# Patient Record
Sex: Female | Born: 1940 | Race: White | Hispanic: No | Marital: Married | Smoking: Never smoker
Health system: Southern US, Community
[De-identification: ages and names within clinical notes are randomized; demographics above are authoritative.]

## PROBLEM LIST (undated history)

## (undated) DIAGNOSIS — K2100 Gastro-esophageal reflux disease with esophagitis, without bleeding: Secondary | ICD-10-CM

## (undated) DIAGNOSIS — N3010 Interstitial cystitis (chronic) without hematuria: Secondary | ICD-10-CM

## (undated) DIAGNOSIS — Z860100 Personal history of colon polyps, unspecified: Secondary | ICD-10-CM

## (undated) DIAGNOSIS — K449 Diaphragmatic hernia without obstruction or gangrene: Secondary | ICD-10-CM

## (undated) DIAGNOSIS — I350 Nonrheumatic aortic (valve) stenosis: Secondary | ICD-10-CM

## (undated) DIAGNOSIS — I10 Essential (primary) hypertension: Secondary | ICD-10-CM

## (undated) DIAGNOSIS — E039 Hypothyroidism, unspecified: Secondary | ICD-10-CM

## (undated) DIAGNOSIS — M810 Age-related osteoporosis without current pathological fracture: Secondary | ICD-10-CM

## (undated) DIAGNOSIS — Z952 Presence of prosthetic heart valve: Secondary | ICD-10-CM

## (undated) DIAGNOSIS — E7800 Pure hypercholesterolemia, unspecified: Secondary | ICD-10-CM

## (undated) DIAGNOSIS — F419 Anxiety disorder, unspecified: Secondary | ICD-10-CM

## (undated) DIAGNOSIS — E559 Vitamin D deficiency, unspecified: Secondary | ICD-10-CM

## (undated) HISTORY — DX: Pure hypercholesterolemia, unspecified: E78.00

## (undated) HISTORY — PX: CHOLECYSTECTOMY: SHX55

## (undated) HISTORY — DX: Personal history of colonic polyps: Z86.010

## (undated) HISTORY — DX: Age-related osteoporosis without current pathological fracture: M81.0

## (undated) HISTORY — DX: Gastro-esophageal reflux disease with esophagitis, without bleeding: K21.00

## (undated) HISTORY — DX: Anxiety disorder, unspecified: F41.9

## (undated) HISTORY — DX: Vitamin D deficiency, unspecified: E55.9

## (undated) HISTORY — DX: Diaphragmatic hernia without obstruction or gangrene: K44.9

## (undated) HISTORY — DX: Gastro-esophageal reflux disease with esophagitis: K21.0

## (undated) HISTORY — DX: Essential (primary) hypertension: I10

## (undated) HISTORY — DX: Interstitial cystitis (chronic) without hematuria: N30.10

## (undated) HISTORY — PX: TUBAL LIGATION: SHX77

## (undated) HISTORY — PX: FOOT SURGERY: SHX648

## (undated) HISTORY — DX: Hypothyroidism, unspecified: E03.9

## (undated) HISTORY — DX: Personal history of colon polyps, unspecified: Z86.0100

---

## 2011-09-18 ENCOUNTER — Encounter: Payer: Self-pay | Admitting: Gastroenterology

## 2011-09-18 HISTORY — PX: COLONOSCOPY: SHX174

## 2013-02-04 ENCOUNTER — Encounter (INDEPENDENT_AMBULATORY_CARE_PROVIDER_SITE_OTHER): Payer: Self-pay

## 2013-02-04 ENCOUNTER — Ambulatory Visit (INDEPENDENT_AMBULATORY_CARE_PROVIDER_SITE_OTHER): Payer: Medicare PPO

## 2013-02-04 DIAGNOSIS — M2040 Other hammer toe(s) (acquired), unspecified foot: Secondary | ICD-10-CM

## 2013-02-04 DIAGNOSIS — M79609 Pain in unspecified limb: Secondary | ICD-10-CM

## 2013-02-04 DIAGNOSIS — M2042 Other hammer toe(s) (acquired), left foot: Secondary | ICD-10-CM

## 2013-02-04 NOTE — Progress Notes (Signed)
  Subjective:    Patient ID: Shannon Mills, female    DOB: [birth year], 72 y.o.   MRN: [id number]  HPI left 2nd toe and has been a year and curling down and no burning and no throbing  Patient has had previous hammertoe repair of the second digit right foot with good success. Recently is noted she has a long second toe on the right foot which is curling down at the distal IP joint. Not painful or symptomatic. However may be irritated in certain shoes.   Review of Systems  HENT: Positive for sinus pressure.   Endocrine: Positive for polyuria.  Neurological: Positive for headaches.       Objective:   Physical Exam Neurovascular status is intact. Pedal pulses palpable DP and PT +2/4 bilateral. Skin color normal. Hair growth diminished absent bilateral. Mild varicosities noted bilateral. No edema noted. Epicritic and proprioceptive sensations intact and symmetric bilateral. Nails are normal. Orthopedic exam reveals rectus hallux with some slight deviation mild bunion. 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 Confirmed with x-ray evaluation. Recommend accommodative shoes and she is currently wearing. Dispensed some tube foam padding. Followup in the future an as-needed basis.  Alvan Dame DPM

## 2013-02-04 NOTE — Patient Instructions (Signed)
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 Currently the toes flexible and nonpainful. This is a variation of water often termed as hammertoe deformities. These are often hereditary nature, and can worsen over time. Although not caused by shoes, certain shoes can aggravate the condition. If the toe becomes painful or symptomatic further evaluation and possible surgery may be warranted.

## 2015-06-19 DIAGNOSIS — N3010 Interstitial cystitis (chronic) without hematuria: Secondary | ICD-10-CM | POA: Insufficient documentation

## 2015-06-19 HISTORY — DX: Interstitial cystitis (chronic) without hematuria: N30.10

## 2016-12-16 DIAGNOSIS — I350 Nonrheumatic aortic (valve) stenosis: Secondary | ICD-10-CM | POA: Diagnosis not present

## 2017-05-05 DIAGNOSIS — B029 Zoster without complications: Secondary | ICD-10-CM | POA: Diagnosis not present

## 2017-05-05 DIAGNOSIS — Z6826 Body mass index (BMI) 26.0-26.9, adult: Secondary | ICD-10-CM | POA: Diagnosis not present

## 2017-05-05 DIAGNOSIS — I350 Nonrheumatic aortic (valve) stenosis: Secondary | ICD-10-CM | POA: Diagnosis not present

## 2017-05-05 DIAGNOSIS — E785 Hyperlipidemia, unspecified: Secondary | ICD-10-CM | POA: Diagnosis not present

## 2017-05-05 DIAGNOSIS — F419 Anxiety disorder, unspecified: Secondary | ICD-10-CM | POA: Diagnosis not present

## 2017-05-05 DIAGNOSIS — E063 Autoimmune thyroiditis: Secondary | ICD-10-CM | POA: Diagnosis not present

## 2017-05-05 DIAGNOSIS — N3010 Interstitial cystitis (chronic) without hematuria: Secondary | ICD-10-CM | POA: Diagnosis not present

## 2017-05-05 DIAGNOSIS — E559 Vitamin D deficiency, unspecified: Secondary | ICD-10-CM | POA: Diagnosis not present

## 2017-05-05 DIAGNOSIS — K210 Gastro-esophageal reflux disease with esophagitis: Secondary | ICD-10-CM | POA: Diagnosis not present

## 2017-05-05 DIAGNOSIS — I10 Essential (primary) hypertension: Secondary | ICD-10-CM | POA: Diagnosis not present

## 2017-09-14 DIAGNOSIS — R05 Cough: Secondary | ICD-10-CM | POA: Diagnosis not present

## 2017-09-14 DIAGNOSIS — J189 Pneumonia, unspecified organism: Secondary | ICD-10-CM | POA: Diagnosis not present

## 2017-09-14 DIAGNOSIS — M940 Chondrocostal junction syndrome [Tietze]: Secondary | ICD-10-CM | POA: Diagnosis not present

## 2017-09-25 DIAGNOSIS — J189 Pneumonia, unspecified organism: Secondary | ICD-10-CM | POA: Diagnosis not present

## 2017-09-25 DIAGNOSIS — Z1331 Encounter for screening for depression: Secondary | ICD-10-CM | POA: Diagnosis not present

## 2017-09-25 DIAGNOSIS — Z9181 History of falling: Secondary | ICD-10-CM | POA: Diagnosis not present

## 2017-09-25 DIAGNOSIS — J9801 Acute bronchospasm: Secondary | ICD-10-CM | POA: Diagnosis not present

## 2017-09-25 DIAGNOSIS — E785 Hyperlipidemia, unspecified: Secondary | ICD-10-CM | POA: Diagnosis not present

## 2017-09-25 DIAGNOSIS — E063 Autoimmune thyroiditis: Secondary | ICD-10-CM | POA: Diagnosis not present

## 2017-10-28 DIAGNOSIS — Z01419 Encounter for gynecological examination (general) (routine) without abnormal findings: Secondary | ICD-10-CM | POA: Diagnosis not present

## 2017-10-28 DIAGNOSIS — Z1239 Encounter for other screening for malignant neoplasm of breast: Secondary | ICD-10-CM | POA: Diagnosis not present

## 2017-11-21 DIAGNOSIS — Z1231 Encounter for screening mammogram for malignant neoplasm of breast: Secondary | ICD-10-CM | POA: Diagnosis not present

## 2017-12-17 DIAGNOSIS — H66009 Acute suppurative otitis media without spontaneous rupture of ear drum, unspecified ear: Secondary | ICD-10-CM | POA: Diagnosis not present

## 2018-01-07 DIAGNOSIS — Z23 Encounter for immunization: Secondary | ICD-10-CM | POA: Diagnosis not present

## 2018-01-25 DIAGNOSIS — J209 Acute bronchitis, unspecified: Secondary | ICD-10-CM | POA: Diagnosis not present

## 2018-01-25 DIAGNOSIS — H6122 Impacted cerumen, left ear: Secondary | ICD-10-CM | POA: Diagnosis not present

## 2018-01-29 ENCOUNTER — Encounter: Payer: Self-pay | Admitting: Cardiology

## 2018-01-29 ENCOUNTER — Ambulatory Visit (INDEPENDENT_AMBULATORY_CARE_PROVIDER_SITE_OTHER): Payer: Medicare Other | Admitting: Cardiology

## 2018-01-29 VITALS — BP 142/80 | HR 62 | Ht 62.5 in | Wt 154.0 lb

## 2018-01-29 DIAGNOSIS — R5383 Other fatigue: Secondary | ICD-10-CM

## 2018-01-29 DIAGNOSIS — I10 Essential (primary) hypertension: Secondary | ICD-10-CM

## 2018-01-29 DIAGNOSIS — I350 Nonrheumatic aortic (valve) stenosis: Secondary | ICD-10-CM | POA: Diagnosis not present

## 2018-01-29 DIAGNOSIS — E782 Mixed hyperlipidemia: Secondary | ICD-10-CM | POA: Diagnosis not present

## 2018-01-29 DIAGNOSIS — I700 Atherosclerosis of aorta: Secondary | ICD-10-CM | POA: Diagnosis not present

## 2018-01-29 DIAGNOSIS — R0602 Shortness of breath: Secondary | ICD-10-CM | POA: Diagnosis not present

## 2018-01-29 DIAGNOSIS — I209 Angina pectoris, unspecified: Secondary | ICD-10-CM | POA: Diagnosis not present

## 2018-01-29 HISTORY — DX: Essential (primary) hypertension: I10

## 2018-01-29 NOTE — H&P (View-Only) (Signed)
Cardiology Office Note:    Date:  01/29/2018   ID:  Shannon Mills, DOB [birth year], MRN [id number]  PCP:  [name]., MD  Cardiologist:  Jenean Lindau, MD   Referring MD: [name]., MD    ASSESSMENT:    1. Angina pectoris ([place])   2. Fatigue, unspecified type   3. Aortic valve stenosis, etiology of cardiac valve disease unspecified   4. Essential hypertension   5. Mixed dyslipidemia    PLAN:    In order of problems listed above:  1. Primary prevention stressed with the patient.  Importance of compliance with diet and medication stressed and she vocalized understanding.  Her blood pressure is stable. 2. Diet was discussed with dyslipidemia. 3. Her symptoms are very concerning to me.  She has had a stress test earlier in the past.In view of the patient's symptoms, I discussed with the patient options for evaluation. Invasive and noninvasive options were given to the patient. I discussed stress testing and coronary angiography and left heart catheterization at length. Benefits, pros and cons of each approach were discussed at length. Patient had multiple questions which were answered to the patient's satisfaction. Patient opted for invasive evaluation and we will set up for coronary angiography and left heart catheterization. Further recommendations will be made based on the findings with coronary angiography. In the interim if the patient has any significant symptoms in hospital to the nearest emergency room. 4. I think coronary angiography and left heart catheterization will help to assess that she has issues with the aortic valve and coexistent coronary artery disease causing the symptoms.  This will help clarify however evaluation and diagnosis in treating this patient's symptoms.  In view of the fact that she has significant aortic stenosis and advanced age I have not given her nitroglycerin prescription.  I told her not to exert herself until the aforementioned test.  Just go to  the nearest emergency room for any concerning symptoms.   Medication Adjustments/Labs and Tests Ordered: Current medicines are reviewed at length with the patient today.  Concerns regarding medicines are outlined above.  No orders of the defined types were placed in this encounter.  No orders of the defined types were placed in this encounter.    History of Present Illness:    Shannon Mills is a 77 y.o. female who is being seen today for the evaluation of chest pain on exertion suggesting angina and cardiac murmur at the request of [name]., MD.  Patient is a pleasant 77 year old female.  She has past medical history of essential hypertension, dyslipidemia and cardiac murmur.  She mentions to me that she has chest tightness on exertion which curtails her effort and shortness of breath also.  Her symptoms were getting worse and therefore she wanted to be evaluated by a cardiologist.  At the time of my evaluation, the patient is alert awake oriented and in no distress.  Past Medical History:  Diagnosis Date  . Anxiety   . Aortic stenosis   . Bladder problem   . Chronic reflux esophagitis   . Hypercholesterolemia   . Hypertension   . Hypothyroidism   . Osteoporosis   . Systolic murmur   . Vitamin D deficiency     Past Surgical History:  Procedure Laterality Date  . CHOLECYSTECTOMY    . FOOT SURGERY    . TUBAL LIGATION      Current Medications: Current Meds  Medication Sig  . aspirin EC 81 MG  tablet Take 81 mg by mouth daily.  Marland Kitchen escitalopram (LEXAPRO) 10 MG tablet Take 10 mg by mouth daily.  Marland Kitchen imipramine (TOFRANIL) 25 MG tablet Take 25 mg by mouth at bedtime.  Marland Kitchen levothyroxine (SYNTHROID, LEVOTHROID) 100 MCG tablet Take 0.05 mcg by mouth daily before breakfast.  . metoprolol succinate (TOPROL-XL) 25 MG 24 hr tablet Take 1 tablet by mouth daily.  . rosuvastatin (CRESTOR) 40 MG tablet Take 40 mg by mouth daily.  . [DISCONTINUED] imipramine (TOFRANIL) 25 MG tablet Take 25 mg  by mouth at bedtime.  . [DISCONTINUED] pentosan polysulfate (ELMIRON) 100 MG capsule Take 100 mg by mouth 3 (three) times daily.     Allergies:   Levaquin [levofloxacin]   Social History   Socioeconomic History  . Marital status: Married    Spouse name: Not on file  . Number of children: Not on file  . Years of education: Not on file  . Highest education level: Not on file  Occupational History  . Not on file  Social Needs  . Financial resource strain: Not on file  . Food insecurity:    Worry: Not on file    Inability: Not on file  . Transportation needs:    Medical: Not on file    Non-medical: Not on file  Tobacco Use  . Smoking status: Never Smoker  . Smokeless tobacco: Never Used  Substance and Sexual Activity  . Alcohol use: No  . Drug use: No  . Sexual activity: Not on file  Lifestyle  . Physical activity:    Days per week: Not on file    Minutes per session: Not on file  . Stress: Not on file  Relationships  . Social connections:    Talks on phone: Not on file    Gets together: Not on file    Attends religious service: Not on file    Active member of club or organization: Not on file    Attends meetings of clubs or organizations: Not on file    Relationship status: Not on file  Other Topics Concern  . Not on file  Social History Narrative  . Not on file     Family History: The patient's family history includes COPD in her father; Cancer in her brother; Dementia in her mother; Hypertension in her father.  ROS:   Please see the history of present illness.    All other systems reviewed and are negative.  EKGs/Labs/Other Studies Reviewed:    The following studies were reviewed today: EKG reveals sinus rhythm and nonspecific ST-T changes.  Echocardiogram done on 12/09/2016 reveals mild concentric hypertrophy with normal systolic function and moderate stenosis with a mean gradient of 35 mmHg and mild aortic insufficiency.   Recent Labs: No results found  for requested labs within last 8760 hours.  Recent Lipid Panel No results found for: CHOL, TRIG, HDL, CHOLHDL, VLDL, LDLCALC, LDLDIRECT  Physical Exam:    VS:  BP (!) 142/80 (BP Location: Right Arm, Patient Position: Sitting, Cuff Size: Normal)   Pulse 62   Ht 5' 2.5" (1.588 m)   Wt 154 lb (69.9 kg)   SpO2 98%   BMI 27.72 kg/m     Wt Readings from Last 3 Encounters:  01/29/18 154 lb (69.9 kg)     GEN: Patient is in no acute distress HEENT: Normal NECK: No JVD; No carotid bruits LYMPHATICS: No lymphadenopathy CARDIAC: S1 S2 regular, 2/6 systolic murmur at the apex. RESPIRATORY:  Clear to auscultation without rales,  wheezing or rhonchi  ABDOMEN: Soft, non-tender, non-distended MUSCULOSKELETAL:  No edema; No deformity  SKIN: Warm and dry NEUROLOGIC:  Alert and oriented x 3 PSYCHIATRIC:  Normal affect    Signed, Jenean Lindau, MD  01/29/2018 3:09 PM    [facility]

## 2018-01-29 NOTE — Addendum Note (Signed)
Addended by: Mattie Marlin on: 01/29/2018 03:29 PM   Modules accepted: Orders

## 2018-01-29 NOTE — Patient Instructions (Signed)
Medication Instructions:  Your physician recommends that you continue on your current medications as directed. Please refer to the Current Medication list given to you today.  If you need a refill on your cardiac medications before your next appointment, please call your pharmacy.   Lab work: Your physician recommends that you have the following labs drawn: BMP and CBC today.  If you have labs (blood work) drawn today and your tests are completely normal, you will receive your results only by: Marland Kitchen MyChart Message (if you have MyChart) OR . A paper copy in the mail If you have any lab test that is abnormal or we need to change your treatment, we will call you to review the results.  Testing/Procedures: A chest x-ray takes a picture of the organs and structures inside the chest, including the heart, lungs, and blood vessels. This test can show several things, including, whether the heart is enlarges; whether fluid is building up in the lungs; and whether pacemaker / defibrillator leads are still in place.     [place] [address] Dept: [phone] Loc: [phone]  Shannon Mills  01/29/2018  You are scheduled for a Cardiac Catheterization on Tuesday, October 15 with Dr. Peter Martinique.  1. Please arrive at the [facility] (Main Entrance A) at [facility]: [address] [place], [place] [address] at 5:30 AM (This time is two hours before your procedure to ensure your preparation). Free valet parking service is available.   Special note: Every effort is made to have your procedure done on time. Please understand that emergencies sometimes delay scheduled procedures.  2. Diet: Do not eat solid foods after midnight.  The patient may have clear liquids until 5am upon the day of the procedure.  3. Labs: Done today.  4. Medication instructions in preparation for your  procedure:   Contrast Allergy: No  On the morning of your procedure, take your Aspirin and any morning medicines NOT listed above.  You may use sips of water.  5. Plan for one night stay--bring personal belongings. 6. Bring a current list of your medications and current insurance cards. 7. You MUST have a responsible person to drive you home. 8. Someone MUST be with you the first 24 hours after you arrive home or your discharge will be delayed. 9. Please wear clothes that are easy to get on and off and wear slip-on shoes.  Thank you for allowing [place] to care for you!   -- [facility]   Follow-Up: At [facility], you and your health needs are our priority.  As part of our continuing mission to provide you with exceptional heart care, we have created designated Provider Care Teams.  These Care Teams include your primary Cardiologist (physician) and Advanced Practice Providers (APPs -  Physician Assistants and Nurse Practitioners) who all work together to provide you with the care you need, when you need it.  You will need a follow up appointment in 4 weeks.  Please call our office 2 months in advance to schedule this appointment.  You may see another member of our [employer] Provider Team in [place]: Jenne Campus, MD . [name], MD  Any Other Special Instructions Will Be Listed Below (If Applicable).   Coronary Angiogram With Stent Coronary angiogram with stent placement is a procedure to widen or open a narrow blood vessel of the heart (coronary artery). Arteries  may become blocked by cholesterol buildup (plaques) in the lining of the wall. When a coronary artery becomes partially blocked, blood flow to that area decreases. This may lead to chest pain or a heart attack (myocardial infarction). A stent is a small piece of metal that looks like mesh or a spring. Stent placement may be done as treatment for a heart attack or right after a  coronary angiogram in which a blocked artery is found. Let your health care provider know about:  Any allergies you have.  All medicines you are taking, including vitamins, herbs, eye drops, creams, and over-the-counter medicines.  Any problems you or family members have had with anesthetic medicines.  Any blood disorders you have.  Any surgeries you have had.  Any medical conditions you have.  Whether you are pregnant or may be pregnant. What are the risks? Generally, this is a safe procedure. However, problems may occur, including:  Damage to the heart or its blood vessels.  A return of blockage.  Bleeding, infection, or bruising at the insertion site.  A collection of blood under the skin (hematoma) at the insertion site.  A blood clot in another part of the body.  Kidney injury.  Allergic reaction to the dye or contrast that is used.  Bleeding into the abdomen (retroperitoneal bleeding).  What happens before the procedure? Staying hydrated Follow instructions from your health care provider about hydration, which may include:  Up to 2 hours before the procedure - you may continue to drink clear liquids, such as water, clear fruit juice, black coffee, and plain tea.  Eating and drinking restrictions Follow instructions from your health care provider about eating and drinking, which may include:  8 hours before the procedure - stop eating heavy meals or foods such as meat, fried foods, or fatty foods.  6 hours before the procedure - stop eating light meals or foods, such as toast or cereal.  2 hours before the procedure - stop drinking clear liquids.  Ask your health care provider about:  Changing or stopping your regular medicines. This is especially important if you are taking diabetes medicines or blood thinners.  Taking medicines such as ibuprofen. These medicines can thin your blood. Do not take these medicines before your procedure if your health care  provider instructs you not to. Generally, aspirin is recommended before a procedure of passing a small, thin tube (catheter) through a blood vessel and into the heart (cardiac catheterization).  What happens during the procedure?  An IV tube will be inserted into one of your veins.  You will be given one or more of the following: ? A medicine to help you relax (sedative). ? A medicine to numb the area where the catheter will be inserted into an artery (local anesthetic).  To reduce your risk of infection: ? Your health care team will wash or sanitize their hands. ? Your skin will be washed with soap. ? Hair may be removed from the area where the catheter will be inserted.  Using a guide wire, the catheter will be inserted into an artery. The location may be in your groin, in your wrist, or in the fold of your arm (near your elbow).  A type of X-ray (fluoroscopy) will be used to help guide the catheter to the opening of the arteries in the heart.  A dye will be injected into the catheter, and X-rays will be taken. The dye will help to show where any narrowing or blockages are  located in the arteries.  A tiny wire will be guided to the blocked spot, and a balloon will be inflated to make the artery wider.  The stent will be expanded and will crush the plaques into the wall of the vessel. The stent will hold the area open and improve the blood flow. Most stents have a drug coating to reduce the risk of the stent narrowing over time.  The artery may be made wider using a drill, laser, or other tools to remove plaques.  When the blood flow is better, the catheter will be removed. The lining of the artery will grow over the stent, which stays where it was placed. This procedure may vary among health care providers and hospitals. What happens after the procedure?  If the procedure is done through the leg, you will be kept in bed lying flat for about 6 hours. You will be instructed to not bend  and not cross your legs.  The insertion site will be checked frequently.  The pulse in your foot or wrist will be checked frequently.  You may have additional blood tests, X-rays, and a test that records the electrical activity of your heart (electrocardiogram, or ECG). This information is not intended to replace advice given to you by your health care provider. Make sure you discuss any questions you have with your health care provider. Document Released: 10/13/2002 Document Revised: 12/07/2015 Document Reviewed: 11/12/2015 Elsevier Interactive Patient Education  [employer].

## 2018-01-29 NOTE — Progress Notes (Signed)
Cardiology Office Note:    Date:  01/29/2018   ID:  Shannon Mills, DOB 11-06-1940, MRN [id number]  PCP:  Shannon Doyne., MD  Cardiologist:  Shannon Lindau, MD   Referring MD: Shannon Doyne., MD    ASSESSMENT:    1. Angina pectoris ([place])   2. Fatigue, unspecified type   3. Aortic valve stenosis, etiology of cardiac valve disease unspecified   4. Essential hypertension   5. Mixed dyslipidemia    PLAN:    In order of problems listed above:  1. Primary prevention stressed with the patient.  Importance of compliance with diet and medication stressed and she vocalized understanding.  Her blood pressure is stable. 2. Diet was discussed with dyslipidemia. 3. Her symptoms are very concerning to me.  She has had a stress test earlier in the past.In view of the patient's symptoms, I discussed with the patient options for evaluation. Invasive and noninvasive options were given to the patient. I discussed stress testing and coronary angiography and left heart catheterization at length. Benefits, pros and cons of each approach were discussed at length. Patient had multiple questions which were answered to the patient's satisfaction. Patient opted for invasive evaluation and we will set up for coronary angiography and left heart catheterization. Further recommendations will be made based on the findings with coronary angiography. In the interim if the patient has any significant symptoms in hospital to the nearest emergency room. 4. I think coronary angiography and left heart catheterization will help to assess that she has issues with the aortic valve and coexistent coronary artery disease causing the symptoms.  This will help clarify however evaluation and diagnosis in treating this patient's symptoms.  In view of the fact that she has significant aortic stenosis and advanced age I have not given her nitroglycerin prescription.  I told her not to exert herself until the aforementioned test.  Just go to  the nearest emergency room for any concerning symptoms.   Medication Adjustments/Labs and Tests Ordered: Current medicines are reviewed at length with the patient today.  Concerns regarding medicines are outlined above.  No orders of the defined types were placed in this encounter.  No orders of the defined types were placed in this encounter.    History of Present Illness:    Shannon Mills is a 77 y.o. female who is being seen today for the evaluation of chest pain on exertion suggesting angina and cardiac murmur at the request of Shannon Doyne., MD.  Patient is a pleasant 77 year old female.  She has past medical history of essential hypertension, dyslipidemia and cardiac murmur.  She mentions to me that she has chest tightness on exertion which curtails her effort and shortness of breath also.  Her symptoms were getting worse and therefore she wanted to be evaluated by a cardiologist.  At the time of my evaluation, the patient is alert awake oriented and in no distress.  Past Medical History:  Diagnosis Date  . Anxiety   . Aortic stenosis   . Bladder problem   . Chronic reflux esophagitis   . Hypercholesterolemia   . Hypertension   . Hypothyroidism   . Osteoporosis   . Systolic murmur   . Vitamin D deficiency     Past Surgical History:  Procedure Laterality Date  . CHOLECYSTECTOMY    . FOOT SURGERY    . TUBAL LIGATION      Current Medications: Current Meds  Medication Sig  . aspirin EC 81 MG  tablet Take 81 mg by mouth daily.  Marland Kitchen escitalopram (LEXAPRO) 10 MG tablet Take 10 mg by mouth daily.  Marland Kitchen imipramine (TOFRANIL) 25 MG tablet Take 25 mg by mouth at bedtime.  Marland Kitchen levothyroxine (SYNTHROID, LEVOTHROID) 100 MCG tablet Take 0.05 mcg by mouth daily before breakfast.  . metoprolol succinate (TOPROL-XL) 25 MG 24 hr tablet Take 1 tablet by mouth daily.  . rosuvastatin (CRESTOR) 40 MG tablet Take 40 mg by mouth daily.  . [DISCONTINUED] imipramine (TOFRANIL) 25 MG tablet Take 25 mg  by mouth at bedtime.  . [DISCONTINUED] pentosan polysulfate (ELMIRON) 100 MG capsule Take 100 mg by mouth 3 (three) times daily.     Allergies:   Levaquin [levofloxacin]   Social History   Socioeconomic History  . Marital status: Married    Spouse name: Not on file  . Number of children: Not on file  . Years of education: Not on file  . Highest education level: Not on file  Occupational History  . Not on file  Social Needs  . Financial resource strain: Not on file  . Food insecurity:    Worry: Not on file    Inability: Not on file  . Transportation needs:    Medical: Not on file    Non-medical: Not on file  Tobacco Use  . Smoking status: Never Smoker  . Smokeless tobacco: Never Used  Substance and Sexual Activity  . Alcohol use: No  . Drug use: No  . Sexual activity: Not on file  Lifestyle  . Physical activity:    Days per week: Not on file    Minutes per session: Not on file  . Stress: Not on file  Relationships  . Social connections:    Talks on phone: Not on file    Gets together: Not on file    Attends religious service: Not on file    Active member of club or organization: Not on file    Attends meetings of clubs or organizations: Not on file    Relationship status: Not on file  Other Topics Concern  . Not on file  Social History Narrative  . Not on file     Family History: The patient's family history includes COPD in her father; Cancer in her brother; Dementia in her mother; Hypertension in her father.  ROS:   Please see the history of present illness.    All other systems reviewed and are negative.  EKGs/Labs/Other Studies Reviewed:    The following studies were reviewed today: EKG reveals sinus rhythm and nonspecific ST-T changes.  Echocardiogram done on 12/09/2016 reveals mild concentric hypertrophy with normal systolic function and moderate stenosis with a mean gradient of 35 mmHg and mild aortic insufficiency.   Recent Labs: No results found  for requested labs within last 8760 hours.  Recent Lipid Panel No results found for: CHOL, TRIG, HDL, CHOLHDL, VLDL, LDLCALC, LDLDIRECT  Physical Exam:    VS:  BP (!) 142/80 (BP Location: Right Arm, Patient Position: Sitting, Cuff Size: Normal)   Pulse 62   Ht 5' 2.5" (1.588 m)   Wt 154 lb (69.9 kg)   SpO2 98%   BMI 27.72 kg/m     Wt Readings from Last 3 Encounters:  01/29/18 154 lb (69.9 kg)     GEN: Patient is in no acute distress HEENT: Normal NECK: No JVD; No carotid bruits LYMPHATICS: No lymphadenopathy CARDIAC: S1 S2 regular, 2/6 systolic murmur at the apex. RESPIRATORY:  Clear to auscultation without rales,  wheezing or rhonchi  ABDOMEN: Soft, non-tender, non-distended MUSCULOSKELETAL:  No edema; No deformity  SKIN: Warm and dry NEUROLOGIC:  Alert and oriented x 3 PSYCHIATRIC:  Normal affect    Signed, Shannon Lindau, MD  01/29/2018 3:09 PM    [facility]

## 2018-01-30 ENCOUNTER — Telehealth: Payer: Self-pay

## 2018-01-30 LAB — CBC WITH DIFFERENTIAL/PLATELET
BASOS ABS: 0 10*3/uL (ref 0.0–0.2)
Basos: 1 %
EOS (ABSOLUTE): 0.1 10*3/uL (ref 0.0–0.4)
Eos: 1 %
HEMATOCRIT: 41.7 % (ref 34.0–46.6)
Hemoglobin: 14 g/dL (ref 11.1–15.9)
Immature Grans (Abs): 0 10*3/uL (ref 0.0–0.1)
Immature Granulocytes: 0 %
Lymphocytes Absolute: 1.7 10*3/uL (ref 0.7–3.1)
Lymphs: 27 %
MCH: 29.8 pg (ref 26.6–33.0)
MCHC: 33.6 g/dL (ref 31.5–35.7)
MCV: 89 fL (ref 79–97)
MONOS ABS: 0.5 10*3/uL (ref 0.1–0.9)
Monocytes: 8 %
Neutrophils Absolute: 3.9 10*3/uL (ref 1.4–7.0)
Neutrophils: 63 %
Platelets: 184 10*3/uL (ref 150–450)
RBC: 4.7 x10E6/uL (ref 3.77–5.28)
RDW: 13 % (ref 12.3–15.4)
WBC: 6.2 10*3/uL (ref 3.4–10.8)

## 2018-01-30 LAB — BASIC METABOLIC PANEL
BUN/Creatinine Ratio: 22 (ref 12–28)
BUN: 21 mg/dL (ref 8–27)
CALCIUM: 9.9 mg/dL (ref 8.7–10.3)
CHLORIDE: 101 mmol/L (ref 96–106)
CO2: 23 mmol/L (ref 20–29)
Creatinine, Ser: 0.94 mg/dL (ref 0.57–1.00)
GFR calc Af Amer: 68 mL/min/{1.73_m2} (ref >59)
GFR calc non Af Amer: 59 mL/min/{1.73_m2} — ABNORMAL LOW (ref >59)
Glucose: 89 mg/dL (ref 65–99)
POTASSIUM: 4 mmol/L (ref 3.5–5.2)
SODIUM: 139 mmol/L (ref 134–144)

## 2018-01-30 NOTE — Telephone Encounter (Signed)
-----   Message from Jenean Lindau, MD sent at 01/30/2018  9:17 AM EDT ----- The results of the study is unremarkable. Please inform patient. I will discuss in detail at next appointment. Cc  primary care/referring physician Jenean Lindau, MD 01/30/2018 9:17 AM

## 2018-01-30 NOTE — Telephone Encounter (Signed)
Patients results were normal so will mail results to patients home

## 2018-02-03 ENCOUNTER — Encounter (HOSPITAL_COMMUNITY): Payer: Self-pay | Admitting: Cardiology

## 2018-02-03 ENCOUNTER — Encounter (HOSPITAL_COMMUNITY)
Admission: RE | Disposition: A | Discharge status: Home / Self Care | Payer: Self-pay | Source: Ambulatory Visit | Attending: Cardiology

## 2018-02-03 ENCOUNTER — Other Ambulatory Visit: Payer: Self-pay

## 2018-02-03 ENCOUNTER — Ambulatory Visit (HOSPITAL_COMMUNITY)
Admission: RE | Admit: 2018-02-03 | Discharge: 2018-02-03 | Disposition: A | Discharge status: Home / Self Care | Payer: Medicare Other | Source: Ambulatory Visit | Attending: Cardiology | Admitting: Cardiology

## 2018-02-03 ENCOUNTER — Ambulatory Visit (HOSPITAL_BASED_OUTPATIENT_CLINIC_OR_DEPARTMENT_OTHER): Payer: Medicare Other

## 2018-02-03 DIAGNOSIS — I420 Dilated cardiomyopathy: Secondary | ICD-10-CM | POA: Diagnosis not present

## 2018-02-03 DIAGNOSIS — Z9851 Tubal ligation status: Secondary | ICD-10-CM | POA: Insufficient documentation

## 2018-02-03 DIAGNOSIS — I080 Rheumatic disorders of both mitral and aortic valves: Secondary | ICD-10-CM | POA: Insufficient documentation

## 2018-02-03 DIAGNOSIS — E782 Mixed hyperlipidemia: Secondary | ICD-10-CM | POA: Diagnosis not present

## 2018-02-03 DIAGNOSIS — R5383 Other fatigue: Secondary | ICD-10-CM | POA: Insufficient documentation

## 2018-02-03 DIAGNOSIS — Z881 Allergy status to other antibiotic agents status: Secondary | ICD-10-CM | POA: Insufficient documentation

## 2018-02-03 DIAGNOSIS — Z7982 Long term (current) use of aspirin: Secondary | ICD-10-CM | POA: Insufficient documentation

## 2018-02-03 DIAGNOSIS — I5030 Unspecified diastolic (congestive) heart failure: Secondary | ICD-10-CM | POA: Insufficient documentation

## 2018-02-03 DIAGNOSIS — M810 Age-related osteoporosis without current pathological fracture: Secondary | ICD-10-CM | POA: Diagnosis not present

## 2018-02-03 DIAGNOSIS — I200 Unstable angina: Secondary | ICD-10-CM | POA: Diagnosis not present

## 2018-02-03 DIAGNOSIS — I350 Nonrheumatic aortic (valve) stenosis: Secondary | ICD-10-CM | POA: Diagnosis not present

## 2018-02-03 DIAGNOSIS — E039 Hypothyroidism, unspecified: Secondary | ICD-10-CM | POA: Diagnosis not present

## 2018-02-03 DIAGNOSIS — Z8249 Family history of ischemic heart disease and other diseases of the circulatory system: Secondary | ICD-10-CM | POA: Diagnosis not present

## 2018-02-03 DIAGNOSIS — F419 Anxiety disorder, unspecified: Secondary | ICD-10-CM | POA: Diagnosis not present

## 2018-02-03 DIAGNOSIS — Z9889 Other specified postprocedural states: Secondary | ICD-10-CM | POA: Insufficient documentation

## 2018-02-03 DIAGNOSIS — E559 Vitamin D deficiency, unspecified: Secondary | ICD-10-CM | POA: Insufficient documentation

## 2018-02-03 DIAGNOSIS — Z79890 Hormone replacement therapy (postmenopausal): Secondary | ICD-10-CM | POA: Insufficient documentation

## 2018-02-03 DIAGNOSIS — I209 Angina pectoris, unspecified: Secondary | ICD-10-CM | POA: Diagnosis present

## 2018-02-03 DIAGNOSIS — I340 Nonrheumatic mitral (valve) insufficiency: Secondary | ICD-10-CM

## 2018-02-03 DIAGNOSIS — Z79899 Other long term (current) drug therapy: Secondary | ICD-10-CM | POA: Insufficient documentation

## 2018-02-03 DIAGNOSIS — I110 Hypertensive heart disease with heart failure: Secondary | ICD-10-CM | POA: Diagnosis not present

## 2018-02-03 DIAGNOSIS — I10 Essential (primary) hypertension: Secondary | ICD-10-CM | POA: Diagnosis present

## 2018-02-03 DIAGNOSIS — Z9049 Acquired absence of other specified parts of digestive tract: Secondary | ICD-10-CM | POA: Diagnosis not present

## 2018-02-03 DIAGNOSIS — R0602 Shortness of breath: Secondary | ICD-10-CM | POA: Diagnosis not present

## 2018-02-03 HISTORY — PX: RIGHT/LEFT HEART CATH AND CORONARY ANGIOGRAPHY: CATH118266

## 2018-02-03 LAB — POCT I-STAT 3, ART BLOOD GAS (G3+)
ACID-BASE DEFICIT: 2 mmol/L (ref 0.0–2.0)
Acid-base deficit: 1 mmol/L (ref 0.0–2.0)
Bicarbonate: 22.7 mmol/L (ref 20.0–28.0)
Bicarbonate: 24.8 mmol/L (ref 20.0–28.0)
O2 SAT: 70 %
O2 Saturation: 96 %
TCO2: 24 mmol/L (ref 22–32)
TCO2: 26 mmol/L (ref 22–32)
pCO2 arterial: 38.1 mmHg (ref 32.0–48.0)
pCO2 arterial: 42.6 mmHg (ref 32.0–48.0)
pH, Arterial: 7.374 (ref 7.350–7.450)
pH, Arterial: 7.382 (ref 7.350–7.450)
pO2, Arterial: 38 mmHg — CL (ref 83.0–108.0)
pO2, Arterial: 82 mmHg — ABNORMAL LOW (ref 83.0–108.0)

## 2018-02-03 LAB — ECHOCARDIOGRAM COMPLETE
HEIGHTINCHES: 62.5 in
WEIGHTICAEL: 2464 [oz_av]

## 2018-02-03 SURGERY — RIGHT/LEFT HEART CATH AND CORONARY ANGIOGRAPHY
Anesthesia: LOCAL

## 2018-02-03 MED ORDER — SODIUM CHLORIDE 0.9 % WEIGHT BASED INFUSION: 1.0000 mL/kg/h | INTRAVENOUS | Status: DC

## 2018-02-03 MED ORDER — ONDANSETRON HCL 4 MG/2ML IJ SOLN: 4.0000 mg | Freq: Four times a day (QID) | INTRAMUSCULAR | Status: DC | PRN

## 2018-02-03 MED ORDER — MIDAZOLAM HCL 5 MG/5ML IJ SOLN
INTRAMUSCULAR | Status: AC
  Filled 2018-02-03: qty 5

## 2018-02-03 MED ORDER — FENTANYL CITRATE (PF) 100 MCG/2ML IJ SOLN
INTRAMUSCULAR | Status: AC
  Filled 2018-02-03: qty 2

## 2018-02-03 MED ORDER — SODIUM CHLORIDE 0.9 % WEIGHT BASED INFUSION
3.0000 mL/kg/h | INTRAVENOUS | Status: AC
  Administered 2018-02-03: 3 mL/kg/h via INTRAVENOUS

## 2018-02-03 MED ORDER — HEPARIN SODIUM (PORCINE) 1000 UNIT/ML IJ SOLN
INTRAMUSCULAR | Status: DC | PRN
  Administered 2018-02-03: 3500 [IU] via INTRAVENOUS

## 2018-02-03 MED ORDER — SODIUM CHLORIDE 0.9% FLUSH: 3.0000 mL | INTRAVENOUS | Status: DC | PRN

## 2018-02-03 MED ORDER — SODIUM CHLORIDE 0.9 % WEIGHT BASED INFUSION: 1.0000 mL/kg/h | INTRAVENOUS | Status: AC

## 2018-02-03 MED ORDER — ASPIRIN 81 MG PO CHEW: 81.0000 mg | CHEWABLE_TABLET | ORAL | Status: AC

## 2018-02-03 MED ORDER — SODIUM CHLORIDE 0.9 % IV SOLN: 250.0000 mL | INTRAVENOUS | Status: DC | PRN

## 2018-02-03 MED ORDER — FENTANYL CITRATE (PF) 100 MCG/2ML IJ SOLN
INTRAMUSCULAR | Status: DC | PRN
  Administered 2018-02-03: 25 ug via INTRAVENOUS

## 2018-02-03 MED ORDER — LIDOCAINE HCL (PF) 1 % IJ SOLN
INTRAMUSCULAR | Status: DC | PRN
  Administered 2018-02-03 (×2): 2 mL

## 2018-02-03 MED ORDER — HEPARIN (PORCINE) IN NACL 1000-0.9 UT/500ML-% IV SOLN
INTRAVENOUS | Status: DC | PRN
  Administered 2018-02-03 (×2): 500 mL

## 2018-02-03 MED ORDER — IOHEXOL 350 MG/ML SOLN
INTRAVENOUS | Status: DC | PRN
  Administered 2018-02-03: 40 mL via INTRA_ARTERIAL

## 2018-02-03 MED ORDER — VERAPAMIL HCL 2.5 MG/ML IV SOLN
INTRAVENOUS | Status: DC | PRN
  Administered 2018-02-03: 10 mL via INTRA_ARTERIAL

## 2018-02-03 MED ORDER — SODIUM CHLORIDE 0.9% FLUSH: 3.0000 mL | Freq: Two times a day (BID) | INTRAVENOUS | Status: DC

## 2018-02-03 MED ORDER — LIDOCAINE HCL (PF) 1 % IJ SOLN
INTRAMUSCULAR | Status: AC
  Filled 2018-02-03: qty 30

## 2018-02-03 MED ORDER — MIDAZOLAM HCL 2 MG/2ML IJ SOLN
INTRAMUSCULAR | Status: DC | PRN
  Administered 2018-02-03: 1 mg via INTRAVENOUS

## 2018-02-03 MED ORDER — ACETAMINOPHEN 325 MG PO TABS: 650.0000 mg | ORAL_TABLET | ORAL | Status: DC | PRN

## 2018-02-03 MED ORDER — VERAPAMIL HCL 2.5 MG/ML IV SOLN
INTRAVENOUS | Status: AC
  Filled 2018-02-03: qty 2

## 2018-02-03 SURGICAL SUPPLY — 17 items
CATH BALLN WEDGE 5F 110CM (CATHETERS) ×2 IMPLANT
CATH INFINITI 5 FR JL3.5 (CATHETERS) ×2 IMPLANT
CATH INFINITI 5FR AL1 (CATHETERS) ×2 IMPLANT
CATH INFINITI JR4 5F (CATHETERS) ×2 IMPLANT
CATH LANGSTON DUAL LUM PIG 6FR (CATHETERS) ×2 IMPLANT
DEVICE RAD COMP TR BAND LRG (VASCULAR PRODUCTS) ×2 IMPLANT
GLIDESHEATH SLEND SS 6F .021 (SHEATH) ×2 IMPLANT
GUIDEWIRE INQWIRE 1.5J.035X260 (WIRE) ×1 IMPLANT
INQWIRE 1.5J .035X260CM (WIRE) ×2
KIT HEART LEFT (KITS) ×2 IMPLANT
PACK CARDIAC CATHETERIZATION (CUSTOM PROCEDURE TRAY) ×2 IMPLANT
SHEATH GLIDE SLENDER 4/5FR (SHEATH) ×2 IMPLANT
TRANSDUCER W/STOPCOCK (MISCELLANEOUS) ×4 IMPLANT
TUBING ART PRESS 72  MALE/FEM (TUBING) ×1
TUBING ART PRESS 72 MALE/FEM (TUBING) ×1 IMPLANT
TUBING CIL FLEX 10 FLL-RA (TUBING) ×2 IMPLANT
WIRE EMERALD ST .035X260CM (WIRE) ×2 IMPLANT

## 2018-02-03 NOTE — Progress Notes (Signed)
  Echocardiogram 2D Echocardiogram has been performed.  Shannon Mills M 02/03/2018, 8:23 AM

## 2018-02-03 NOTE — Interval H&P Note (Signed)
History and Physical Interval Note:  02/03/2018 9:30 AM  Shannon Mills  has presented today for surgery, with the diagnosis of Angina  The various methods of treatment have been discussed with the patient and family. After consideration of risks, benefits and other options for treatment, the patient has consented to  Procedure(s): LEFT HEART CATH AND CORONARY ANGIOGRAPHY (N/A) as a surgical intervention .  The patient's history has been reviewed, patient examined, no change in status, stable for surgery.  I have reviewed the patient's chart and labs.  Questions were answered to the patient's satisfaction.    On exam and Echo done today patient has severe Aortic stenosis. Will perform right heart cath as well.  Collier [facility] 02/03/2018 9:31 AM

## 2018-02-03 NOTE — Discharge Instructions (Signed)

## 2018-02-04 ENCOUNTER — Other Ambulatory Visit: Payer: Self-pay

## 2018-02-04 DIAGNOSIS — I350 Nonrheumatic aortic (valve) stenosis: Secondary | ICD-10-CM

## 2018-02-04 DIAGNOSIS — R0602 Shortness of breath: Secondary | ICD-10-CM

## 2018-02-04 DIAGNOSIS — R0789 Other chest pain: Secondary | ICD-10-CM

## 2018-02-05 ENCOUNTER — Encounter: Payer: Self-pay | Admitting: Cardiovascular Disease

## 2018-02-05 ENCOUNTER — Ambulatory Visit (INDEPENDENT_AMBULATORY_CARE_PROVIDER_SITE_OTHER): Payer: Medicare Other | Admitting: Cardiovascular Disease

## 2018-02-05 VITALS — BP 120/82 | HR 74 | Ht 62.5 in | Wt 153.2 lb

## 2018-02-05 DIAGNOSIS — I209 Angina pectoris, unspecified: Secondary | ICD-10-CM

## 2018-02-05 DIAGNOSIS — I350 Nonrheumatic aortic (valve) stenosis: Secondary | ICD-10-CM | POA: Diagnosis not present

## 2018-02-05 NOTE — Progress Notes (Signed)
Valve Clinic Consult Note  Chief Complaint  Patient presents with  . New Patient (Initial Visit)    severe aortic stenosis   History of Present Illness: 77 yo female with history of HTN, hyperlipidemia, hypothyroidism, GERD, anxiety and severe aortic stenosis who is here as a new patient in the valve clinic, referred by Dr. Geraldo Pitter, for further discussion regarding her aortic stenosis. She was recently seen by Dr. Geraldo Pitter and c/o chest tightness and dyspnea. Echo 02/03/18 with normal LV size and normal LV systolic function with YBWL=89-37%. There was grade 1 diastolic dysfunction. The aortic valve is thickened and calcified. Mean gradient 43 mmHg, peak gradient 74 mmHg, AVA0.8 cm2, DVI 0.28. Mild mitral regurgitation. Cardia cath 02/03/18 with no evidence of CAD. Mean gradient of 33 mmHg by cath with AVA 0.8 cm2.   She tells me today that she has been having chest tightness with moderate exertion. She also has had dyspnea with exertion. No dizziness, near syncope or syncope. No lower extremity edema or fluid retention. She lives in a house with her husband in [place], [place] near [place]. She is retired. She goes to the dentist every year. No active tooth issues. She is very active and works in the yard and around her house. She has been limited over the past few months with the dyspnea and chest pressure.   Primary Care Physician: [name]., MD Referring Cardiologist:  Primary Cardiologist:   Past Medical History:  Diagnosis Date  . Anxiety   . Aortic stenosis   . Bladder problem   . Chronic reflux esophagitis   . Hypercholesterolemia   . Hypertension   . Hypothyroidism   . Osteoporosis   . Systolic murmur   . Vitamin D deficiency     Past Surgical History:  Procedure Laterality Date  . CHOLECYSTECTOMY    . FOOT SURGERY    . RIGHT/LEFT HEART CATH AND CORONARY ANGIOGRAPHY N/A 02/03/2018   Procedure: RIGHT/LEFT HEART CATH AND CORONARY ANGIOGRAPHY;  Surgeon: Martinique, Peter M, MD;   Location: [facility];  Service: Cardiovascular;  Laterality: N/A;  . TUBAL LIGATION      Current Outpatient Medications  Medication Sig Dispense Refill  . aspirin EC 81 MG tablet Take 81 mg by mouth daily.    . Aspirin-Acetaminophen-Caffeine (GOODY HEADACHE PO) Take 1 packet by mouth daily as needed (headaches).    . cetirizine (ZYRTEC) 10 MG tablet Take 10 mg by mouth at bedtime.    . cholecalciferol (VITAMIN D) 1000 units tablet Take 1,000 Units by mouth daily.    Marland Kitchen escitalopram (LEXAPRO) 10 MG tablet Take 10 mg by mouth at bedtime.     Marland Kitchen levothyroxine (SYNTHROID, LEVOTHROID) 50 MCG tablet Take 50 mcg by mouth daily before breakfast.    . metoprolol succinate (TOPROL-XL) 25 MG 24 hr tablet Take 25 mg by mouth at bedtime.   3  . omeprazole (PRILOSEC) 40 MG capsule Take 40 mg by mouth daily. Take around 1300    . rosuvastatin (CRESTOR) 40 MG tablet Take 40 mg by mouth at bedtime.      No current facility-administered medications for this visit.     Allergies  Allergen Reactions  . Levaquin [Levofloxacin]     Makes her sick    Social History   Socioeconomic History  . Marital status: Married    Spouse name: Not on file  . Number of children: 2  . Years of education: Not on file  . Highest education level: Not on  file  Occupational History  . Occupation: Retired-Office Research scientist (medical)  . Financial resource strain: Not on file  . Food insecurity:    Worry: Not on file    Inability: Not on file  . Transportation needs:    Medical: Not on file    Non-medical: Not on file  Tobacco Use  . Smoking status: Never Smoker  . Smokeless tobacco: Never Used  Substance and Sexual Activity  . Alcohol use: No  . Drug use: No  . Sexual activity: Not on file  Lifestyle  . Physical activity:    Days per week: Not on file    Minutes per session: Not on file  . Stress: Not on file  Relationships  . Social connections:    Talks on phone: Not on file    Gets together:  Not on file    Attends religious service: Not on file    Active member of club or organization: Not on file    Attends meetings of clubs or organizations: Not on file    Relationship status: Not on file  . Intimate partner violence:    Fear of current or ex partner: Not on file    Emotionally abused: Not on file    Physically abused: Not on file    Forced sexual activity: Not on file  Other Topics Concern  . Not on file  Social History Narrative  . Not on file    Family History  Problem Relation Age of Onset  . Dementia Mother   . COPD Father   . Throat cancer Brother     Review of Systems:  As stated in the HPI and otherwise negative.   BP 120/82   Pulse 74   Ht 5' 2.5" (1.588 m)   Wt 153 lb 3.2 oz (69.5 kg)   SpO2 97%   BMI 27.57 kg/m   Physical Examination: General: Well developed, well nourished, NAD  HEENT: OP clear, mucus membranes moist  SKIN: warm, dry. No rashes. Neuro: No focal deficits  Musculoskeletal: Muscle strength 5/5 all ext  Psychiatric: Mood and affect normal  Neck: No JVD, no carotid bruits, no thyromegaly, no lymphadenopathy.  Lungs:Clear bilaterally, no wheezes, rhonci, crackles Cardiovascular: Regular rate and rhythm. Loud, harsh systolic murmur.  Abdomen:Soft. Bowel sounds present. Non-tender.  Extremities: No lower extremity edema. Pulses are 2 + in the bilateral DP/PT.  Echo 02/03/18: Left ventricle: The cavity size was normal. Wall thickness was   normal. Systolic function was normal. The estimated ejection   fraction was in the range of 60% to 65%. Wall motion was normal;   there were no regional wall motion abnormalities. Doppler   parameters are consistent with abnormal left ventricular   relaxation (grade 1 diastolic dysfunction). Doppler parameters   are consistent with elevated mean left atrial filling pressure. - Aortic valve: Right coronary and left coronary cusp mobility was   severely restricted. There was severe stenosis.  There was trivial   regurgitation. Valve area (VTI): 0.88 cm^2. Valve area (Vmax):   0.8 cm^2. Valve area (Vmean): 0.8 cm^2. - Mitral valve: Calcified annulus. There was mild regurgitation.   Valve area by pressure half-time: 2.1 cm^2. - Left atrium: The atrium was mildly dilated.  ------------------------------------------------------------------- Study data:  No prior study was available for comparison.  Study status:  STAT.  Procedure:  The patient reported no pain pre or post test. Transthoracic echocardiography. Image quality was adequate.  Study completion:  There were no complications. Transthoracic  echocardiography.  M-mode, complete 2D, spectral Doppler, and color Doppler.  Birthdate:  Patient birthdate: July 11, 1940.  Age:  Patient is 77 yr old.  Sex:  Gender: female. BMI: 27.7 kg/m^2.  Blood pressure:     156/78  Patient status: Inpatient.  Study date:  Study date: 02/03/2018. Study time: 07:31 AM.  Location:  Catheterization laboratory.  -------------------------------------------------------------------  ------------------------------------------------------------------- Left ventricle:  The cavity size was normal. Wall thickness was normal. Systolic function was normal. The estimated ejection fraction was in the range of 60% to 65%. Wall motion was normal; there were no regional wall motion abnormalities. Doppler parameters are consistent with abnormal left ventricular relaxation (grade 1 diastolic dysfunction). Doppler parameters are consistent with elevated mean left atrial filling pressure.  ------------------------------------------------------------------- Aortic valve:   Trileaflet; severely thickened, moderately calcified leaflets.  Right coronary and left coronary cusp mobility was severely restricted.  Doppler:   There was severe stenosis. There was trivial regurgitation.    VTI ratio of LVOT to aortic valve: 0.31. Valve area (VTI): 0.88 cm^2. Indexed valve  area (VTI): 0.5 cm^2/m^2. Peak velocity ratio of LVOT to aortic valve: 0.28. Valve area (Vmax): 0.8 cm^2. Indexed valve area (Vmax): 0.45 cm^2/m^2. Mean velocity ratio of LVOT to aortic valve: 0.28. Valve area (Vmean): 0.8 cm^2. Indexed valve area (Vmean): 0.45 cm^2/m^2.   Mean gradient (S): 43 mm Hg. Peak gradient (S): 74 mm Hg.  ------------------------------------------------------------------- Aorta:  Aortic root: The aortic root was normal in size. Ascending aorta: The ascending aorta was normal in size.  ------------------------------------------------------------------- Mitral valve:   Calcified annulus. Leaflet separation was normal. Doppler:  Transvalvular velocity was within the normal range. There was no evidence for stenosis. There was mild regurgitation. Valve area by pressure half-time: 2.1 cm^2. Indexed valve area by pressure half-time: 1.18 cm^2/m^2.    Peak gradient (D): 6 mm Hg.   ------------------------------------------------------------------- Left atrium:  The atrium was mildly dilated.  ------------------------------------------------------------------- Right ventricle:  The cavity size was normal. Systolic function was normal.  ------------------------------------------------------------------- Pulmonic valve:    Structurally normal valve.   Cusp separation was normal.  Doppler:  Transvalvular velocity was within the normal range. There was no regurgitation.  ------------------------------------------------------------------- Tricuspid valve:   Structurally normal valve.   Leaflet separation was normal.  Doppler:  Transvalvular velocity was within the normal range. There was trivial regurgitation.  ------------------------------------------------------------------- Right atrium:  The atrium was normal in size.  ------------------------------------------------------------------- Pericardium:  There was no pericardial  effusion.  ------------------------------------------------------------------- Measurements   Left ventricle                           Value           Reference  LV ID, ED, PLAX chordal          (L)     36     mm       43 - 52  LV ID, ES, PLAX chordal                  24     mm       23 - 38  LV fx shortening, PLAX chordal           33     %        >=29  LV PW thickness, ED                      7  mm       ---------  IVS/LV PW ratio, ED              (H)     1.57            <=1.3  Stroke volume, 2D                        90     ml       ---------  Stroke volume/bsa, 2D                    51     ml/m^2   ---------  LV e&', lateral                           5.87   cm/s     ---------  LV E/e&', lateral                         20.1            ---------  LV e&', medial                            3.59   cm/s     ---------  LV E/e&', medial                          32.87           ---------  LV e&', average                           4.73   cm/s     ---------  LV E/e&', average                         24.95           ---------    Ventricular septum                       Value           Reference  IVS thickness, ED                        11.01  mm       ---------    LVOT                                     Value           Reference  LVOT ID, S                               19     mm       ---------  LVOT area                                2.84   cm^2     ---------  LVOT peak velocity, S                    121.06 cm/s     ---------  LVOT mean velocity, S                    86.6   cm/s     ---------  LVOT VTI, S                              27.95  cm       ---------  LVOT peak gradient, S                    6      mm Hg    ---------  Stroke volume (SV), LVOT DP              79.3   ml       ---------  Stroke index (SV/bsa), LVOT DP           44.6   ml/m^2   ---------    Aortic valve                             Value           Reference  Aortic valve peak velocity, S            429    cm/s      ---------  Aortic valve mean velocity, S            309    cm/s     ---------  Aortic valve VTI, S                      91.1   cm       ---------  Aortic mean gradient, S                  43     mm Hg    ---------  Aortic peak gradient, S                  74     mm Hg    ---------  VTI ratio, LVOT/AV                       0.31            ---------  Aortic valve area, VTI                   0.88   cm^2     ---------  Aortic valve area/bsa, VTI               0.5    cm^2/m^2 ---------  Velocity ratio, peak, LVOT/AV            0.28            ---------  Aortic valve area, peak velocity         0.8    cm^2     ---------  Aortic valve area/bsa, peak              0.45   cm^2/m^2 ---------  velocity  Velocity ratio, mean, LVOT/AV            0.28            ---------  Aortic valve area, mean velocity         0.8    cm^2     ---------  Aortic valve area/bsa, mean  0.45   cm^2/m^2 ---------  velocity  Aortic regurg pressure half-time         486    ms       ---------    Aorta                                    Value           Reference  Aortic root ID, ED                       29     mm       ---------  Ascending aorta ID, A-P, S               37     mm       ---------    Left atrium                              Value           Reference  LA ID, A-P, ES                           38     mm       ---------  LA ID/bsa, A-P                           2.14   cm/m^2   <=2.2  LA volume, S                             40.7   ml       ---------  LA volume/bsa, S                         22.9   ml/m^2   ---------  LA volume, ES, 1-p A4C                   38.8   ml       ---------  LA volume/bsa, ES, 1-p A4C               21.9   ml/m^2   ---------  LA volume, ES, 1-p A2C                   42.3   ml       ---------  LA volume/bsa, ES, 1-p A2C               23.8   ml/m^2   ---------    Mitral valve                             Value           Reference  Mitral E-wave peak velocity               118    cm/s     ---------  Mitral A-wave peak velocity              159    cm/s     ---------  Mitral deceleration time         (H)  359    ms       150 - 230  Mitral pressure half-time                105    ms       ---------  Mitral peak gradient, D                  6      mm Hg    ---------  Mitral E/A ratio, peak                   0.7             ---------  Mitral valve area, PHT, DP               2.1    cm^2     ---------  Mitral valve area/bsa, PHT, DP           1.18   cm^2/m^2 ---------    Pulmonary arteries                       Value           Reference  PA pressure, S, DP                       19     mm Hg    <=30    Tricuspid valve                          Value           Reference  Tricuspid regurg peak velocity           201    cm/s     ---------  Tricuspid peak RV-RA gradient            16     mm Hg    ---------    Systemic veins                           Value           Reference  Estimated CVP                            3      mm Hg    ---------    Right ventricle                          Value           Reference  TAPSE                                    19.5   mm       ---------  RV pressure, S, DP                       19     mm Hg    <=30  RV s&', lateral, S                        14.9   cm/s     ---------  Cardiac cath 02/03/18: 1. Normal coronary anatomy 2. Severe aortic stenosis.  Mean gradient 33 mm Hg, ABA 0.8 cm squared, index 0.49. 3. Normal right heart pressures and cardiac output 4. Normal LV filling pressures.  Fick Cardiac Output 4.65 L/min  Fick Cardiac Output Index 2.69 (L/min)/BSA  Aortic Mean Gradient 33.01 mmHg  Aortic Peak Gradient 36 mmHg  Aortic Valve Area 0.84  Aortic Value Area Index 0.49 cm2/BSA  RA A Wave 5 mmHg  RA V Wave 2 mmHg  RA Mean 1 mmHg  RV Systolic Pressure 31 mmHg  RV Diastolic Pressure 0 mmHg  RV EDP 4 mmHg  PA Systolic Pressure 25 mmHg  PA Diastolic Pressure 5 mmHg  PA Mean 13 mmHg  PW A Wave 9 mmHg  PW V Wave 5  mmHg  PW Mean 4 mmHg  AO Systolic Pressure 625 mmHg  AO Diastolic Pressure 74 mmHg  AO Mean 638 mmHg  LV Systolic Pressure 937 mmHg  LV Diastolic Pressure 4 mmHg  LV EDP 9 mmHg  AOp Systolic Pressure 342 mmHg  AOp Diastolic Pressure 71 mmHg  AOp Mean Pressure 876 mmHg  LVp Systolic Pressure 811 mmHg  LVp Diastolic Pressure 3 mmHg  LVp EDP Pressure 9 mmHg  QP/QS 1  TPVR Index 4.84 HRUI  TSVR Index 34.64 HRUI  PVR SVR Ratio 0.1  TPVR/TSVR Ratio 0.14   EKG:  EKG is not ordered today. The ekg ordered today demonstrates   Recent Labs: 01/29/2018: BUN 21; Creatinine, Ser 0.94; Hemoglobin 14.0; Platelets 184; Potassium 4.0; Sodium 139   Lipid Panel No results found for: CHOL, TRIG, HDL, CHOLHDL, VLDL, LDLCALC, LDLDIRECT   Wt Readings from Last 3 Encounters:  02/05/18 153 lb 3.2 oz (69.5 kg)  02/03/18 154 lb (69.9 kg)  01/29/18 154 lb (69.9 kg)     Other studies Reviewed: Additional studies/ records that were reviewed today include: . Review of the above records demonstrates:    Assessment and Plan:   1. Severe aortic stenosis: She has severe, stage D aortic valve stenosis. I have personally reviewed the echo images. The aortic valve is thickened, calcified with limited leaflet mobility. I think she would benefit from AVR. Given advanced age, she is not a good candidate for conventional AVR by surgical approach. I think she may be a good candidate for TAVR.   STS Risk Score:  Procedure: Isolated AVR  Risk of Mortality: 1.933%  Renal Failure: 1.117%  Permanent Stroke: 1.372%  Prolonged Ventilation: 5.195%  DSW Infection: 0.056%  Reoperation: 3.154%  Morbidity or Mortality: 8.875%  Short Length of Stay: 43.293%  Long Length of Stay: 3.659%   I have reviewed the natural history of aortic stenosis with the patient and their family members  who are present today. We have discussed the limitations of medical therapy and the poor prognosis associated with symptomatic aortic  stenosis. We have reviewed potential treatment options, including palliative medical therapy, conventional surgical aortic valve replacement, and transcatheter aortic valve replacement. We discussed treatment options in the context of the patient's specific comorbid medical conditions.   She would like to proceed with planning for TAVR. Risks and benefits of the TAVR procedure reviewed with the patient. We will arrange a cardiac CT, CTA of the chest/abdomen and pelvis, carotid dopplers, PT assessment  PFTs and will then be referred to see one of the CT surgeons on our TAVR team.    Current medicines are reviewed at length with the patient today.  The patient does not have concerns regarding medicines.  The following changes have been made:  no  change  Labs/ tests ordered today include:  No orders of the defined types were placed in this encounter.    Disposition:   FU with the valve team   Signed, Lauree Chandler, MD 02/05/2018 1:30 PM    [address], [place], [place]  [address] Phone: [phone]; Fax: [phone]

## 2018-02-17 ENCOUNTER — Ambulatory Visit (HOSPITAL_COMMUNITY)
Admission: RE | Admit: 2018-02-17 | Discharge: 2018-02-17 | Disposition: A | Discharge status: Home / Self Care | Payer: Medicare Other | Source: Ambulatory Visit | Attending: Cardiovascular Disease | Admitting: Cardiovascular Disease

## 2018-02-17 ENCOUNTER — Encounter (HOSPITAL_COMMUNITY)
Admission: RE | Admit: 2018-02-17 | Discharge: 2018-02-17 | Disposition: A | Discharge status: Home / Self Care | Payer: Medicare Other | Source: Ambulatory Visit | Attending: Cardiovascular Disease | Admitting: Cardiovascular Disease

## 2018-02-17 ENCOUNTER — Ambulatory Visit (HOSPITAL_BASED_OUTPATIENT_CLINIC_OR_DEPARTMENT_OTHER)
Admission: RE | Admit: 2018-02-17 | Discharge: 2018-02-17 | Disposition: A | Discharge status: Home / Self Care | Payer: Medicare Other | Source: Ambulatory Visit | Attending: Cardiovascular Disease | Admitting: Cardiovascular Disease

## 2018-02-17 DIAGNOSIS — R0789 Other chest pain: Secondary | ICD-10-CM

## 2018-02-17 DIAGNOSIS — I700 Atherosclerosis of aorta: Secondary | ICD-10-CM | POA: Insufficient documentation

## 2018-02-17 DIAGNOSIS — Z01810 Encounter for preprocedural cardiovascular examination: Secondary | ICD-10-CM | POA: Diagnosis not present

## 2018-02-17 DIAGNOSIS — R0602 Shortness of breath: Secondary | ICD-10-CM

## 2018-02-17 DIAGNOSIS — I350 Nonrheumatic aortic (valve) stenosis: Secondary | ICD-10-CM

## 2018-02-17 DIAGNOSIS — I6523 Occlusion and stenosis of bilateral carotid arteries: Secondary | ICD-10-CM | POA: Diagnosis not present

## 2018-02-17 DIAGNOSIS — I2510 Atherosclerotic heart disease of native coronary artery without angina pectoris: Secondary | ICD-10-CM | POA: Diagnosis not present

## 2018-02-17 DIAGNOSIS — R911 Solitary pulmonary nodule: Secondary | ICD-10-CM | POA: Insufficient documentation

## 2018-02-17 DIAGNOSIS — Q2549 Other congenital malformations of aorta: Secondary | ICD-10-CM | POA: Insufficient documentation

## 2018-02-17 LAB — PULMONARY FUNCTION TEST
DL/VA % pred: 93 %
DL/VA: 4.26 ml/min/mmHg/L
DLCO UNC: 17.36 ml/min/mmHg
DLCO unc % pred: 80 %
FEF 25-75 Pre: 1.95 L/sec
FEF2575-%PRED-PRE: 134 %
FEV1-%PRED-PRE: 114 %
FEV1-Pre: 2.12 L
FEV1FVC-%PRED-PRE: 104 %
FEV6-%Pred-Pre: 115 %
FEV6-Pre: 2.72 L
FEV6FVC-%Pred-Pre: 105 %
FVC-%Pred-Pre: 109 %
FVC-Pre: 2.73 L
PRE FEV1/FVC RATIO: 78 %
PRE FEV6/FVC RATIO: 100 %
RV % pred: 81 %
RV: 1.82 L
TLC % PRED: 92 %
TLC: 4.38 L

## 2018-02-17 MED ORDER — IOPAMIDOL (ISOVUE-370) INJECTION 76%
100.0000 mL | Freq: Once | INTRAVENOUS | Status: AC | PRN
  Administered 2018-02-17: 100 mL via INTRAVENOUS

## 2018-02-17 NOTE — Progress Notes (Signed)
Preliminary notes--Bilateral carotid duplex exam completed. Bilateral ICAs 1-39% stenosis according to the category of velocities. Bilateral vertebral arteries are patent with antegrade flow.  Shannon Mills (RDMS RVT) 02/17/18 1:11 PM

## 2018-02-23 ENCOUNTER — Encounter: Payer: Self-pay | Admitting: Thoracic Surgery (Cardiothoracic Vascular Surgery)

## 2018-02-23 ENCOUNTER — Encounter: Payer: Self-pay | Admitting: Physical Therapy

## 2018-02-23 ENCOUNTER — Ambulatory Visit: Payer: Medicare Other | Attending: Cardiovascular Disease | Admitting: Physical Therapy

## 2018-02-23 ENCOUNTER — Other Ambulatory Visit: Payer: Self-pay

## 2018-02-23 ENCOUNTER — Institutional Professional Consult (permissible substitution) (INDEPENDENT_AMBULATORY_CARE_PROVIDER_SITE_OTHER): Payer: Medicare Other | Admitting: Thoracic Surgery (Cardiothoracic Vascular Surgery)

## 2018-02-23 VITALS — BP 123/78 | HR 84 | Resp 16 | Ht 62.25 in | Wt 154.0 lb

## 2018-02-23 DIAGNOSIS — I350 Nonrheumatic aortic (valve) stenosis: Secondary | ICD-10-CM

## 2018-02-23 DIAGNOSIS — R2689 Other abnormalities of gait and mobility: Secondary | ICD-10-CM | POA: Diagnosis not present

## 2018-02-23 DIAGNOSIS — I209 Angina pectoris, unspecified: Secondary | ICD-10-CM

## 2018-02-23 NOTE — Progress Notes (Signed)
[facility]  MULTIDISCIPLINARY HEART VALVE CLINIC  CARDIOTHORACIC SURGERY CONSULTATION REPORT  Referring Provider is Revankar, Reita Cliche, MD PCP is [name]., MD  Chief Complaint  Patient presents with  . Aortic Stenosis    TAVR EVAL...all studies are completed...exercise study just prior to arrival    HPI:  Patient is a 77 year old female with history of aortic stenosis, hypertension, hyperlipidemia, and hypothyroidism who has been referred for surgical consultation to discuss treatment options for management of severe symptomatic aortic stenosis.  Patient states that she was first noted to have a heart murmur on physical exam by her primary care physician several years ago.  Echocardiogram performed August 2018 reportedly revealed normal left ventricular systolic function with moderate aortic stenosis..  Over the past 4 to 6 months the patient has developed progressive fatigue with decreased energy, decreased exercise tolerance, and substernal chest tightness brought on with physical exertion and relieved by rest.  She has recently developed some exertional shortness of breath.  She was referred to Dr. Geraldo Pitter and transthoracic echocardiogram performed February 03, 2018 revealed severe aortic stenosis with preserved left ventricular systolic function.  Diagnostic cardiac catheterization confirmed the presence of severe aortic stenosis and was notable for the absence of significant coronary artery disease.  Patient was referred to the multidisciplinary heart valve clinic and has been evaluated previously by Dr. Angelena Form.  CT angiography has been performed and the patient was referred for surgical consultation.  Patient is married and lives with her husband in the country [place].  She has been retired for more than 20 years but has remained physically active and entirely functionally independent.  She reports no significant physical limitations whatsoever until the  past 4 to 6 months.  Her biggest complaint of decreased energy and worsening exercise tolerance.  She has developed tightness across her chest that is typically brought on with physical exertion such as working in the yard or working in [employer].  Symptoms are always relieved by rest.  Recently she has began to experience some exertional shortness of breath.  She denies any resting shortness of breath, PND, orthopnea, or lower extremity edema.  She has not had palpitations, dizzy spells, nor syncope.  Her mobility is otherwise normal and she reports no physical limitations other than that related to progressive fatigue and chest tightness.  Past Medical History:  Diagnosis Date  . Anxiety   . Aortic stenosis   . Bladder problem   . Chronic reflux esophagitis   . Hypercholesterolemia   . Hypertension   . Hypothyroidism   . Osteoporosis   . Systolic murmur   . Vitamin D deficiency     Past Surgical History:  Procedure Laterality Date  . CHOLECYSTECTOMY    . FOOT SURGERY    . RIGHT/LEFT HEART CATH AND CORONARY ANGIOGRAPHY N/A 02/03/2018   Procedure: RIGHT/LEFT HEART CATH AND CORONARY ANGIOGRAPHY;  Surgeon: Martinique, Peter M, MD;  Location: [facility];  Service: Cardiovascular;  Laterality: N/A;  . TUBAL LIGATION      Family History  Problem Relation Age of Onset  . Dementia Mother   . COPD Father   . Throat cancer Brother     Social History   Socioeconomic History  . Marital status: Married    Spouse name: Not on file  . Number of children: 2  . Years of education: Not on file  . Highest education level: Not on file  Occupational History  . Occupation: [employer]  Social Needs  . Financial resource strain: Not on file  . Food insecurity:    Worry: Not on file    Inability: Not on file  . Transportation needs:    Medical: Not on file    Non-medical: Not on file  Tobacco Use  . Smoking status: Never Smoker  . Smokeless tobacco: Never Used    Substance and Sexual Activity  . Alcohol use: No  . Drug use: No  . Sexual activity: Not on file  Lifestyle  . Physical activity:    Days per week: Not on file    Minutes per session: Not on file  . Stress: Not on file  Relationships  . Social connections:    Talks on phone: Not on file    Gets together: Not on file    Attends religious service: Not on file    Active member of club or organization: Not on file    Attends meetings of clubs or organizations: Not on file    Relationship status: Not on file  . Intimate partner violence:    Fear of current or ex partner: Not on file    Emotionally abused: Not on file    Physically abused: Not on file    Forced sexual activity: Not on file  Other Topics Concern  . Not on file  Social History Narrative  . Not on file    Current Outpatient Medications  Medication Sig Dispense Refill  . aspirin EC 81 MG tablet Take 81 mg by mouth daily.    . Aspirin-Acetaminophen-Caffeine (GOODY HEADACHE PO) Take 1 packet by mouth daily as needed (headaches).    . cetirizine (ZYRTEC) 10 MG tablet Take 10 mg by mouth at bedtime.    . cholecalciferol (VITAMIN D) 1000 units tablet Take 1,000 Units by mouth daily.    Marland Kitchen escitalopram (LEXAPRO) 10 MG tablet Take 10 mg by mouth at bedtime.     Marland Kitchen levothyroxine (SYNTHROID, LEVOTHROID) 50 MCG tablet Take 50 mcg by mouth daily before breakfast.    . metoprolol succinate (TOPROL-XL) 25 MG 24 hr tablet Take 25 mg by mouth at bedtime.   3  . omeprazole (PRILOSEC) 40 MG capsule Take 40 mg by mouth daily. Take around 1300    . rosuvastatin (CRESTOR) 40 MG tablet Take 40 mg by mouth at bedtime.      No current facility-administered medications for this visit.     Allergies  Allergen Reactions  . Levaquin [Levofloxacin]     Makes her sick      Review of Systems:   General:  Normal appetite, decreased energy, + weight gain, no weight loss, no fever  Cardiac:  + chest pain with exertion, no chest pain at  rest, + SOB with exertion, no resting SOB, no PND, no orthopnea, no palpitations, no arrhythmia, no atrial fibrillation, no LE edema, no dizzy spells, no syncope  Respiratory:  no shortness of breath, no home oxygen, no productive cough, + dry cough, + bronchitis, no wheezing, no hemoptysis, no asthma, no pain with inspiration or cough, no sleep apnea, no CPAP at night  GI:   no difficulty swallowing, + reflux, no frequent heartburn, no hiatal hernia, no abdominal pain, no constipation, no diarrhea, no hematochezia, no hematemesis, no melena  GU:   no dysuria,  no frequency, no urinary tract infection, no hematuria, no kidney stones, no kidney disease  Vascular:  no pain suggestive of claudication, no pain in feet, no leg cramps, no varicose veins,  no DVT, no non-healing foot ulcer  Neuro:   no stroke, no TIA's, no seizures, + headaches, no temporary blindness one eye,  no slurred speech, no peripheral neuropathy, no chronic pain, no instability of gait, no memory/cognitive dysfunction  Musculoskeletal: no arthritis, no joint swelling, no myalgias, no difficulty walking, normal mobility   Skin:   no rash, no itching, no skin infections, no pressure sores or ulcerations  Psych:   + anxiety, no depression, no nervousness, + unusual recent stress  Eyes:   no blurry vision, no floaters, no recent vision changes, + wears glasses or contacts  ENT:   no hearing loss, no loose or painful teeth, no dentures, last saw dentist 02/19/2018  Hematologic:  + easy bruising, no abnormal bleeding, no clotting disorder, no frequent epistaxis  Endocrine:  no diabetes, does not check CBG's at home           Physical Exam:   BP 123/78 (BP Location: Right Arm, Patient Position: Sitting, Cuff Size: Large)   Pulse 84   Resp 16   Ht 5' 2.25" (1.581 m)   Wt 154 lb (69.9 kg)   SpO2 96% Comment: ON RA  BMI 27.94 kg/m   General:    well-appearing  HEENT:  Unremarkable   Neck:   no JVD, no bruits, no adenopathy    Chest:   clear to auscultation, symmetrical breath sounds, no wheezes, no rhonchi   CV:   RRR, grade III/VI crescendo/decrescendo murmur heard best at RUSB,  no diastolic murmur  Abdomen:  soft, non-tender, no masses   Extremities:  warm, well-perfused, pulses diminished, no LE edema  Rectal/GU  Deferred  Neuro:   Grossly non-focal and symmetrical throughout  Skin:   Clean and dry, no rashes, no breakdown   Diagnostic Tests:  Transthoracic Echocardiography  Patient:    Laquitta, Dominski MR #:       [id number] Study Date: 02/03/2018 Gender:     F Age:        31 Height:     158.8 cm Weight:     69.9 kg BSA:        1.78 m^2 Pt. Status: Room:   ATTENDING    Peter Martinique, M.D.  ORDERING     Peter Martinique, M.D.  PERFORMING   [facility], Inpatient  SONOGRAPHER  Darlina Sicilian, RDCS  cc:  ------------------------------------------------------------------- LV EF: 60% -   65%  ------------------------------------------------------------------- Indications:      Aortic stenosis 424.1.  ------------------------------------------------------------------- History:   PMH:   Chest pain.  Risk factors:  Hypertension. Dyslipidemia.  ------------------------------------------------------------------- Study Conclusions  - Left ventricle: The cavity size was normal. Wall thickness was   normal. Systolic function was normal. The estimated ejection   fraction was in the range of 60% to 65%. Wall motion was normal;   there were no regional wall motion abnormalities. Doppler   parameters are consistent with abnormal left ventricular   relaxation (grade 1 diastolic dysfunction). Doppler parameters   are consistent with elevated mean left atrial filling pressure. - Aortic valve: Right coronary and left coronary cusp mobility was   severely restricted. There was severe stenosis. There was trivial   regurgitation. Valve area (VTI): 0.88 cm^2. Valve area (Vmax):   0.8 cm^2. Valve area  (Vmean): 0.8 cm^2. - Mitral valve: Calcified annulus. There was mild regurgitation.   Valve area by pressure half-time: 2.1 cm^2. - Left atrium: The atrium was mildly dilated.  ------------------------------------------------------------------- Study data:  No prior study was available for  comparison.  Study status:  STAT.  Procedure:  The patient reported no pain pre or post test. Transthoracic echocardiography. Image quality was adequate.  Study completion:  There were no complications. Transthoracic echocardiography.  M-mode, complete 2D, spectral Doppler, and color Doppler.  Birthdate:  Patient birthdate: Mar 19, 1941.  Age:  Patient is 77 yr old.  Sex:  Gender: female. BMI: 27.7 kg/m^2.  Blood pressure:     156/78  Patient status: Inpatient.  Study date:  Study date: 02/03/2018. Study time: 07:31 AM.  Location:  Catheterization laboratory.  -------------------------------------------------------------------  ------------------------------------------------------------------- Left ventricle:  The cavity size was normal. Wall thickness was normal. Systolic function was normal. The estimated ejection fraction was in the range of 60% to 65%. Wall motion was normal; there were no regional wall motion abnormalities. Doppler parameters are consistent with abnormal left ventricular relaxation (grade 1 diastolic dysfunction). Doppler parameters are consistent with elevated mean left atrial filling pressure.  ------------------------------------------------------------------- Aortic valve:   Trileaflet; severely thickened, moderately calcified leaflets.  Right coronary and left coronary cusp mobility was severely restricted.  Doppler:   There was severe stenosis. There was trivial regurgitation.    VTI ratio of LVOT to aortic valve: 0.31. Valve area (VTI): 0.88 cm^2. Indexed valve area (VTI): 0.5 cm^2/m^2. Peak velocity ratio of LVOT to aortic valve: 0.28. Valve area (Vmax): 0.8 cm^2.  Indexed valve area (Vmax): 0.45 cm^2/m^2. Mean velocity ratio of LVOT to aortic valve: 0.28. Valve area (Vmean): 0.8 cm^2. Indexed valve area (Vmean): 0.45 cm^2/m^2.   Mean gradient (S): 43 mm Hg. Peak gradient (S): 74 mm Hg.  ------------------------------------------------------------------- Aorta:  Aortic root: The aortic root was normal in size. Ascending aorta: The ascending aorta was normal in size.  ------------------------------------------------------------------- Mitral valve:   Calcified annulus. Leaflet separation was normal. Doppler:  Transvalvular velocity was within the normal range. There was no evidence for stenosis. There was mild regurgitation. Valve area by pressure half-time: 2.1 cm^2. Indexed valve area by pressure half-time: 1.18 cm^2/m^2.    Peak gradient (D): 6 mm Hg.   ------------------------------------------------------------------- Left atrium:  The atrium was mildly dilated.  ------------------------------------------------------------------- Right ventricle:  The cavity size was normal. Systolic function was normal.  ------------------------------------------------------------------- Pulmonic valve:    Structurally normal valve.   Cusp separation was normal.  Doppler:  Transvalvular velocity was within the normal range. There was no regurgitation.  ------------------------------------------------------------------- Tricuspid valve:   Structurally normal valve.   Leaflet separation was normal.  Doppler:  Transvalvular velocity was within the normal range. There was trivial regurgitation.  ------------------------------------------------------------------- Right atrium:  The atrium was normal in size.  ------------------------------------------------------------------- Pericardium:  There was no pericardial effusion.  ------------------------------------------------------------------- Measurements   Left ventricle                            Value           Reference  LV ID, ED, PLAX chordal          (L)     36     mm       43 - 52  LV ID, ES, PLAX chordal                  24     mm       23 - 38  LV fx shortening, PLAX chordal           33     %        >=  29  LV PW thickness, ED                      7      mm       ---------  IVS/LV PW ratio, ED              (H)     1.57            <=1.3  Stroke volume, 2D                        90     ml       ---------  Stroke volume/bsa, 2D                    51     ml/m^2   ---------  LV e&', lateral                           5.87   cm/s     ---------  LV E/e&', lateral                         20.1            ---------  LV e&', medial                            3.59   cm/s     ---------  LV E/e&', medial                          32.87           ---------  LV e&', average                           4.73   cm/s     ---------  LV E/e&', average                         24.95           ---------    Ventricular septum                       Value           Reference  IVS thickness, ED                        11.01  mm       ---------    LVOT                                     Value           Reference  LVOT ID, S                               19     mm       ---------  LVOT area                                2.84  cm^2     ---------  LVOT peak velocity, S                    121.06 cm/s     ---------  LVOT mean velocity, S                    86.6   cm/s     ---------  LVOT VTI, S                              27.95  cm       ---------  LVOT peak gradient, S                    6      mm Hg    ---------  Stroke volume (SV), LVOT DP              79.3   ml       ---------  Stroke index (SV/bsa), LVOT DP           44.6   ml/m^2   ---------    Aortic valve                             Value           Reference  Aortic valve peak velocity, S            429    cm/s     ---------  Aortic valve mean velocity, S            309    cm/s     ---------  Aortic valve VTI, S                      91.1   cm        ---------  Aortic mean gradient, S                  43     mm Hg    ---------  Aortic peak gradient, S                  74     mm Hg    ---------  VTI ratio, LVOT/AV                       0.31            ---------  Aortic valve area, VTI                   0.88   cm^2     ---------  Aortic valve area/bsa, VTI               0.5    cm^2/m^2 ---------  Velocity ratio, peak, LVOT/AV            0.28            ---------  Aortic valve area, peak velocity         0.8    cm^2     ---------  Aortic valve area/bsa, peak              0.45   cm^2/m^2 ---------  velocity  Velocity ratio, mean, LVOT/AV            0.28            ---------  Aortic valve area, mean velocity         0.8    cm^2     ---------  Aortic valve area/bsa, mean              0.45   cm^2/m^2 ---------  velocity  Aortic regurg pressure half-time         486    ms       ---------    Aorta                                    Value           Reference  Aortic root ID, ED                       29     mm       ---------  Ascending aorta ID, A-P, S               37     mm       ---------    Left atrium                              Value           Reference  LA ID, A-P, ES                           38     mm       ---------  LA ID/bsa, A-P                           2.14   cm/m^2   <=2.2  LA volume, S                             40.7   ml       ---------  LA volume/bsa, S                         22.9   ml/m^2   ---------  LA volume, ES, 1-p A4C                   38.8   ml       ---------  LA volume/bsa, ES, 1-p A4C               21.9   ml/m^2   ---------  LA volume, ES, 1-p A2C                   42.3   ml       ---------  LA volume/bsa, ES, 1-p A2C               23.8   ml/m^2   ---------    Mitral valve                             Value           Reference  Mitral E-wave peak velocity              118    cm/s     ---------  Mitral A-wave peak velocity              159    cm/s     ---------  Mitral deceleration time         (H)     359     ms       150 - 230  Mitral pressure half-time                105    ms       ---------  Mitral peak gradient, D                  6      mm Hg    ---------  Mitral E/A ratio, peak                   0.7             ---------  Mitral valve area, PHT, DP               2.1    cm^2     ---------  Mitral valve area/bsa, PHT, DP           1.18   cm^2/m^2 ---------    Pulmonary arteries                       Value           Reference  PA pressure, S, DP                       19     mm Hg    <=30    Tricuspid valve                          Value           Reference  Tricuspid regurg peak velocity           201    cm/s     ---------  Tricuspid peak RV-RA gradient            16     mm Hg    ---------    Systemic veins                           Value           Reference  Estimated CVP                            3      mm Hg    ---------    Right ventricle                          Value           Reference  TAPSE                                    19.5   mm       ---------  RV pressure, S, DP                       19     mm Hg    <=30  RV s&', lateral,  S                        14.9   cm/s     ---------  Legend: (L)  and  (H)  mark values outside specified reference range.  ------------------------------------------------------------------- Prepared and Electronically Authenticated by  Sanda Klein, MD 2019-10-15T12:12:08   RIGHT/LEFT HEART CATH AND CORONARY ANGIOGRAPHY  Conclusion     LV end diastolic pressure is normal.  There is severe aortic valve stenosis.   1. Normal coronary anatomy 2. Severe aortic stenosis. Mean gradient 33 mm Hg, ABA 0.8 cm squared, index 0.49. 3. Normal right heart pressures and cardiac output 4. Normal LV filling pressures.  Plan: refer to heart valve team for AVR.    Indications   Nonrheumatic aortic valve stenosis [I35.0 (ICD-10-CM)]  Procedural Details/Technique   Technical Details Indication: 77 yo WF presents with progressive angina.  She has severe aortic stenosis. By Echo done today peak gradient is 4.3 m/sec with mean gradient of 43 mm Hg, peak gradient 74 mm Hg, and AVA of 0.8 cm squared. LV function was normal.  Procedural Details: The right wrist was prepped, draped, and anesthetized with 1% lidocaine. Using the modified Seldinger technique a 6 Fr slender sheath was placed in the right radial artery. A 5 French sheath was placed in the left brachial vein. A Swan-Ganz catheter was used for the right heart catheterization. Standard protocol was followed for recording of right heart pressures and sampling of oxygen saturations. Fick cardiac output was calculated. Standard Judkins catheters were used for selective coronary angiography and left ventricular pressures. There were no immediate procedural complications. The patient was transferred to the post catheterization recovery area for further monitoring.  Contrast: 40 cc   Estimated blood loss <50 mL.  During this procedure the patient was administered the following to achieve and maintain moderate conscious sedation: Versed 1 mg, Fentanyl 25 mcg, while the patient's heart rate, blood pressure, and oxygen saturation were continuously monitored. The period of conscious sedation was 35 minutes, of which I was present face-to-face 100% of this time.  Complications   Complications documented before study signed (02/03/2018 10:34 AM EDT)    No complications were associated with this study.  Documented by Martinique, Peter M, MD - 02/03/2018 10:32 AM EDT    Coronary Findings   Diagnostic  Dominance: Right  Left Main  Vessel was injected. Vessel is normal in caliber. Vessel is angiographically normal.  Left Anterior Descending  Vessel was injected. Vessel is normal in caliber. Vessel is angiographically normal.  Left Circumflex  Vessel was injected. Vessel is normal in caliber. Vessel is angiographically normal.  Right Coronary Artery  Vessel was injected. Vessel is normal  in caliber. Vessel is angiographically normal.  Intervention   No interventions have been documented.  Left Heart   Left Ventricle LV end diastolic pressure is normal.  Mitral Valve The annulus is calcified.  Aortic Valve There is severe aortic valve stenosis. The aortic valve is calcified. There is restricted aortic valve motion.  Coronary Diagrams   Diagnostic Diagram       Implants    No implant documentation for this case.  MERGE Images   Show images for CARDIAC CATHETERIZATION   Link to Procedure Log   Procedure Log    Hemo Data    Most Recent Value  Fick Cardiac Output 4.65 L/min  Fick Cardiac Output Index 2.69 (L/min)/BSA  Aortic Mean Gradient 33.01 mmHg  Aortic Peak  Gradient 36 mmHg  Aortic Valve Area 0.84  Aortic Value Area Index 0.49 cm2/BSA  RA A Wave 5 mmHg  RA V Wave 2 mmHg  RA Mean 1 mmHg  RV Systolic Pressure 31 mmHg  RV Diastolic Pressure 0 mmHg  RV EDP 4 mmHg  PA Systolic Pressure 25 mmHg  PA Diastolic Pressure 5 mmHg  PA Mean 13 mmHg  PW A Wave 9 mmHg  PW V Wave 5 mmHg  PW Mean 4 mmHg  AO Systolic Pressure 637 mmHg  AO Diastolic Pressure 74 mmHg  AO Mean 858 mmHg  LV Systolic Pressure 850 mmHg  LV Diastolic Pressure 4 mmHg  LV EDP 9 mmHg  AOp Systolic Pressure 277 mmHg  AOp Diastolic Pressure 71 mmHg  AOp Mean Pressure 412 mmHg  LVp Systolic Pressure 878 mmHg  LVp Diastolic Pressure 3 mmHg  LVp EDP Pressure 9 mmHg  QP/QS 1  TPVR Index 4.84 HRUI  TSVR Index 34.64 HRUI  PVR SVR Ratio 0.1  TPVR/TSVR Ratio 0.14  Order-Level Documents:   There are no order-level documents.  Encounter-Level Documents - 02/03/2018:   Scan on 02/04/2018 3:24 PM by Default, Provider, MDScan on 02/04/2018 3:24 PM by Default, Provider, MD  Scan on 02/04/2018 3:00 PM by Default, Provider, MDScan on 02/04/2018 3:00 PM by Default, Provider, MD  Document on 02/03/2018 10:46 AM by Jonathon Jordan, RN: After Visit Summary  Document on 02/03/2018 10:46 AM by  Jonathon Jordan, RN: IP After Visit Summary  Scan on 02/03/2018 10:29 AM by Default, Provider, MDScan on 02/03/2018 10:29 AM by Default, Provider, MD  Electronic signature on 02/03/2018 5:27 AM - Signed  Electronic signature on 02/03/2018 5:26 AM - Signed      Signed   Electronically signed by Martinique, Peter M, MD on 02/03/18 at 1034 EDT     Cardiac TAVR CT  TECHNIQUE: The patient was scanned on a Siemens Force 676 slice scanner. A 120 kV retrospective scan was triggered in the ascending thoracic aorta at 140 HU's. Gantry rotation speed was 250 msecs and collimation was .6 mm. No beta blockade or nitro were given. The 3D data set was reconstructed in 5% intervals of the R-R cycle. Systolic and diastolic phases were analyzed on a dedicated work station using MPR, MIP and VRT modes. The patient received 80 cc of contrast.  FINDINGS: Aortic Valve: Tri leaflet some calcification thickened leaflet tips  Aorta: Bovine arch with mild calcific aortic atherosclerosis  Sino-tubular Junction: 25 mm  Ascending Thoracic Aorta: 35 mm  Aortic Arch: 27 mm  Descending Thoracic Aorta: 23 mm  Sinus of Valsalva Measurements:  Non-coronary: 28.4 mm  Right - coronary: 26.2 mm  Left -   coronary: 27.9 mm  Coronary Artery Height above Annulus:  Left Main: 11.25 mm above annulus  Right Coronary: 17.2 mm above annulus  Virtual Basal Annulus Measurements:  Maximum / Minimum Diameter: 23.5 mm x 17.7 mm  Perimeter: 67 mm  Area: 311-340 mm2  Coronary Arteries: Sufficient height above annulus for deployment  Optimum Fluoroscopic Angle for Delivery: LAO 25 degrees Cranial 4 degrees  IMPRESSION: 1. Trileaflet AV with annular area at most 340 mm2 lower limit suitable for a 23 mm Sapien 3 valve or a 26 mm Evolut Pro valve  2.  Normal aortic root diameter 3.5 cm with bovine arch  3.  Coronary arteries sufficient height above annulus for deployment  4. Optimum  angiographic angle for deployment LAO 25 degrees Cranial 4 degrees  5.  No LAA thrombus  Jenkins Rouge  Electronically Signed: By: Jenkins Rouge M.D. On: 02/17/2018 16:50  CT ANGIOGRAPHY CHEST, ABDOMEN AND PELVIS  TECHNIQUE: Multidetector CT imaging through the chest, abdomen and pelvis was performed using the standard protocol during bolus administration of intravenous contrast. Multiplanar reconstructed images and MIPs were obtained and reviewed to evaluate the vascular anatomy.  CONTRAST:  110mL ISOVUE-370 IOPAMIDOL (ISOVUE-370) INJECTION 76%  COMPARISON:  None.  FINDINGS: CTA CHEST FINDINGS  Cardiovascular: Normal heart size. No significant pericardial effusion/thickening. Severe thickening and calcification of the aortic valve. Right coronary atherosclerosis. Atherosclerotic nonaneurysmal thoracic aorta. Normal caliber pulmonary arteries. No central pulmonary emboli.  Mediastinum/Nodes: Hypodense subcentimeter bilateral thyroid lobe nodules. Unremarkable esophagus. No pathologically enlarged axillary, mediastinal or hilar lymph nodes.  Lungs/Pleura: No pneumothorax. No pleural effusion. Peripheral left lower lobe 3 mm solid pulmonary nodule (series 5/image 65). No acute consolidative airspace disease, lung masses or additional significant pulmonary nodules.  Musculoskeletal: No aggressive appearing focal osseous lesions. T8 vertebral hemangioma. Mild thoracic spondylosis.  CTA ABDOMEN AND PELVIS FINDINGS  Hepatobiliary: Normal liver with no liver mass. Cholecystectomy. Bile ducts are within normal post cholecystectomy limits.  Pancreas: Normal, with no mass or duct dilation.  Spleen: Normal size. No mass.  Adrenals/Urinary Tract: Normal adrenals. Normal kidneys with no hydronephrosis and no renal mass. Normal bladder.  Stomach/Bowel: Normal non-distended stomach. Normal caliber small bowel with no small bowel wall thickening. Normal  appendix. Normal large bowel with no diverticulosis, large bowel wall thickening or pericolonic fat stranding.  Vascular/Lymphatic: Atherosclerotic nonaneurysmal abdominal aorta. Patent portal, splenic and renal veins. No pathologically enlarged lymph nodes in the abdomen or pelvis.  Reproductive: Grossly normal uterus.  No adnexal mass.  Other: No pneumoperitoneum, ascites or focal fluid collection.  Musculoskeletal: No aggressive appearing focal osseous lesions. Moderate thoracic spondylosis. Small sclerotic lesion in the left superior acetabulum is nonspecific and most likely a benign bone island.  VASCULAR MEASUREMENTS PERTINENT TO TAVR:  AORTA:  Minimal Aortic Diameter-12.9 x 11.7 mm (infrarenal abdominal aorta on series 4/image 121)  Severity of Aortic Calcification-moderate  RIGHT PELVIS:  Right Common Iliac Artery -  Minimal Diameter-7.9 x 7.0 mm  Tortuosity-mild  Calcification-moderate  Right External Iliac Artery -  Minimal Diameter-5.4 x 5.1 mm  Tortuosity-moderate  Calcification-none  Right Common Femoral Artery -  Minimal Diameter-5.7 x 5.1 mm  Tortuosity-mild  Calcification-mild  LEFT PELVIS:  Left Common Iliac Artery -  Minimal Diameter-6.6 x 5.5 mm  Tortuosity-mild  Calcification-moderate  Left External Iliac Artery -  Minimal Diameter-5.6 x 5.6 mm  Tortuosity-moderate  Calcification-mild  Left Common Femoral Artery -  Minimal Diameter-6.2 x 5.7 mm  Tortuosity-mild  Calcification-mild  Review of the MIP images confirms the above findings.  IMPRESSION: 1. Vascular findings and measurements pertinent to potential TAVR procedure, as detailed. 2. Severe thickening and calcification of the aortic valve, compatible with the reported clinical history of severe aortic stenosis. 3. Aortic Atherosclerosis (ICD10-I70.0). 4. One vessel coronary atherosclerosis. 5. Solitary 3 mm solid  pulmonary nodule. No follow-up needed if patient is low-risk. Non-contrast chest CT can be considered in 12 months if patient is high-risk. This recommendation follows the consensus statement: Guidelines for Management of Incidental Pulmonary Nodules Detected on CT Images:From the Fleischner Society 2017; published online before print (10.1148/[id number]).   Electronically Signed   By: Ilona Sorrel M.D.   On: 02/18/2018 09:18  Impression:  Patient has stage D severe symptomatic aortic stenosis.  She describes gradual progression of symptoms of worsening  fatigue, decreased energy, exertional chest tightness, and exertional shortness of breath consistent with stable angina pectoris and chronic diastolic congestive heart failure, New [place] Heart Association functional class II.  I have personally reviewed the patient's recent transthoracic echocardiogram, diagnostic cardiac catheterization, and CT angiograms.  Echocardiogram confirmed the presence of normal left ventricular systolic function with severe aortic stenosis.  The aortic valve is trileaflet.  There is severe thickening, fibrosis, calcification, and restricted leaflet mobility involving all 3 leaflets of the patient's aortic valve.  Peak velocity across aortic valve measured 4.3 m/s corresponding to mean transvalvular gradient estimated 43 mmHg.  Diagnostic cardiac catheterization confirmed the presence of severe aortic stenosis and revealed normal coronary artery anatomy with no significant coronary artery disease.  Right heart pressures were normal.  I agree the patient would benefit from elective aortic valve replacement.  Cardiac-gated CTA of the heart reveals anatomical characteristics consistent with aortic stenosis suitable for treatment by transcatheter aortic valve replacement without any significant complicating features and CTA of the aorta and iliac vessels demonstrate what appears to be adequate pelvic vascular access to  facilitate a transfemoral approach.    Plan:  The patient and her family were counseled at length regarding treatment alternatives for management of severe symptomatic aortic stenosis. Alternative approaches such as conventional aortic valve replacement, transcatheter aortic valve replacement, and continued medical therapy without intervention were compared and contrasted at length.  The risks associated with conventional surgical aortic valve replacement were discussed in detail, as were expectations for post-operative convalescence.  Issues specific to transcatheter aortic valve replacement were discussed including questions about long term valve durability, the potential for paravalvular leak, possible increased risk of need for permanent pacemaker placement, and other technical complications related to the procedure itself.  Long-term prognosis with medical therapy was discussed. This discussion was placed in the context of the patient's own specific clinical presentation and past medical history.  All of their questions have been addressed.  The patient desires to proceed with transcatheter aortic valve replacement as soon as practical.  We tentatively plan to proceed with surgery on Tuesday, March 03, 2018.  Following the decision to proceed with transcatheter aortic valve replacement, a discussion has been held regarding what types of management strategies would be attempted intraoperatively in the event of life-threatening complications, including whether or not the patient would be considered a candidate for the use of cardiopulmonary bypass and/or conversion to open sternotomy for attempted surgical intervention.  The patient has been advised of a variety of complications that might develop including but not limited to risks of death, stroke, paravalvular leak, aortic dissection or other major vascular complications, aortic annulus rupture, device embolization, cardiac rupture or perforation,  mitral regurgitation, acute myocardial infarction, arrhythmia, heart block or bradycardia requiring permanent pacemaker placement, congestive heart failure, respiratory failure, renal failure, pneumonia, infection, other late complications related to structural valve deterioration or migration, or other complications that might ultimately cause a temporary or permanent loss of functional independence or other long term morbidity.  The patient provides full informed consent for the procedure as described and all questions were answered.   I spent in excess of 90 minutes during the conduct of this office consultation and >50% of this time involved direct face-to-face encounter with the patient for counseling and/or coordination of their care.    Valentina Gu. Roxy Manns, MD 02/23/2018 11:57 AM

## 2018-02-23 NOTE — Patient Instructions (Addendum)
  Continue taking all current medications without change through the day before surgery.  Have nothing to eat or drink after midnight the night before surgery.  On the morning of surgery take only Synthroid with a sip of water.     

## 2018-02-23 NOTE — H&P (View-Only) (Signed)
[facility]  MULTIDISCIPLINARY HEART VALVE CLINIC  CARDIOTHORACIC SURGERY CONSULTATION REPORT  Referring Provider is Revankar, Reita Cliche, MD PCP is [name]., MD  Chief Complaint  Patient presents with  . Aortic Stenosis    TAVR EVAL...all studies are completed...exercise study just prior to arrival    HPI:  Patient is a 77 year old female with history of aortic stenosis, hypertension, hyperlipidemia, and hypothyroidism who has been referred for surgical consultation to discuss treatment options for management of severe symptomatic aortic stenosis.  Patient states that she was first noted to have a heart murmur on physical exam by her primary care physician several years ago.  Echocardiogram performed August 2018 reportedly revealed normal left ventricular systolic function with moderate aortic stenosis..  Over the past 4 to 6 months the patient has developed progressive fatigue with decreased energy, decreased exercise tolerance, and substernal chest tightness brought on with physical exertion and relieved by rest.  She has recently developed some exertional shortness of breath.  She was referred to Dr. Geraldo Pitter and transthoracic echocardiogram performed February 03, 2018 revealed severe aortic stenosis with preserved left ventricular systolic function.  Diagnostic cardiac catheterization confirmed the presence of severe aortic stenosis and was notable for the absence of significant coronary artery disease.  Patient was referred to the multidisciplinary heart valve clinic and has been evaluated previously by Dr. Angelena Form.  CT angiography has been performed and the patient was referred for surgical consultation.  Patient is married and lives with her husband in the country [place].  She has been retired for more than 20 years but has remained physically active and entirely functionally independent.  She reports no significant physical limitations whatsoever until the  past 4 to 6 months.  Her biggest complaint of decreased energy and worsening exercise tolerance.  She has developed tightness across her chest that is typically brought on with physical exertion such as working in the yard or working in [employer].  Symptoms are always relieved by rest.  Recently she has began to experience some exertional shortness of breath.  She denies any resting shortness of breath, PND, orthopnea, or lower extremity edema.  She has not had palpitations, dizzy spells, nor syncope.  Her mobility is otherwise normal and she reports no physical limitations other than that related to progressive fatigue and chest tightness.  Past Medical History:  Diagnosis Date  . Anxiety   . Aortic stenosis   . Bladder problem   . Chronic reflux esophagitis   . Hypercholesterolemia   . Hypertension   . Hypothyroidism   . Osteoporosis   . Systolic murmur   . Vitamin D deficiency     Past Surgical History:  Procedure Laterality Date  . CHOLECYSTECTOMY    . FOOT SURGERY    . RIGHT/LEFT HEART CATH AND CORONARY ANGIOGRAPHY N/A 02/03/2018   Procedure: RIGHT/LEFT HEART CATH AND CORONARY ANGIOGRAPHY;  Surgeon: Martinique, Peter M, MD;  Location: [facility];  Service: Cardiovascular;  Laterality: N/A;  . TUBAL LIGATION      Family History  Problem Relation Age of Onset  . Dementia Mother   . COPD Father   . Throat cancer Brother     Social History   Socioeconomic History  . Marital status: Married    Spouse name: Not on file  . Number of children: 2  . Years of education: Not on file  . Highest education level: Not on file  Occupational History  . Occupation: [employer]  Social Needs  . Financial resource strain: Not on file  . Food insecurity:    Worry: Not on file    Inability: Not on file  . Transportation needs:    Medical: Not on file    Non-medical: Not on file  Tobacco Use  . Smoking status: Never Smoker  . Smokeless tobacco: Never Used    Substance and Sexual Activity  . Alcohol use: No  . Drug use: No  . Sexual activity: Not on file  Lifestyle  . Physical activity:    Days per week: Not on file    Minutes per session: Not on file  . Stress: Not on file  Relationships  . Social connections:    Talks on phone: Not on file    Gets together: Not on file    Attends religious service: Not on file    Active member of club or organization: Not on file    Attends meetings of clubs or organizations: Not on file    Relationship status: Not on file  . Intimate partner violence:    Fear of current or ex partner: Not on file    Emotionally abused: Not on file    Physically abused: Not on file    Forced sexual activity: Not on file  Other Topics Concern  . Not on file  Social History Narrative  . Not on file    Current Outpatient Medications  Medication Sig Dispense Refill  . aspirin EC 81 MG tablet Take 81 mg by mouth daily.    . Aspirin-Acetaminophen-Caffeine (GOODY HEADACHE PO) Take 1 packet by mouth daily as needed (headaches).    . cetirizine (ZYRTEC) 10 MG tablet Take 10 mg by mouth at bedtime.    . cholecalciferol (VITAMIN D) 1000 units tablet Take 1,000 Units by mouth daily.    Marland Kitchen escitalopram (LEXAPRO) 10 MG tablet Take 10 mg by mouth at bedtime.     Marland Kitchen levothyroxine (SYNTHROID, LEVOTHROID) 50 MCG tablet Take 50 mcg by mouth daily before breakfast.    . metoprolol succinate (TOPROL-XL) 25 MG 24 hr tablet Take 25 mg by mouth at bedtime.   3  . omeprazole (PRILOSEC) 40 MG capsule Take 40 mg by mouth daily. Take around 1300    . rosuvastatin (CRESTOR) 40 MG tablet Take 40 mg by mouth at bedtime.      No current facility-administered medications for this visit.     Allergies  Allergen Reactions  . Levaquin [Levofloxacin]     Makes her sick      Review of Systems:   General:  Normal appetite, decreased energy, + weight gain, no weight loss, no fever  Cardiac:  + chest pain with exertion, no chest pain at  rest, + SOB with exertion, no resting SOB, no PND, no orthopnea, no palpitations, no arrhythmia, no atrial fibrillation, no LE edema, no dizzy spells, no syncope  Respiratory:  no shortness of breath, no home oxygen, no productive cough, + dry cough, + bronchitis, no wheezing, no hemoptysis, no asthma, no pain with inspiration or cough, no sleep apnea, no CPAP at night  GI:   no difficulty swallowing, + reflux, no frequent heartburn, no hiatal hernia, no abdominal pain, no constipation, no diarrhea, no hematochezia, no hematemesis, no melena  GU:   no dysuria,  no frequency, no urinary tract infection, no hematuria, no kidney stones, no kidney disease  Vascular:  no pain suggestive of claudication, no pain in feet, no leg cramps, no varicose veins,  no DVT, no non-healing foot ulcer  Neuro:   no stroke, no TIA's, no seizures, + headaches, no temporary blindness one eye,  no slurred speech, no peripheral neuropathy, no chronic pain, no instability of gait, no memory/cognitive dysfunction  Musculoskeletal: no arthritis, no joint swelling, no myalgias, no difficulty walking, normal mobility   Skin:   no rash, no itching, no skin infections, no pressure sores or ulcerations  Psych:   + anxiety, no depression, no nervousness, + unusual recent stress  Eyes:   no blurry vision, no floaters, no recent vision changes, + wears glasses or contacts  ENT:   no hearing loss, no loose or painful teeth, no dentures, last saw dentist 02/19/2018  Hematologic:  + easy bruising, no abnormal bleeding, no clotting disorder, no frequent epistaxis  Endocrine:  no diabetes, does not check CBG's at home           Physical Exam:   BP 123/78 (BP Location: Right Arm, Patient Position: Sitting, Cuff Size: Large)   Pulse 84   Resp 16   Ht 5' 2.25" (1.581 m)   Wt 154 lb (69.9 kg)   SpO2 96% Comment: ON RA  BMI 27.94 kg/m   General:    well-appearing  HEENT:  Unremarkable   Neck:   no JVD, no bruits, no adenopathy    Chest:   clear to auscultation, symmetrical breath sounds, no wheezes, no rhonchi   CV:   RRR, grade III/VI crescendo/decrescendo murmur heard best at RUSB,  no diastolic murmur  Abdomen:  soft, non-tender, no masses   Extremities:  warm, well-perfused, pulses diminished, no LE edema  Rectal/GU  Deferred  Neuro:   Grossly non-focal and symmetrical throughout  Skin:   Clean and dry, no rashes, no breakdown   Diagnostic Tests:  Transthoracic Echocardiography  Patient:    Sabre, Romberger MR #:       [id number] Study Date: 02/03/2018 Gender:     F Age:        41 Height:     158.8 cm Weight:     69.9 kg BSA:        1.78 m^2 Pt. Status: Room:   ATTENDING    Peter Martinique, M.D.  ORDERING     Peter Martinique, M.D.  PERFORMING   [facility], Inpatient  SONOGRAPHER  Darlina Sicilian, RDCS  cc:  ------------------------------------------------------------------- LV EF: 60% -   65%  ------------------------------------------------------------------- Indications:      Aortic stenosis 424.1.  ------------------------------------------------------------------- History:   PMH:   Chest pain.  Risk factors:  Hypertension. Dyslipidemia.  ------------------------------------------------------------------- Study Conclusions  - Left ventricle: The cavity size was normal. Wall thickness was   normal. Systolic function was normal. The estimated ejection   fraction was in the range of 60% to 65%. Wall motion was normal;   there were no regional wall motion abnormalities. Doppler   parameters are consistent with abnormal left ventricular   relaxation (grade 1 diastolic dysfunction). Doppler parameters   are consistent with elevated mean left atrial filling pressure. - Aortic valve: Right coronary and left coronary cusp mobility was   severely restricted. There was severe stenosis. There was trivial   regurgitation. Valve area (VTI): 0.88 cm^2. Valve area (Vmax):   0.8 cm^2. Valve area  (Vmean): 0.8 cm^2. - Mitral valve: Calcified annulus. There was mild regurgitation.   Valve area by pressure half-time: 2.1 cm^2. - Left atrium: The atrium was mildly dilated.  ------------------------------------------------------------------- Study data:  No prior study was available for  comparison.  Study status:  STAT.  Procedure:  The patient reported no pain pre or post test. Transthoracic echocardiography. Image quality was adequate.  Study completion:  There were no complications. Transthoracic echocardiography.  M-mode, complete 2D, spectral Doppler, and color Doppler.  Birthdate:  Patient birthdate: Mar 22, 1941.  Age:  Patient is 77 yr old.  Sex:  Gender: female. BMI: 27.7 kg/m^2.  Blood pressure:     156/78  Patient status: Inpatient.  Study date:  Study date: 02/03/2018. Study time: 07:31 AM.  Location:  Catheterization laboratory.  -------------------------------------------------------------------  ------------------------------------------------------------------- Left ventricle:  The cavity size was normal. Wall thickness was normal. Systolic function was normal. The estimated ejection fraction was in the range of 60% to 65%. Wall motion was normal; there were no regional wall motion abnormalities. Doppler parameters are consistent with abnormal left ventricular relaxation (grade 1 diastolic dysfunction). Doppler parameters are consistent with elevated mean left atrial filling pressure.  ------------------------------------------------------------------- Aortic valve:   Trileaflet; severely thickened, moderately calcified leaflets.  Right coronary and left coronary cusp mobility was severely restricted.  Doppler:   There was severe stenosis. There was trivial regurgitation.    VTI ratio of LVOT to aortic valve: 0.31. Valve area (VTI): 0.88 cm^2. Indexed valve area (VTI): 0.5 cm^2/m^2. Peak velocity ratio of LVOT to aortic valve: 0.28. Valve area (Vmax): 0.8 cm^2.  Indexed valve area (Vmax): 0.45 cm^2/m^2. Mean velocity ratio of LVOT to aortic valve: 0.28. Valve area (Vmean): 0.8 cm^2. Indexed valve area (Vmean): 0.45 cm^2/m^2.   Mean gradient (S): 43 mm Hg. Peak gradient (S): 74 mm Hg.  ------------------------------------------------------------------- Aorta:  Aortic root: The aortic root was normal in size. Ascending aorta: The ascending aorta was normal in size.  ------------------------------------------------------------------- Mitral valve:   Calcified annulus. Leaflet separation was normal. Doppler:  Transvalvular velocity was within the normal range. There was no evidence for stenosis. There was mild regurgitation. Valve area by pressure half-time: 2.1 cm^2. Indexed valve area by pressure half-time: 1.18 cm^2/m^2.    Peak gradient (D): 6 mm Hg.   ------------------------------------------------------------------- Left atrium:  The atrium was mildly dilated.  ------------------------------------------------------------------- Right ventricle:  The cavity size was normal. Systolic function was normal.  ------------------------------------------------------------------- Pulmonic valve:    Structurally normal valve.   Cusp separation was normal.  Doppler:  Transvalvular velocity was within the normal range. There was no regurgitation.  ------------------------------------------------------------------- Tricuspid valve:   Structurally normal valve.   Leaflet separation was normal.  Doppler:  Transvalvular velocity was within the normal range. There was trivial regurgitation.  ------------------------------------------------------------------- Right atrium:  The atrium was normal in size.  ------------------------------------------------------------------- Pericardium:  There was no pericardial effusion.  ------------------------------------------------------------------- Measurements   Left ventricle                            Value           Reference  LV ID, ED, PLAX chordal          (L)     36     mm       43 - 52  LV ID, ES, PLAX chordal                  24     mm       23 - 38  LV fx shortening, PLAX chordal           33     %        >=  29  LV PW thickness, ED                      7      mm       ---------  IVS/LV PW ratio, ED              (H)     1.57            <=1.3  Stroke volume, 2D                        90     ml       ---------  Stroke volume/bsa, 2D                    51     ml/m^2   ---------  LV e&', lateral                           5.87   cm/s     ---------  LV E/e&', lateral                         20.1            ---------  LV e&', medial                            3.59   cm/s     ---------  LV E/e&', medial                          32.87           ---------  LV e&', average                           4.73   cm/s     ---------  LV E/e&', average                         24.95           ---------    Ventricular septum                       Value           Reference  IVS thickness, ED                        11.01  mm       ---------    LVOT                                     Value           Reference  LVOT ID, S                               19     mm       ---------  LVOT area                                2.84  cm^2     ---------  LVOT peak velocity, S                    121.06 cm/s     ---------  LVOT mean velocity, S                    86.6   cm/s     ---------  LVOT VTI, S                              27.95  cm       ---------  LVOT peak gradient, S                    6      mm Hg    ---------  Stroke volume (SV), LVOT DP              79.3   ml       ---------  Stroke index (SV/bsa), LVOT DP           44.6   ml/m^2   ---------    Aortic valve                             Value           Reference  Aortic valve peak velocity, S            429    cm/s     ---------  Aortic valve mean velocity, S            309    cm/s     ---------  Aortic valve VTI, S                      91.1   cm        ---------  Aortic mean gradient, S                  43     mm Hg    ---------  Aortic peak gradient, S                  74     mm Hg    ---------  VTI ratio, LVOT/AV                       0.31            ---------  Aortic valve area, VTI                   0.88   cm^2     ---------  Aortic valve area/bsa, VTI               0.5    cm^2/m^2 ---------  Velocity ratio, peak, LVOT/AV            0.28            ---------  Aortic valve area, peak velocity         0.8    cm^2     ---------  Aortic valve area/bsa, peak              0.45   cm^2/m^2 ---------  velocity  Velocity ratio, mean, LVOT/AV            0.28            ---------  Aortic valve area, mean velocity         0.8    cm^2     ---------  Aortic valve area/bsa, mean              0.45   cm^2/m^2 ---------  velocity  Aortic regurg pressure half-time         486    ms       ---------    Aorta                                    Value           Reference  Aortic root ID, ED                       29     mm       ---------  Ascending aorta ID, A-P, S               37     mm       ---------    Left atrium                              Value           Reference  LA ID, A-P, ES                           38     mm       ---------  LA ID/bsa, A-P                           2.14   cm/m^2   <=2.2  LA volume, S                             40.7   ml       ---------  LA volume/bsa, S                         22.9   ml/m^2   ---------  LA volume, ES, 1-p A4C                   38.8   ml       ---------  LA volume/bsa, ES, 1-p A4C               21.9   ml/m^2   ---------  LA volume, ES, 1-p A2C                   42.3   ml       ---------  LA volume/bsa, ES, 1-p A2C               23.8   ml/m^2   ---------    Mitral valve                             Value           Reference  Mitral E-wave peak velocity              118    cm/s     ---------  Mitral A-wave peak velocity              159    cm/s     ---------  Mitral deceleration time         (H)     359     ms       150 - 230  Mitral pressure half-time                105    ms       ---------  Mitral peak gradient, D                  6      mm Hg    ---------  Mitral E/A ratio, peak                   0.7             ---------  Mitral valve area, PHT, DP               2.1    cm^2     ---------  Mitral valve area/bsa, PHT, DP           1.18   cm^2/m^2 ---------    Pulmonary arteries                       Value           Reference  PA pressure, S, DP                       19     mm Hg    <=30    Tricuspid valve                          Value           Reference  Tricuspid regurg peak velocity           201    cm/s     ---------  Tricuspid peak RV-RA gradient            16     mm Hg    ---------    Systemic veins                           Value           Reference  Estimated CVP                            3      mm Hg    ---------    Right ventricle                          Value           Reference  TAPSE                                    19.5   mm       ---------  RV pressure, S, DP                       19     mm Hg    <=30  RV s&', lateral,  S                        14.9   cm/s     ---------  Legend: (L)  and  (H)  mark values outside specified reference range.  ------------------------------------------------------------------- Prepared and Electronically Authenticated by  Sanda Klein, MD 2019-10-15T12:12:08   RIGHT/LEFT HEART CATH AND CORONARY ANGIOGRAPHY  Conclusion     LV end diastolic pressure is normal.  There is severe aortic valve stenosis.   1. Normal coronary anatomy 2. Severe aortic stenosis. Mean gradient 33 mm Hg, ABA 0.8 cm squared, index 0.49. 3. Normal right heart pressures and cardiac output 4. Normal LV filling pressures.  Plan: refer to heart valve team for AVR.    Indications   Nonrheumatic aortic valve stenosis [I35.0 (ICD-10-CM)]  Procedural Details/Technique   Technical Details Indication: 77 yo WF presents with progressive angina.  She has severe aortic stenosis. By Echo done today peak gradient is 4.3 m/sec with mean gradient of 43 mm Hg, peak gradient 74 mm Hg, and AVA of 0.8 cm squared. LV function was normal.  Procedural Details: The right wrist was prepped, draped, and anesthetized with 1% lidocaine. Using the modified Seldinger technique a 6 Fr slender sheath was placed in the right radial artery. A 5 French sheath was placed in the left brachial vein. A Swan-Ganz catheter was used for the right heart catheterization. Standard protocol was followed for recording of right heart pressures and sampling of oxygen saturations. Fick cardiac output was calculated. Standard Judkins catheters were used for selective coronary angiography and left ventricular pressures. There were no immediate procedural complications. The patient was transferred to the post catheterization recovery area for further monitoring.  Contrast: 40 cc   Estimated blood loss <50 mL.  During this procedure the patient was administered the following to achieve and maintain moderate conscious sedation: Versed 1 mg, Fentanyl 25 mcg, while the patient's heart rate, blood pressure, and oxygen saturation were continuously monitored. The period of conscious sedation was 35 minutes, of which I was present face-to-face 100% of this time.  Complications   Complications documented before study signed (02/03/2018 10:34 AM EDT)    No complications were associated with this study.  Documented by Martinique, Peter M, MD - 02/03/2018 10:32 AM EDT    Coronary Findings   Diagnostic  Dominance: Right  Left Main  Vessel was injected. Vessel is normal in caliber. Vessel is angiographically normal.  Left Anterior Descending  Vessel was injected. Vessel is normal in caliber. Vessel is angiographically normal.  Left Circumflex  Vessel was injected. Vessel is normal in caliber. Vessel is angiographically normal.  Right Coronary Artery  Vessel was injected. Vessel is normal  in caliber. Vessel is angiographically normal.  Intervention   No interventions have been documented.  Left Heart   Left Ventricle LV end diastolic pressure is normal.  Mitral Valve The annulus is calcified.  Aortic Valve There is severe aortic valve stenosis. The aortic valve is calcified. There is restricted aortic valve motion.  Coronary Diagrams   Diagnostic Diagram       Implants    No implant documentation for this case.  MERGE Images   Show images for CARDIAC CATHETERIZATION   Link to Procedure Log   Procedure Log    Hemo Data    Most Recent Value  Fick Cardiac Output 4.65 L/min  Fick Cardiac Output Index 2.69 (L/min)/BSA  Aortic Mean Gradient 33.01 mmHg  Aortic Peak  Gradient 36 mmHg  Aortic Valve Area 0.84  Aortic Value Area Index 0.49 cm2/BSA  RA A Wave 5 mmHg  RA V Wave 2 mmHg  RA Mean 1 mmHg  RV Systolic Pressure 31 mmHg  RV Diastolic Pressure 0 mmHg  RV EDP 4 mmHg  PA Systolic Pressure 25 mmHg  PA Diastolic Pressure 5 mmHg  PA Mean 13 mmHg  PW A Wave 9 mmHg  PW V Wave 5 mmHg  PW Mean 4 mmHg  AO Systolic Pressure 102 mmHg  AO Diastolic Pressure 74 mmHg  AO Mean 585 mmHg  LV Systolic Pressure 277 mmHg  LV Diastolic Pressure 4 mmHg  LV EDP 9 mmHg  AOp Systolic Pressure 824 mmHg  AOp Diastolic Pressure 71 mmHg  AOp Mean Pressure 235 mmHg  LVp Systolic Pressure 361 mmHg  LVp Diastolic Pressure 3 mmHg  LVp EDP Pressure 9 mmHg  QP/QS 1  TPVR Index 4.84 HRUI  TSVR Index 34.64 HRUI  PVR SVR Ratio 0.1  TPVR/TSVR Ratio 0.14  Order-Level Documents:   There are no order-level documents.  Encounter-Level Documents - 02/03/2018:   Scan on 02/04/2018 3:24 PM by Default, Provider, MDScan on 02/04/2018 3:24 PM by Default, Provider, MD  Scan on 02/04/2018 3:00 PM by Default, Provider, MDScan on 02/04/2018 3:00 PM by Default, Provider, MD  Document on 02/03/2018 10:46 AM by Jonathon Jordan, RN: After Visit Summary  Document on 02/03/2018 10:46 AM by  Jonathon Jordan, RN: IP After Visit Summary  Scan on 02/03/2018 10:29 AM by Default, Provider, MDScan on 02/03/2018 10:29 AM by Default, Provider, MD  Electronic signature on 02/03/2018 5:27 AM - Signed  Electronic signature on 02/03/2018 5:26 AM - Signed      Signed   Electronically signed by Martinique, Peter M, MD on 02/03/18 at 1034 EDT     Cardiac TAVR CT  TECHNIQUE: The patient was scanned on a Siemens Force 443 slice scanner. A 120 kV retrospective scan was triggered in the ascending thoracic aorta at 140 HU's. Gantry rotation speed was 250 msecs and collimation was .6 mm. No beta blockade or nitro were given. The 3D data set was reconstructed in 5% intervals of the R-R cycle. Systolic and diastolic phases were analyzed on a dedicated work station using MPR, MIP and VRT modes. The patient received 80 cc of contrast.  FINDINGS: Aortic Valve: Tri leaflet some calcification thickened leaflet tips  Aorta: Bovine arch with mild calcific aortic atherosclerosis  Sino-tubular Junction: 25 mm  Ascending Thoracic Aorta: 35 mm  Aortic Arch: 27 mm  Descending Thoracic Aorta: 23 mm  Sinus of Valsalva Measurements:  Non-coronary: 28.4 mm  Right - coronary: 26.2 mm  Left -   coronary: 27.9 mm  Coronary Artery Height above Annulus:  Left Main: 11.25 mm above annulus  Right Coronary: 17.2 mm above annulus  Virtual Basal Annulus Measurements:  Maximum / Minimum Diameter: 23.5 mm x 17.7 mm  Perimeter: 67 mm  Area: 311-340 mm2  Coronary Arteries: Sufficient height above annulus for deployment  Optimum Fluoroscopic Angle for Delivery: LAO 25 degrees Cranial 4 degrees  IMPRESSION: 1. Trileaflet AV with annular area at most 340 mm2 lower limit suitable for a 23 mm Sapien 3 valve or a 26 mm Evolut Pro valve  2.  Normal aortic root diameter 3.5 cm with bovine arch  3.  Coronary arteries sufficient height above annulus for deployment  4. Optimum  angiographic angle for deployment LAO 25 degrees Cranial 4 degrees  5.  No LAA thrombus  Jenkins Rouge  Electronically Signed: By: Jenkins Rouge M.D. On: 02/17/2018 16:50  CT ANGIOGRAPHY CHEST, ABDOMEN AND PELVIS  TECHNIQUE: Multidetector CT imaging through the chest, abdomen and pelvis was performed using the standard protocol during bolus administration of intravenous contrast. Multiplanar reconstructed images and MIPs were obtained and reviewed to evaluate the vascular anatomy.  CONTRAST:  152mL ISOVUE-370 IOPAMIDOL (ISOVUE-370) INJECTION 76%  COMPARISON:  None.  FINDINGS: CTA CHEST FINDINGS  Cardiovascular: Normal heart size. No significant pericardial effusion/thickening. Severe thickening and calcification of the aortic valve. Right coronary atherosclerosis. Atherosclerotic nonaneurysmal thoracic aorta. Normal caliber pulmonary arteries. No central pulmonary emboli.  Mediastinum/Nodes: Hypodense subcentimeter bilateral thyroid lobe nodules. Unremarkable esophagus. No pathologically enlarged axillary, mediastinal or hilar lymph nodes.  Lungs/Pleura: No pneumothorax. No pleural effusion. Peripheral left lower lobe 3 mm solid pulmonary nodule (series 5/image 65). No acute consolidative airspace disease, lung masses or additional significant pulmonary nodules.  Musculoskeletal: No aggressive appearing focal osseous lesions. T8 vertebral hemangioma. Mild thoracic spondylosis.  CTA ABDOMEN AND PELVIS FINDINGS  Hepatobiliary: Normal liver with no liver mass. Cholecystectomy. Bile ducts are within normal post cholecystectomy limits.  Pancreas: Normal, with no mass or duct dilation.  Spleen: Normal size. No mass.  Adrenals/Urinary Tract: Normal adrenals. Normal kidneys with no hydronephrosis and no renal mass. Normal bladder.  Stomach/Bowel: Normal non-distended stomach. Normal caliber small bowel with no small bowel wall thickening. Normal  appendix. Normal large bowel with no diverticulosis, large bowel wall thickening or pericolonic fat stranding.  Vascular/Lymphatic: Atherosclerotic nonaneurysmal abdominal aorta. Patent portal, splenic and renal veins. No pathologically enlarged lymph nodes in the abdomen or pelvis.  Reproductive: Grossly normal uterus.  No adnexal mass.  Other: No pneumoperitoneum, ascites or focal fluid collection.  Musculoskeletal: No aggressive appearing focal osseous lesions. Moderate thoracic spondylosis. Small sclerotic lesion in the left superior acetabulum is nonspecific and most likely a benign bone island.  VASCULAR MEASUREMENTS PERTINENT TO TAVR:  AORTA:  Minimal Aortic Diameter-12.9 x 11.7 mm (infrarenal abdominal aorta on series 4/image 121)  Severity of Aortic Calcification-moderate  RIGHT PELVIS:  Right Common Iliac Artery -  Minimal Diameter-7.9 x 7.0 mm  Tortuosity-mild  Calcification-moderate  Right External Iliac Artery -  Minimal Diameter-5.4 x 5.1 mm  Tortuosity-moderate  Calcification-none  Right Common Femoral Artery -  Minimal Diameter-5.7 x 5.1 mm  Tortuosity-mild  Calcification-mild  LEFT PELVIS:  Left Common Iliac Artery -  Minimal Diameter-6.6 x 5.5 mm  Tortuosity-mild  Calcification-moderate  Left External Iliac Artery -  Minimal Diameter-5.6 x 5.6 mm  Tortuosity-moderate  Calcification-mild  Left Common Femoral Artery -  Minimal Diameter-6.2 x 5.7 mm  Tortuosity-mild  Calcification-mild  Review of the MIP images confirms the above findings.  IMPRESSION: 1. Vascular findings and measurements pertinent to potential TAVR procedure, as detailed. 2. Severe thickening and calcification of the aortic valve, compatible with the reported clinical history of severe aortic stenosis. 3. Aortic Atherosclerosis (ICD10-I70.0). 4. One vessel coronary atherosclerosis. 5. Solitary 3 mm solid  pulmonary nodule. No follow-up needed if patient is low-risk. Non-contrast chest CT can be considered in 12 months if patient is high-risk. This recommendation follows the consensus statement: Guidelines for Management of Incidental Pulmonary Nodules Detected on CT Images:From the Fleischner Society 2017; published online before print (10.1148/radiol.3716967893).   Electronically Signed   By: Ilona Sorrel M.D.   On: 02/18/2018 09:18  Impression:  Patient has stage D severe symptomatic aortic stenosis.  She describes gradual progression of symptoms of worsening  fatigue, decreased energy, exertional chest tightness, and exertional shortness of breath consistent with stable angina pectoris and chronic diastolic congestive heart failure, New York Heart Association functional class II.  I have personally reviewed the patient's recent transthoracic echocardiogram, diagnostic cardiac catheterization, and CT angiograms.  Echocardiogram confirmed the presence of normal left ventricular systolic function with severe aortic stenosis.  The aortic valve is trileaflet.  There is severe thickening, fibrosis, calcification, and restricted leaflet mobility involving all 3 leaflets of the patient's aortic valve.  Peak velocity across aortic valve measured 4.3 m/s corresponding to mean transvalvular gradient estimated 43 mmHg.  Diagnostic cardiac catheterization confirmed the presence of severe aortic stenosis and revealed normal coronary artery anatomy with no significant coronary artery disease.  Right heart pressures were normal.  I agree the patient would benefit from elective aortic valve replacement.  Cardiac-gated CTA of the heart reveals anatomical characteristics consistent with aortic stenosis suitable for treatment by transcatheter aortic valve replacement without any significant complicating features and CTA of the aorta and iliac vessels demonstrate what appears to be adequate pelvic vascular access to  facilitate a transfemoral approach.    Plan:  The patient and her family were counseled at length regarding treatment alternatives for management of severe symptomatic aortic stenosis. Alternative approaches such as conventional aortic valve replacement, transcatheter aortic valve replacement, and continued medical therapy without intervention were compared and contrasted at length.  The risks associated with conventional surgical aortic valve replacement were discussed in detail, as were expectations for post-operative convalescence.  Issues specific to transcatheter aortic valve replacement were discussed including questions about long term valve durability, the potential for paravalvular leak, possible increased risk of need for permanent pacemaker placement, and other technical complications related to the procedure itself.  Long-term prognosis with medical therapy was discussed. This discussion was placed in the context of the patient's own specific clinical presentation and past medical history.  All of their questions have been addressed.  The patient desires to proceed with transcatheter aortic valve replacement as soon as practical.  We tentatively plan to proceed with surgery on Tuesday, March 03, 2018.  Following the decision to proceed with transcatheter aortic valve replacement, a discussion has been held regarding what types of management strategies would be attempted intraoperatively in the event of life-threatening complications, including whether or not the patient would be considered a candidate for the use of cardiopulmonary bypass and/or conversion to open sternotomy for attempted surgical intervention.  The patient has been advised of a variety of complications that might develop including but not limited to risks of death, stroke, paravalvular leak, aortic dissection or other major vascular complications, aortic annulus rupture, device embolization, cardiac rupture or perforation,  mitral regurgitation, acute myocardial infarction, arrhythmia, heart block or bradycardia requiring permanent pacemaker placement, congestive heart failure, respiratory failure, renal failure, pneumonia, infection, other late complications related to structural valve deterioration or migration, or other complications that might ultimately cause a temporary or permanent loss of functional independence or other long term morbidity.  The patient provides full informed consent for the procedure as described and all questions were answered.   I spent in excess of 90 minutes during the conduct of this office consultation and >50% of this time involved direct face-to-face encounter with the patient for counseling and/or coordination of their care.    Valentina Gu. Roxy Manns, MD 02/23/2018 11:57 AM

## 2018-02-23 NOTE — Therapy (Signed)
[place], [place], [address] Phone: [phone]   Fax:  [phone]  Physical Therapy Evaluation  Patient Details  Name: Shannon Mills MRN: [id number] Date of Birth: [birth year] Referring Provider (PT): Dr Latrelle Dodrill    Encounter Date: 02/23/2018  PT End of Session - 02/23/18 1129    Visit Number  1    Number of Visits  1    Date for PT Re-Evaluation  02/23/18    Authorization Type  [employer]    PT Start Time  1015    PT Stop Time  1040    PT Time Calculation (min)  25 min    Activity Tolerance  Patient tolerated treatment well    Behavior During Therapy  [facility] for tasks assessed/performed       Past Medical History:  Diagnosis Date  . Anxiety   . Aortic stenosis   . Bladder problem   . Chronic reflux esophagitis   . Hypercholesterolemia   . Hypertension   . Hypothyroidism   . Osteoporosis   . Systolic murmur   . Vitamin D deficiency     Past Surgical History:  Procedure Laterality Date  . CHOLECYSTECTOMY    . FOOT SURGERY    . RIGHT/LEFT HEART CATH AND CORONARY ANGIOGRAPHY N/A 02/03/2018   Procedure: RIGHT/LEFT HEART CATH AND CORONARY ANGIOGRAPHY;  Surgeon: Martinique, Peter M, MD;  Location: [place];  Service: Cardiovascular;  Laterality: N/A;  . TUBAL LIGATION      There were no vitals filed for this visit.   Subjective Assessment - 02/23/18 1017    Subjective  Patient has been having fatigue sicne the summer. It has been becoming worse and worse. She has sonme tigghtening in her chest when she uses her arms a lot.     Currently in Pain?  No/denies         [facility] PT Assessment - 02/23/18 0001      Assessment   Medical Diagnosis  Severe Arotic Stenosis     Referring Provider (PT)  Dr Latrelle Dodrill     Onset Date/Surgical Date  --   3 minths prior      ROM / Strength   AROM / PROM / Strength  AROM;PROM;Strength      AROM   Overall AROM Comments  full  active motion of her hips and legs       Strength   Overall Strength Comments  gorss UE / LE strength WNL     Strength Assessment Site  Hand    Right/Left hand  Right;Left    Right Hand Grip (lbs)  35    Left Hand Grip (lbs)  30       [facility] Pre-Surgical Assessment - 02/23/18 0001    5 Meter Walk Test- trial 1  5 sec    5 Meter Walk Test- trial 2  5 sec.     5 Meter Walk Test- trial 3  5 sec.    5 meter walk test average  5 sec    4 Stage Balance Test Position  2    comment  mod a for full tandem     Sit To Stand Test- trial 1  4 sec.    Comment  in 14 seconds but became fatigued     ADL/IADL Independent with:  Dressing;Bathing;Meal prep;Finances    ADL/IADL Needs Assistance with:  [employer] work    6 Minute Walk- Baseline  yes  BP (mmHg)  143/69    HR (bpm)  62    02 Sat (%RA)  96 %    Modified Borg Scale for Dyspnea  0- Nothing at all    Perceived Rate of Exertion (Borg)  6-    6 Minute Walk Post Test  yes    BP (mmHg)  (!) 162/91    HR (bpm)  62    02 Sat (%RA)  94 %    Modified Borg Scale for Dyspnea  2- Mild shortness of breath    Perceived Rate of Exertion (Borg)  11- Fairly light    Aerobic Endurance Distance Walked  750    Endurance additional comments  setaed rest breat at 4 minutes for 40 seconds               Objective measurements completed on examination: See above findings.              PT Education - 02/23/18 1128    Education Details  reviewed                   Plan - 02/23/18 1131    Clinical Impression Statement  See below     Clinical Presentation  Stable    Clinical Decision Making  Low    Rehab Potential  Good    PT Frequency  One time visit    Consulted and Agree with Plan of Care  Patient      Clinical Impression Statement: Pt is a 77 yo female presenting to OP PT for evaluation prior to possible TAVR surgery due to severe aortic stenosis. Pt reports onset of dysnea approximately 3-4 months ago. Symptoms are  limiting her ability to ambulate in the community, and perform ADL's . Pt presents with normal ROM and strength, decreased balance and is low  at high fall risk 4 stage balance test,normal walking speed and decreased aerobic endurance per 6 minute walk test. Pt ambulated 555 feet in 4 before requesting a seated rest beak lasting 40 sec. At time of rest, patient's HR was 68 bpm and O2 was 94 on room air. Pt reported 3/10 shortness of breath on modified scale for dyspnea. Pt able to resume after rest and ambulate an additional 195 feet. Pt ambulated a total of 750 feet in 6 minute walk. B/P  increased significantly with 6 minute walk test. Based on the Short Physical Performance Battery, patient has a frailty rating of 5/12 with </= 5/12 considered frail.   Patient will benefit from skilled therapeutic intervention in order to improve the following deficits and impairments:     Visit Diagnosis: Other abnormalities of gait and mobility     Problem List Patient Active Problem List   Diagnosis Date Noted  . Fatigue 01/29/2018  . Aortic stenosis 01/29/2018  . Angina pectoris ([place]) 01/29/2018  . Essential hypertension 01/29/2018  . Mixed dyslipidemia 01/29/2018  . 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

## 2018-02-24 ENCOUNTER — Encounter: Payer: Self-pay | Admitting: Thoracic Surgery (Cardiothoracic Vascular Surgery)

## 2018-02-26 ENCOUNTER — Ambulatory Visit: Payer: Medicare Other | Admitting: Cardiology

## 2018-02-26 NOTE — Pre-Procedure Instructions (Signed)
Sylvester Salonga Nordmann  02/26/2018      [id number] - , [place] - [place] [address] [place] [address] Phone: [phone] Fax: [phone]    Your procedure is scheduled on Tuesday November 12.  Report to [facility] Admitting at 8:00 A.M.  Call this number if you have problems the morning of surgery:  [phone]   Remember:  Do not eat or drink after midnight.    Take these medicines the morning of surgery with A SIP OF WATER: Synthroid (levothyroxine)  7 days prior to surgery STOP taking any Aleve, Naproxen, Ibuprofen, Motrin, Advil, Goody's, BC's, all herbal medications, fish oil, and all vitamins     Do not wear jewelry, make-up or nail polish.  Do not wear lotions, powders, or perfumes, or deodorant.  Do not shave 48 hours prior to surgery.  Men may shave face and neck.  Do not bring valuables to the hospital.  [facility] is not responsible for any belongings or valuables.  Contacts, dentures or bridgework may not be worn into surgery.  Leave your suitcase in the car.  After surgery it may be brought to your room.  For patients admitted to the hospital, discharge time will be determined by your treatment team.  Patients discharged the day of surgery will not be allowed to drive home.    Special instructions:    [place]- Preparing For Surgery  Before surgery, you can play an important role. Because skin is not sterile, your skin needs to be as free of germs as possible. You can reduce the number of germs on your skin by washing with CHG (chlorahexidine gluconate) Soap before surgery.  CHG is an antiseptic cleaner which kills germs and bonds with the skin to continue killing germs even after washing.    Oral Hygiene is also important to reduce your risk of infection.  Remember - BRUSH YOUR TEETH THE MORNING OF SURGERY WITH YOUR REGULAR TOOTHPASTE  Please do not use if you have an allergy to CHG or  antibacterial soaps. If your skin becomes reddened/irritated stop using the CHG.  Do not shave (including legs and underarms) for at least 48 hours prior to first CHG shower. It is OK to shave your face.  Please follow these instructions carefully.   1. Shower the NIGHT BEFORE SURGERY and the MORNING OF SURGERY with CHG.   2. If you chose to wash your hair, wash your hair first as usual with your normal shampoo.  3. After you shampoo, rinse your hair and body thoroughly to remove the shampoo.  4. Use CHG as you would any other liquid soap. You can apply CHG directly to the skin and wash gently with a scrungie or a clean washcloth.   5. Apply the CHG Soap to your body ONLY FROM THE NECK DOWN.  Do not use on open wounds or open sores. Avoid contact with your eyes, ears, mouth and genitals (private parts). Wash Face and genitals (private parts)  with your normal soap.  6. Wash thoroughly, paying special attention to the area where your surgery will be performed.  7. Thoroughly rinse your body with warm water from the neck down.  8. DO NOT shower/wash with your normal soap after using and rinsing off the CHG Soap.  9. Pat yourself dry with a CLEAN TOWEL.  10. Wear CLEAN PAJAMAS to bed the night before surgery, wear comfortable clothes the morning  of surgery  11. Place CLEAN SHEETS on your bed the night of your first shower and DO NOT SLEEP WITH PETS.    Day of Surgery:  Do not apply any deodorants/lotions.  Please wear clean clothes to the hospital/surgery center.   Remember to brush your teeth WITH YOUR REGULAR TOOTHPASTE.    Please read over the following fact sheets that you were given. Coughing and Deep Breathing, MRSA Information and Surgical Site Infection Prevention

## 2018-02-27 ENCOUNTER — Other Ambulatory Visit: Payer: Self-pay

## 2018-02-27 ENCOUNTER — Encounter (HOSPITAL_COMMUNITY): Payer: Self-pay

## 2018-02-27 ENCOUNTER — Ambulatory Visit (HOSPITAL_COMMUNITY)
Admission: RE | Admit: 2018-02-27 | Discharge: 2018-02-27 | Disposition: A | Discharge status: Home / Self Care | Payer: Medicare Other | Source: Ambulatory Visit | Attending: Cardiovascular Disease | Admitting: Cardiovascular Disease

## 2018-02-27 ENCOUNTER — Encounter (HOSPITAL_COMMUNITY)
Admission: RE | Admit: 2018-02-27 | Discharge: 2018-02-27 | Disposition: A | Discharge status: Home / Self Care | Payer: Medicare Other | Source: Ambulatory Visit | Attending: Cardiovascular Disease | Admitting: Cardiovascular Disease

## 2018-02-27 DIAGNOSIS — Z79899 Other long term (current) drug therapy: Secondary | ICD-10-CM | POA: Diagnosis not present

## 2018-02-27 DIAGNOSIS — E7800 Pure hypercholesterolemia, unspecified: Secondary | ICD-10-CM | POA: Diagnosis not present

## 2018-02-27 DIAGNOSIS — R001 Bradycardia, unspecified: Secondary | ICD-10-CM | POA: Diagnosis not present

## 2018-02-27 DIAGNOSIS — I350 Nonrheumatic aortic (valve) stenosis: Secondary | ICD-10-CM | POA: Insufficient documentation

## 2018-02-27 DIAGNOSIS — R9431 Abnormal electrocardiogram [ECG] [EKG]: Secondary | ICD-10-CM | POA: Insufficient documentation

## 2018-02-27 DIAGNOSIS — E559 Vitamin D deficiency, unspecified: Secondary | ICD-10-CM | POA: Insufficient documentation

## 2018-02-27 DIAGNOSIS — R5383 Other fatigue: Secondary | ICD-10-CM | POA: Diagnosis not present

## 2018-02-27 DIAGNOSIS — I700 Atherosclerosis of aorta: Secondary | ICD-10-CM | POA: Diagnosis not present

## 2018-02-27 DIAGNOSIS — E039 Hypothyroidism, unspecified: Secondary | ICD-10-CM | POA: Diagnosis not present

## 2018-02-27 DIAGNOSIS — F419 Anxiety disorder, unspecified: Secondary | ICD-10-CM | POA: Insufficient documentation

## 2018-02-27 DIAGNOSIS — M810 Age-related osteoporosis without current pathological fracture: Secondary | ICD-10-CM | POA: Insufficient documentation

## 2018-02-27 DIAGNOSIS — I10 Essential (primary) hypertension: Secondary | ICD-10-CM | POA: Insufficient documentation

## 2018-02-27 DIAGNOSIS — Z01818 Encounter for other preprocedural examination: Secondary | ICD-10-CM | POA: Diagnosis not present

## 2018-02-27 DIAGNOSIS — Z79890 Hormone replacement therapy (postmenopausal): Secondary | ICD-10-CM | POA: Diagnosis not present

## 2018-02-27 DIAGNOSIS — Z7982 Long term (current) use of aspirin: Secondary | ICD-10-CM | POA: Diagnosis not present

## 2018-02-27 DIAGNOSIS — Z9049 Acquired absence of other specified parts of digestive tract: Secondary | ICD-10-CM | POA: Diagnosis not present

## 2018-02-27 LAB — HEMOGLOBIN A1C
HEMOGLOBIN A1C: 5.2 % (ref 4.8–5.6)
MEAN PLASMA GLUCOSE: 102.54 mg/dL

## 2018-02-27 LAB — PROTIME-INR
INR: 1.01
PROTHROMBIN TIME: 13.2 s (ref 11.4–15.2)

## 2018-02-27 LAB — TYPE AND SCREEN
ABO/RH(D): O POS
Antibody Screen: NEGATIVE

## 2018-02-27 LAB — COMPREHENSIVE METABOLIC PANEL
ALK PHOS: 43 U/L (ref 38–126)
ALT: 42 U/L (ref 0–44)
ANION GAP: 8 (ref 5–15)
AST: 41 U/L (ref 15–41)
Albumin: 4.1 g/dL (ref 3.5–5.0)
BILIRUBIN TOTAL: 0.6 mg/dL (ref 0.3–1.2)
BUN: 18 mg/dL (ref 8–23)
CALCIUM: 8.9 mg/dL (ref 8.9–10.3)
CO2: 24 mmol/L (ref 22–32)
Chloride: 102 mmol/L (ref 98–111)
Creatinine, Ser: 1.04 mg/dL — ABNORMAL HIGH (ref 0.44–1.00)
GFR, EST AFRICAN AMERICAN: 59 mL/min — AB (ref >60)
GFR, EST NON AFRICAN AMERICAN: 50 mL/min — AB (ref >60)
Glucose, Bld: 94 mg/dL (ref 70–99)
POTASSIUM: 3.8 mmol/L (ref 3.5–5.1)
Sodium: 134 mmol/L — ABNORMAL LOW (ref 135–145)
TOTAL PROTEIN: 6.8 g/dL (ref 6.5–8.1)

## 2018-02-27 LAB — CBC
HEMATOCRIT: 41.4 % (ref 36.0–46.0)
HEMOGLOBIN: 13.7 g/dL (ref 12.0–15.0)
MCH: 29.8 pg (ref 26.0–34.0)
MCHC: 33.1 g/dL (ref 30.0–36.0)
MCV: 90.2 fL (ref 80.0–100.0)
Platelets: 164 10*3/uL (ref 150–400)
RBC: 4.59 MIL/uL (ref 3.87–5.11)
RDW: 13.5 % (ref 11.5–15.5)
WBC: 5.3 10*3/uL (ref 4.0–10.5)
nRBC: 0 % (ref 0.0–0.2)

## 2018-02-27 LAB — URINALYSIS, ROUTINE W REFLEX MICROSCOPIC
BILIRUBIN URINE: NEGATIVE
GLUCOSE, UA: NEGATIVE mg/dL
KETONES UR: NEGATIVE mg/dL
LEUKOCYTES UA: NEGATIVE
NITRITE: NEGATIVE
PH: 6 (ref 5.0–8.0)
PROTEIN: NEGATIVE mg/dL
Specific Gravity, Urine: 1.002 — ABNORMAL LOW (ref 1.005–1.030)

## 2018-02-27 LAB — BRAIN NATRIURETIC PEPTIDE: B Natriuretic Peptide: 75.7 pg/mL (ref 0.0–100.0)

## 2018-02-27 LAB — ABO/RH: ABO/RH(D): O POS

## 2018-02-27 LAB — SURGICAL PCR SCREEN
MRSA, PCR: NEGATIVE
STAPHYLOCOCCUS AUREUS: NEGATIVE

## 2018-02-27 LAB — APTT: aPTT: 32 seconds (ref 24–36)

## 2018-02-27 MED ORDER — CHLORHEXIDINE GLUCONATE 4 % EX LIQD: 60.0000 mL | Freq: Once | CUTANEOUS | Status: DC

## 2018-02-27 NOTE — Progress Notes (Addendum)
PCP: Wende Neighbors, MD  Cardiologist: Peter Martinique, MD  EKG: 02/27/18 at PAT appt  Stress test: 12/16/16 in EPIC  ECHO: 02/03/18 in EPIC  Cardiac Cath: 02/03/18 in EPIC  Chest x-ray: 02/27/18 at PAT appt  Lab unable to obtain ABG despite multiple attempts. Order entered for STAT ABG the morning of surgery.

## 2018-03-02 MED ORDER — DOPAMINE-DEXTROSE 3.2-5 MG/ML-% IV SOLN
0.0000 ug/kg/min | INTRAVENOUS | Status: DC
  Filled 2018-03-02: qty 250

## 2018-03-02 MED ORDER — DEXMEDETOMIDINE HCL IN NACL 400 MCG/100ML IV SOLN
0.1000 ug/kg/h | INTRAVENOUS | Status: AC
  Administered 2018-03-03: 1 ug/kg/h via INTRAVENOUS
  Filled 2018-03-02: qty 100

## 2018-03-02 MED ORDER — NOREPINEPHRINE 4 MG/250ML-% IV SOLN
0.0000 ug/min | INTRAVENOUS | Status: AC
  Administered 2018-03-03: 4 ug/min via INTRAVENOUS
  Filled 2018-03-02 (×2): qty 250

## 2018-03-02 MED ORDER — POTASSIUM CHLORIDE 2 MEQ/ML IV SOLN
80.0000 meq | INTRAVENOUS | Status: DC
  Filled 2018-03-02: qty 40

## 2018-03-02 MED ORDER — INSULIN REGULAR(HUMAN) IN NACL 100-0.9 UT/100ML-% IV SOLN
INTRAVENOUS | Status: DC
  Filled 2018-03-02: qty 100

## 2018-03-02 MED ORDER — PHENYLEPHRINE HCL-NACL 20-0.9 MG/250ML-% IV SOLN
30.0000 ug/min | INTRAVENOUS | Status: DC
  Filled 2018-03-02 (×2): qty 250

## 2018-03-02 MED ORDER — MAGNESIUM SULFATE 50 % IJ SOLN
40.0000 meq | INTRAMUSCULAR | Status: DC
  Filled 2018-03-02: qty 9.85

## 2018-03-02 MED ORDER — NITROGLYCERIN IN D5W 200-5 MCG/ML-% IV SOLN
2.0000 ug/min | INTRAVENOUS | Status: DC
  Filled 2018-03-02: qty 250

## 2018-03-02 MED ORDER — SODIUM CHLORIDE 0.9 % IV SOLN
1.5000 g | INTRAVENOUS | Status: AC
  Administered 2018-03-03: 1.5 g via INTRAVENOUS
  Filled 2018-03-02: qty 1.5

## 2018-03-02 MED ORDER — EPINEPHRINE PF 1 MG/ML IJ SOLN
0.0000 ug/min | INTRAVENOUS | Status: DC
  Filled 2018-03-02: qty 4

## 2018-03-02 MED ORDER — SODIUM CHLORIDE 0.9 % IV SOLN
INTRAVENOUS | Status: DC
  Filled 2018-03-02: qty 30

## 2018-03-02 MED ORDER — VANCOMYCIN HCL 10 G IV SOLR
1250.0000 mg | INTRAVENOUS | Status: AC
  Administered 2018-03-03: 1250 mg via INTRAVENOUS
  Filled 2018-03-02: qty 1250

## 2018-03-02 NOTE — Progress Notes (Addendum)
Anesthesia Chart Review:  Case:  [id number] Date/Time:  03/03/18 0945   Procedures:      TRANSCATHETER AORTIC VALVE REPLACEMENT, TRANSFEMORAL (N/A Chest)     TRANSESOPHAGEAL ECHOCARDIOGRAM (TEE) (N/A )   Anesthesia type:  General   Pre-op diagnosis:  Severe Aortic Stenosis   Location:  [facility] OR ROOM 16 / [facility]   Surgeon:  Burnell Blanks, MD      DISCUSSION: Patient is a 77 year old scheduled for the above procedure. History includes severe AS, HTN, hypothyroidism, hypercholesterolemia, reflux esophagitis, never smoker. Normal coronaries by 02/03/18 cath.   If no acute changes then I anticipate that she can proceed as planned. Unable to obtain ABG at PAT, so this will need to be done on the day of surgery.   VS: BP 140/61   Pulse (!) 56   Temp (!) 36.3 C   Resp 18   Ht 5' 0.5" (1.537 m)   Wt 70 kg   SpO2 99%   BMI 29.66 kg/m    PROVIDERS: [name]., MD is PCP Jyl Heinz, MD is primary cardiologist      LABS: Labs reviewed: Acceptable for surgery. (all labs ordered are listed, but only abnormal results are displayed)  Labs Reviewed  COMPREHENSIVE METABOLIC PANEL - Abnormal; Notable for the following components:      Result Value   Sodium 134 (*)    Creatinine, Ser 1.04 (*)    GFR calc non Af Amer 50 (*)    GFR calc Af Amer 59 (*)    All other components within normal limits  URINALYSIS, ROUTINE W REFLEX MICROSCOPIC - Abnormal; Notable for the following components:   Color, Urine COLORLESS (*)    Specific Gravity, Urine 1.002 (*)    Hgb urine dipstick MODERATE (*)    Bacteria, UA RARE (*)    All other components within normal limits  SURGICAL PCR SCREEN  APTT  BRAIN NATRIURETIC PEPTIDE  CBC  HEMOGLOBIN A1C  PROTIME-INR  TYPE AND SCREEN  ABO/RH   PFTs 02/17/18: FVC 2.73 (109%), FEV1 2.12 (114%), DLCO unc 17.36 (80%).   EKG: 02/27/18: NSR   CV/IMAGING:  Recent echo, cardiac cath, cardiac CT, CT chest/abd/pelvis are outlined in Darylene Price, MD  02/23/18 note or can be viewed in Results tab.  CXR 02/27/18: IMPRESSION: 1. No acute cardiopulmonary process. Aortic Atherosclerosis (ICD10-I70.0).  Carotid U/S 02/17/18: Summary: Right Carotid: Velocities in the right ICA are consistent with a 1-39% stenosis. Left Carotid: Velocities in the left ICA are consistent with a 1-39% stenosis. Vertebrals: Bilateral vertebral arteries demonstrate antegrade flow.   Past Medical History:  Diagnosis Date  . Anxiety   . Aortic stenosis   . Bladder problem   . Chronic reflux esophagitis   . Hypercholesterolemia   . Hypertension   . Hypothyroidism   . Osteoporosis   . Systolic murmur   . Vitamin D deficiency     Past Surgical History:  Procedure Laterality Date  . CHOLECYSTECTOMY    . FOOT SURGERY    . RIGHT/LEFT HEART CATH AND CORONARY ANGIOGRAPHY N/A 02/03/2018   Procedure: RIGHT/LEFT HEART CATH AND CORONARY ANGIOGRAPHY;  Surgeon: Martinique, Peter M, MD;  Location: [facility];  Service: Cardiovascular;  Laterality: N/A;  . TUBAL LIGATION      MEDICATIONS: . aspirin EC 81 MG tablet  . Aspirin-Acetaminophen-Caffeine (GOODY HEADACHE PO)  . cetirizine (ZYRTEC) 10 MG tablet  . cholecalciferol (VITAMIN D) 1000 units tablet  . escitalopram (LEXAPRO) 10  MG tablet  . levothyroxine (SYNTHROID, LEVOTHROID) 50 MCG tablet  . metoprolol succinate (TOPROL-XL) 25 MG 24 hr tablet  . omeprazole (PRILOSEC) 40 MG capsule  . rosuvastatin (CRESTOR) 40 MG tablet   No current facility-administered medications for this encounter.    Derrill Memo ON 03/03/2018] cefUROXime (ZINACEF) 1.5 g in sodium chloride 0.9 % 100 mL IVPB  . [START ON 03/03/2018] dexmedetomidine (PRECEDEX) 400 MCG/100ML (4 mcg/mL) infusion  . [START ON 03/03/2018] DOPamine (INTROPIN) 800 mg in dextrose 5 % 250 mL (3.2 mg/mL) infusion  . [START ON 03/03/2018] EPINEPHrine (ADRENALIN) 4 mg in dextrose 5 % 250 mL (0.016 mg/mL) infusion  . [START ON 03/03/2018] heparin 30,000 units/NS  1000 mL solution for CELLSAVER  . [START ON 03/03/2018] insulin regular, human (MYXREDLIN) 100 units/ 100 mL infusion  . [START ON 03/03/2018] magnesium sulfate (IV Push/IM) injection 40 mEq  . [START ON 03/03/2018] nitroGLYCERIN 50 mg in dextrose 5 % 250 mL (0.2 mg/mL) infusion  . [START ON 03/03/2018] norepinephrine (LEVOPHED) 4mg  in D5W 291mL premix infusion  . [START ON 03/03/2018] phenylephrine (NEOSYNEPHRINE) 20-0.9 MG/250ML-% infusion  . [START ON 03/03/2018] potassium chloride injection 80 mEq  . [START ON 03/03/2018] vancomycin (VANCOCIN) 1,250 mg in sodium chloride 0.9 % 250 mL IVPB    George Hugh [facility] Short Stay Center/Anesthesiology Phone [phone] 03/02/2018 10:22 AM

## 2018-03-03 ENCOUNTER — Ambulatory Visit (HOSPITAL_COMMUNITY): Payer: Medicare Other

## 2018-03-03 ENCOUNTER — Inpatient Hospital Stay (HOSPITAL_COMMUNITY)
Admission: RE | Admit: 2018-03-03 | Discharge: 2018-03-04 | DRG: 267 | Disposition: A | Discharge status: Home / Self Care | Payer: Medicare Other | Attending: Cardiovascular Disease | Admitting: Cardiovascular Disease

## 2018-03-03 ENCOUNTER — Other Ambulatory Visit: Payer: Self-pay

## 2018-03-03 ENCOUNTER — Encounter (HOSPITAL_COMMUNITY): Payer: Self-pay | Admitting: Surgery

## 2018-03-03 ENCOUNTER — Encounter (HOSPITAL_COMMUNITY)
Admission: RE | Disposition: A | Discharge status: Home / Self Care | Payer: Self-pay | Source: Home / Self Care | Attending: Cardiovascular Disease

## 2018-03-03 ENCOUNTER — Inpatient Hospital Stay (HOSPITAL_COMMUNITY): Payer: Medicare Other

## 2018-03-03 ENCOUNTER — Inpatient Hospital Stay (HOSPITAL_COMMUNITY): Payer: Medicare Other | Admitting: Vascular Surgery

## 2018-03-03 DIAGNOSIS — E559 Vitamin D deficiency, unspecified: Secondary | ICD-10-CM | POA: Diagnosis present

## 2018-03-03 DIAGNOSIS — Z7982 Long term (current) use of aspirin: Secondary | ICD-10-CM | POA: Diagnosis not present

## 2018-03-03 DIAGNOSIS — Z452 Encounter for adjustment and management of vascular access device: Secondary | ICD-10-CM | POA: Diagnosis not present

## 2018-03-03 DIAGNOSIS — Z79890 Hormone replacement therapy (postmenopausal): Secondary | ICD-10-CM | POA: Diagnosis not present

## 2018-03-03 DIAGNOSIS — I10 Essential (primary) hypertension: Secondary | ICD-10-CM | POA: Diagnosis present

## 2018-03-03 DIAGNOSIS — K2100 Gastro-esophageal reflux disease with esophagitis, without bleeding: Secondary | ICD-10-CM | POA: Diagnosis present

## 2018-03-03 DIAGNOSIS — M810 Age-related osteoporosis without current pathological fracture: Secondary | ICD-10-CM | POA: Diagnosis present

## 2018-03-03 DIAGNOSIS — I350 Nonrheumatic aortic (valve) stenosis: Secondary | ICD-10-CM

## 2018-03-03 DIAGNOSIS — I25110 Atherosclerotic heart disease of native coronary artery with unstable angina pectoris: Secondary | ICD-10-CM | POA: Diagnosis present

## 2018-03-03 DIAGNOSIS — Z79899 Other long term (current) drug therapy: Secondary | ICD-10-CM | POA: Diagnosis not present

## 2018-03-03 DIAGNOSIS — E782 Mixed hyperlipidemia: Secondary | ICD-10-CM | POA: Diagnosis not present

## 2018-03-03 DIAGNOSIS — E7800 Pure hypercholesterolemia, unspecified: Secondary | ICD-10-CM | POA: Diagnosis present

## 2018-03-03 DIAGNOSIS — E785 Hyperlipidemia, unspecified: Secondary | ICD-10-CM | POA: Diagnosis present

## 2018-03-03 DIAGNOSIS — K219 Gastro-esophageal reflux disease without esophagitis: Secondary | ICD-10-CM | POA: Diagnosis present

## 2018-03-03 DIAGNOSIS — Z952 Presence of prosthetic heart valve: Secondary | ICD-10-CM | POA: Diagnosis not present

## 2018-03-03 DIAGNOSIS — E039 Hypothyroidism, unspecified: Secondary | ICD-10-CM | POA: Diagnosis present

## 2018-03-03 DIAGNOSIS — Z881 Allergy status to other antibiotic agents status: Secondary | ICD-10-CM | POA: Diagnosis not present

## 2018-03-03 DIAGNOSIS — Z006 Encounter for examination for normal comparison and control in clinical research program: Secondary | ICD-10-CM | POA: Diagnosis not present

## 2018-03-03 DIAGNOSIS — F419 Anxiety disorder, unspecified: Secondary | ICD-10-CM | POA: Diagnosis present

## 2018-03-03 DIAGNOSIS — R011 Cardiac murmur, unspecified: Secondary | ICD-10-CM | POA: Diagnosis present

## 2018-03-03 HISTORY — PX: TRANSCATHETER AORTIC VALVE REPLACEMENT, TRANSFEMORAL: SHX6400

## 2018-03-03 HISTORY — DX: Presence of prosthetic heart valve: Z95.2

## 2018-03-03 HISTORY — PX: INTRAOPERATIVE TRANSTHORACIC ECHOCARDIOGRAM: SHX6523

## 2018-03-03 HISTORY — DX: Nonrheumatic aortic (valve) stenosis: I35.0

## 2018-03-03 LAB — POCT I-STAT, CHEM 8
BUN: 17 mg/dL (ref 8–23)
BUN: 17 mg/dL (ref 8–23)
BUN: 17 mg/dL (ref 8–23)
CHLORIDE: 105 mmol/L (ref 98–111)
CHLORIDE: 105 mmol/L (ref 98–111)
Calcium, Ion: 1.23 mmol/L (ref 1.15–1.40)
Calcium, Ion: 1.24 mmol/L (ref 1.15–1.40)
Calcium, Ion: 1.26 mmol/L (ref 1.15–1.40)
Chloride: 106 mmol/L (ref 98–111)
Creatinine, Ser: 0.8 mg/dL (ref 0.44–1.00)
Creatinine, Ser: 0.9 mg/dL (ref 0.44–1.00)
Creatinine, Ser: 0.9 mg/dL (ref 0.44–1.00)
Glucose, Bld: 106 mg/dL — ABNORMAL HIGH (ref 70–99)
Glucose, Bld: 122 mg/dL — ABNORMAL HIGH (ref 70–99)
Glucose, Bld: 117 mg/dL — ABNORMAL HIGH (ref 70–99)
HCT: 32 % — ABNORMAL LOW (ref 36.0–46.0)
HEMATOCRIT: 33 % — AB (ref 36.0–46.0)
HEMATOCRIT: 33 % — AB (ref 36.0–46.0)
HEMOGLOBIN: 11.2 g/dL — AB (ref 12.0–15.0)
Hemoglobin: 10.9 g/dL — ABNORMAL LOW (ref 12.0–15.0)
Hemoglobin: 11.2 g/dL — ABNORMAL LOW (ref 12.0–15.0)
POTASSIUM: 3.8 mmol/L (ref 3.5–5.1)
POTASSIUM: 4 mmol/L (ref 3.5–5.1)
Potassium: 3.8 mmol/L (ref 3.5–5.1)
SODIUM: 140 mmol/L (ref 135–145)
SODIUM: 141 mmol/L (ref 135–145)
Sodium: 143 mmol/L (ref 135–145)
TCO2: 24 mmol/L (ref 22–32)
TCO2: 25 mmol/L (ref 22–32)
TCO2: 25 mmol/L (ref 22–32)

## 2018-03-03 LAB — BLOOD GAS, ARTERIAL
Acid-Base Excess: 1.7 mmol/L (ref 0.0–2.0)
BICARBONATE: 25.1 mmol/L (ref 20.0–28.0)
O2 CONTENT: 2 L/min
O2 SAT: 99.4 %
PATIENT TEMPERATURE: 98.6
pCO2 arterial: 35.1 mmHg (ref 32.0–48.0)
pH, Arterial: 7.468 — ABNORMAL HIGH (ref 7.350–7.450)
pO2, Arterial: 167 mmHg — ABNORMAL HIGH (ref 83.0–108.0)

## 2018-03-03 SURGERY — IMPLANTATION, AORTIC VALVE, TRANSCATHETER, FEMORAL APPROACH
Anesthesia: Monitor Anesthesia Care | Site: Chest

## 2018-03-03 MED ORDER — TRAMADOL HCL 50 MG PO TABS: 50.0000 mg | ORAL_TABLET | ORAL | Status: DC | PRN

## 2018-03-03 MED ORDER — FENTANYL CITRATE (PF) 100 MCG/2ML IJ SOLN
INTRAMUSCULAR | Status: AC
  Administered 2018-03-03: 50 ug via INTRAVENOUS
  Filled 2018-03-03: qty 2

## 2018-03-03 MED ORDER — ONDANSETRON HCL 4 MG/2ML IJ SOLN: 4.0000 mg | Freq: Four times a day (QID) | INTRAMUSCULAR | Status: DC | PRN

## 2018-03-03 MED ORDER — SODIUM CHLORIDE 0.9 % IV SOLN
INTRAVENOUS | Status: AC
  Administered 2018-03-03: 14:00:00 via INTRAVENOUS

## 2018-03-03 MED ORDER — SODIUM CHLORIDE 0.9 % IV SOLN
INTRAVENOUS | Status: DC | PRN
  Administered 2018-03-03: 1500 mL

## 2018-03-03 MED ORDER — PROPOFOL 10 MG/ML IV BOLUS
INTRAVENOUS | Status: DC | PRN
  Administered 2018-03-03: 20 mg via INTRAVENOUS

## 2018-03-03 MED ORDER — SODIUM CHLORIDE 0.9 % IV SOLN
INTRAVENOUS | Status: AC
  Filled 2018-03-03 (×2): qty 1.2

## 2018-03-03 MED ORDER — SODIUM CHLORIDE 0.9 % IV SOLN
INTRAVENOUS | Status: AC
  Filled 2018-03-03: qty 1.2

## 2018-03-03 MED ORDER — IODIXANOL 320 MG/ML IV SOLN
INTRAVENOUS | Status: DC | PRN
  Administered 2018-03-03: 40 mL via INTRA_ARTERIAL

## 2018-03-03 MED ORDER — MIDAZOLAM HCL 2 MG/2ML IJ SOLN
INTRAMUSCULAR | Status: AC
  Filled 2018-03-03: qty 2

## 2018-03-03 MED ORDER — SODIUM CHLORIDE 0.9 % IV BOLUS: 500.0000 mL | Freq: Once | INTRAVENOUS | Status: DC

## 2018-03-03 MED ORDER — LACTATED RINGERS IV SOLN
INTRAVENOUS | Status: DC | PRN
  Administered 2018-03-03: 10:00:00 via INTRAVENOUS

## 2018-03-03 MED ORDER — CHLORHEXIDINE GLUCONATE 4 % EX LIQD: 30.0000 mL | CUTANEOUS | Status: DC

## 2018-03-03 MED ORDER — LEVOTHYROXINE SODIUM 50 MCG PO TABS
50.0000 ug | ORAL_TABLET | Freq: Every day | ORAL | Status: DC
  Administered 2018-03-04: 50 ug via ORAL
  Filled 2018-03-03: qty 1

## 2018-03-03 MED ORDER — OXYCODONE HCL 5 MG PO TABS: 5.0000 mg | ORAL_TABLET | ORAL | Status: DC | PRN

## 2018-03-03 MED ORDER — ACETAMINOPHEN 650 MG RE SUPP: 650.0000 mg | Freq: Four times a day (QID) | RECTAL | Status: DC | PRN

## 2018-03-03 MED ORDER — SODIUM CHLORIDE 0.9% FLUSH
3.0000 mL | Freq: Two times a day (BID) | INTRAVENOUS | Status: DC
  Administered 2018-03-03 – 2018-03-04 (×2): 3 mL via INTRAVENOUS

## 2018-03-03 MED ORDER — LORATADINE 10 MG PO TABS
10.0000 mg | ORAL_TABLET | Freq: Every day | ORAL | Status: DC
  Administered 2018-03-03 – 2018-03-04 (×2): 10 mg via ORAL
  Filled 2018-03-03 (×2): qty 1

## 2018-03-03 MED ORDER — LIDOCAINE HCL (PF) 1 % IJ SOLN
INTRAMUSCULAR | Status: AC
  Filled 2018-03-03: qty 30

## 2018-03-03 MED ORDER — PROTAMINE SULFATE 10 MG/ML IV SOLN
INTRAVENOUS | Status: DC | PRN
  Administered 2018-03-03: 100 mg via INTRAVENOUS
  Administered 2018-03-03: 10 mg via INTRAVENOUS

## 2018-03-03 MED ORDER — LACTATED RINGERS IV SOLN
INTRAVENOUS | Status: DC
  Administered 2018-03-03: 10:00:00 via INTRAVENOUS

## 2018-03-03 MED ORDER — 0.9 % SODIUM CHLORIDE (POUR BTL) OPTIME
TOPICAL | Status: DC | PRN
  Administered 2018-03-03: 1000 mL

## 2018-03-03 MED ORDER — ESCITALOPRAM OXALATE 10 MG PO TABS
10.0000 mg | ORAL_TABLET | Freq: Every day | ORAL | Status: DC
  Administered 2018-03-03: 10 mg via ORAL
  Filled 2018-03-03: qty 1

## 2018-03-03 MED ORDER — PANTOPRAZOLE SODIUM 40 MG PO TBEC
40.0000 mg | DELAYED_RELEASE_TABLET | Freq: Every day | ORAL | Status: DC
  Administered 2018-03-03 – 2018-03-04 (×2): 40 mg via ORAL
  Filled 2018-03-03 (×2): qty 1

## 2018-03-03 MED ORDER — VANCOMYCIN HCL IN DEXTROSE 1-5 GM/200ML-% IV SOLN
1000.0000 mg | Freq: Once | INTRAVENOUS | Status: AC
  Administered 2018-03-03: 1000 mg via INTRAVENOUS
  Filled 2018-03-03: qty 200

## 2018-03-03 MED ORDER — LIDOCAINE HCL 1 % IJ SOLN
INTRAMUSCULAR | Status: DC | PRN
  Administered 2018-03-03: 20 mL

## 2018-03-03 MED ORDER — ONDANSETRON HCL 4 MG/2ML IJ SOLN
INTRAMUSCULAR | Status: DC | PRN
  Administered 2018-03-03: 4 mg via INTRAVENOUS

## 2018-03-03 MED ORDER — SODIUM CHLORIDE 0.9 % IV SOLN: 250.0000 mL | INTRAVENOUS | Status: DC | PRN

## 2018-03-03 MED ORDER — LACTATED RINGERS IV SOLN
INTRAVENOUS | Status: DC | PRN
  Administered 2018-03-03: 11:00:00 via INTRAVENOUS

## 2018-03-03 MED ORDER — PHENYLEPHRINE HCL-NACL 20-0.9 MG/250ML-% IV SOLN
0.0000 ug/min | INTRAVENOUS | Status: DC
  Filled 2018-03-03: qty 250

## 2018-03-03 MED ORDER — ASPIRIN EC 81 MG PO TBEC
81.0000 mg | DELAYED_RELEASE_TABLET | Freq: Every day | ORAL | Status: DC
  Administered 2018-03-04: 81 mg via ORAL
  Filled 2018-03-03: qty 1

## 2018-03-03 MED ORDER — CHLORHEXIDINE GLUCONATE 0.12 % MT SOLN
15.0000 mL | Freq: Once | OROMUCOSAL | Status: AC
  Administered 2018-03-03: 15 mL via OROMUCOSAL
  Filled 2018-03-03: qty 15

## 2018-03-03 MED ORDER — ACETAMINOPHEN 325 MG PO TABS
650.0000 mg | ORAL_TABLET | Freq: Four times a day (QID) | ORAL | Status: DC | PRN
  Administered 2018-03-03 – 2018-03-04 (×2): 650 mg via ORAL
  Filled 2018-03-03 (×2): qty 2

## 2018-03-03 MED ORDER — SODIUM CHLORIDE 0.9% FLUSH: 3.0000 mL | INTRAVENOUS | Status: DC | PRN

## 2018-03-03 MED ORDER — DEXMEDETOMIDINE HCL IN NACL 200 MCG/50ML IV SOLN
INTRAVENOUS | Status: DC | PRN
  Administered 2018-03-03: 35 ug via INTRAVENOUS

## 2018-03-03 MED ORDER — MORPHINE SULFATE (PF) 2 MG/ML IV SOLN: 2.0000 mg | INTRAVENOUS | Status: DC | PRN

## 2018-03-03 MED ORDER — NITROGLYCERIN IN D5W 200-5 MCG/ML-% IV SOLN: 0.0000 ug/min | INTRAVENOUS | Status: DC

## 2018-03-03 MED ORDER — ROSUVASTATIN CALCIUM 10 MG PO TABS
40.0000 mg | ORAL_TABLET | Freq: Every day | ORAL | Status: DC
  Administered 2018-03-03: 40 mg via ORAL
  Filled 2018-03-03: qty 4

## 2018-03-03 MED ORDER — METOPROLOL TARTRATE 5 MG/5ML IV SOLN: 2.5000 mg | INTRAVENOUS | Status: DC | PRN

## 2018-03-03 MED ORDER — FENTANYL CITRATE (PF) 100 MCG/2ML IJ SOLN
50.0000 ug | Freq: Once | INTRAMUSCULAR | Status: AC
  Administered 2018-03-03: 50 ug via INTRAVENOUS

## 2018-03-03 MED ORDER — SODIUM CHLORIDE 0.9 % IV SOLN
1.5000 g | Freq: Two times a day (BID) | INTRAVENOUS | Status: DC
  Administered 2018-03-03 – 2018-03-04 (×2): 1.5 g via INTRAVENOUS
  Filled 2018-03-03 (×2): qty 1.5

## 2018-03-03 MED ORDER — CLOPIDOGREL BISULFATE 75 MG PO TABS
75.0000 mg | ORAL_TABLET | Freq: Every day | ORAL | Status: DC
  Administered 2018-03-04: 75 mg via ORAL
  Filled 2018-03-03: qty 1

## 2018-03-03 MED ORDER — SODIUM CHLORIDE 0.9 % IV SOLN: INTRAVENOUS | Status: DC

## 2018-03-03 MED ORDER — HEPARIN SODIUM (PORCINE) 1000 UNIT/ML IJ SOLN
INTRAMUSCULAR | Status: DC | PRN
  Administered 2018-03-03: 11000 [IU] via INTRAVENOUS

## 2018-03-03 SURGICAL SUPPLY — 87 items
BAG DECANTER FOR FLEXI CONT (MISCELLANEOUS) IMPLANT
BAG SNAP BAND KOVER 36X36 (MISCELLANEOUS) ×3 IMPLANT
BLADE CLIPPER SURG (BLADE) IMPLANT
BLADE OSCILLATING /SAGITTAL (BLADE) IMPLANT
BLADE STERNUM SYSTEM 6 (BLADE) IMPLANT
CABLE ADAPT CONN TEMP 6FT (ADAPTER) ×3 IMPLANT
CANNULA FEM VENOUS REMOTE 22FR (CANNULA) IMPLANT
CANNULA OPTISITE PERFUSION 16F (CANNULA) IMPLANT
CANNULA OPTISITE PERFUSION 18F (CANNULA) IMPLANT
CATH DIAG EXPO 6F VENT PIG 145 (CATHETERS) ×6 IMPLANT
CATH EXPO 5FR AL1 (CATHETERS) ×3 IMPLANT
CATH INFINITI 6F AL2 (CATHETERS) IMPLANT
CATH S G BIP PACING (SET/KITS/TRAYS/PACK) ×3 IMPLANT
CLIP VESOCCLUDE MED 24/CT (CLIP) IMPLANT
CLIP VESOCCLUDE SM WIDE 24/CT (CLIP) IMPLANT
CONT SPEC 4OZ CLIKSEAL STRL BL (MISCELLANEOUS) ×6 IMPLANT
COVER BACK TABLE 24X17X13 BIG (DRAPES) IMPLANT
COVER BACK TABLE 80X110 HD (DRAPES) ×3 IMPLANT
COVER DOME SNAP 22 D (MISCELLANEOUS) IMPLANT
COVER WAND RF STERILE (DRAPES) ×3 IMPLANT
CRADLE DONUT ADULT HEAD (MISCELLANEOUS) ×3 IMPLANT
DERMABOND ADVANCED (GAUZE/BANDAGES/DRESSINGS) ×1
DERMABOND ADVANCED .7 DNX12 (GAUZE/BANDAGES/DRESSINGS) ×2 IMPLANT
DEVICE CLOSURE PERCLS PRGLD 6F (VASCULAR PRODUCTS) ×4 IMPLANT
DRAPE INCISE IOBAN 66X45 STRL (DRAPES) IMPLANT
DRSG TEGADERM 4X4.75 (GAUZE/BANDAGES/DRESSINGS) ×6 IMPLANT
ELECT CAUTERY BLADE 6.4 (BLADE) IMPLANT
ELECT REM PT RETURN 9FT ADLT (ELECTROSURGICAL) ×6
ELECTRODE REM PT RTRN 9FT ADLT (ELECTROSURGICAL) ×4 IMPLANT
FELT TEFLON 6X6 (MISCELLANEOUS) IMPLANT
FEMORAL VENOUS CANN RAP (CANNULA) IMPLANT
GAUZE SPONGE 4X4 12PLY STRL (GAUZE/BANDAGES/DRESSINGS) ×3 IMPLANT
GAUZE SPONGE 4X4 12PLY STRL LF (GAUZE/BANDAGES/DRESSINGS) ×3 IMPLANT
GLOVE BIO SURGEON STRL SZ7.5 (GLOVE) ×3 IMPLANT
GLOVE BIO SURGEON STRL SZ8 (GLOVE) IMPLANT
GLOVE EUDERMIC 7 POWDERFREE (GLOVE) IMPLANT
GLOVE ORTHO TXT STRL SZ7.5 (GLOVE) ×3 IMPLANT
GOWN STRL REUS W/ TWL LRG LVL3 (GOWN DISPOSABLE) IMPLANT
GOWN STRL REUS W/ TWL XL LVL3 (GOWN DISPOSABLE) ×2 IMPLANT
GOWN STRL REUS W/TWL LRG LVL3 (GOWN DISPOSABLE)
GOWN STRL REUS W/TWL XL LVL3 (GOWN DISPOSABLE) ×1
GUIDEWIRE SAFE TJ AMPLATZ EXST (WIRE) ×3 IMPLANT
GUIDEWIRE STRAIGHT .035 260CM (WIRE) ×3 IMPLANT
INSERT FOGARTY SM (MISCELLANEOUS) IMPLANT
KIT BASIN OR (CUSTOM PROCEDURE TRAY) ×3 IMPLANT
KIT DILATOR VASC 18G NDL (KITS) IMPLANT
KIT HEART LEFT (KITS) ×3 IMPLANT
KIT SUCTION CATH 14FR (SUCTIONS) IMPLANT
KIT TURNOVER KIT B (KITS) ×3 IMPLANT
LOOP VESSEL MAXI BLUE (MISCELLANEOUS) IMPLANT
LOOP VESSEL MINI RED (MISCELLANEOUS) IMPLANT
NEEDLE 22X1 1/2 (OR ONLY) (NEEDLE) IMPLANT
NEEDLE PERC 18GX7CM (NEEDLE) ×3 IMPLANT
NS IRRIG 1000ML POUR BTL (IV SOLUTION) ×3 IMPLANT
PACK ENDOVASCULAR (PACKS) ×3 IMPLANT
PAD ARMBOARD 7.5X6 YLW CONV (MISCELLANEOUS) ×6 IMPLANT
PAD ELECT DEFIB RADIOL ZOLL (MISCELLANEOUS) ×3 IMPLANT
PENCIL BUTTON HOLSTER BLD 10FT (ELECTRODE) IMPLANT
PERCLOSE PROGLIDE 6F (VASCULAR PRODUCTS) ×6
SET MICROPUNCTURE 5F STIFF (MISCELLANEOUS) ×3 IMPLANT
SHEATH BRITE TIP 6FR 35CM (SHEATH) ×3 IMPLANT
SHEATH PINNACLE 6F 10CM (SHEATH) ×3 IMPLANT
SHEATH PINNACLE 8F 10CM (SHEATH) ×3 IMPLANT
SLEEVE REPOSITIONING LENGTH 30 (MISCELLANEOUS) ×3 IMPLANT
SPONGE LAP 4X18 RFD (DISPOSABLE) IMPLANT
STOPCOCK MORSE 400PSI 3WAY (MISCELLANEOUS) ×6 IMPLANT
SUT ETHIBOND X763 2 0 SH 1 (SUTURE) IMPLANT
SUT GORETEX CV 4 TH 22 36 (SUTURE) IMPLANT
SUT GORETEX CV4 TH-18 (SUTURE) IMPLANT
SUT MNCRL AB 3-0 PS2 18 (SUTURE) IMPLANT
SUT PROLENE 5 0 C 1 36 (SUTURE) IMPLANT
SUT PROLENE 6 0 C 1 30 (SUTURE) IMPLANT
SUT SILK  1 MH (SUTURE) ×1
SUT SILK 1 MH (SUTURE) ×2 IMPLANT
SUT VIC AB 2-0 CT1 27 (SUTURE)
SUT VIC AB 2-0 CT1 TAPERPNT 27 (SUTURE) IMPLANT
SUT VIC AB 2-0 CTX 36 (SUTURE) IMPLANT
SUT VIC AB 3-0 SH 8-18 (SUTURE) IMPLANT
SYR 50ML LL SCALE MARK (SYRINGE) ×3 IMPLANT
SYR BULB IRRIGATION 50ML (SYRINGE) IMPLANT
SYR CONTROL 10ML LL (SYRINGE) IMPLANT
TAPE CLOTH SURG 4X10 WHT LF (GAUZE/BANDAGES/DRESSINGS) ×3 IMPLANT
TOWEL GREEN STERILE (TOWEL DISPOSABLE) ×6 IMPLANT
TRANSDUCER W/STOPCOCK (MISCELLANEOUS) ×6 IMPLANT
TRAY FOLEY SLVR 16FR TEMP STAT (SET/KITS/TRAYS/PACK) IMPLANT
VALVE HEART TRANSCATH SZ3 23MM (Valve) ×3 IMPLANT
WIRE .035 3MM-J 145CM (WIRE) ×3 IMPLANT

## 2018-03-03 NOTE — Anesthesia Preprocedure Evaluation (Addendum)
Anesthesia Evaluation  Patient identified by MRN, date of birth, ID band Patient awake    Reviewed: Allergy & Precautions  Airway Mallampati: II  TM Distance: >3 FB     Dental   Pulmonary    breath sounds clear to auscultation       Cardiovascular hypertension, + angina + Valvular Problems/Murmurs  Rhythm:Regular Rate:Normal     Neuro/Psych    GI/Hepatic negative GI ROS, Neg liver ROS,   Endo/Other  Hypothyroidism   Renal/GU negative Renal ROS     Musculoskeletal   Abdominal   Peds  Hematology   Anesthesia Other Findings   Reproductive/Obstetrics                             Anesthesia Physical Anesthesia Plan  ASA: III  Anesthesia Plan: MAC   Post-op Pain Management:    Induction: Intravenous  PONV Risk Score and Plan: Midazolam  Airway Management Planned: Oral ETT  Additional Equipment: CVP, Arterial line and Ultrasound Guidance Line Placement  Intra-op Plan:   Post-operative Plan:   Informed Consent: I have reviewed the patients History and Physical, chart, labs and discussed the procedure including the risks, benefits and alternatives for the proposed anesthesia with the patient or authorized representative who has indicated his/her understanding and acceptance.   Dental advisory given  Plan Discussed with: CRNA, Anesthesiologist and Surgeon  Anesthesia Plan Comments:        Anesthesia Quick Evaluation

## 2018-03-03 NOTE — Transfer of Care (Signed)
Immediate Anesthesia Transfer of Care Note  Patient: Jenel Lucks  Procedure(s) Performed: TRANSCATHETER AORTIC VALVE REPLACEMENT, TRANSFEMORAL (N/A Chest) INTRAOPERATIVE TRANSTHORACIC ECHOCARDIOGRAM (N/A )  Patient Location: PACU  Anesthesia Type:MAC  Level of Consciousness: awake, alert  and oriented  Airway & Oxygen Therapy: Patient Spontanous Breathing and Patient connected to nasal cannula oxygen  Post-op Assessment: Report given to RN, Post -op Vital signs reviewed and stable and Patient moving all extremities  Post vital signs: Reviewed and stable  Last Vitals:  Vitals Value Taken Time  BP 92/43 03/03/2018 12:21 PM  Temp    Pulse 52 03/03/2018 12:21 PM  Resp 18 03/03/2018 12:21 PM  SpO2 94 % 03/03/2018 12:21 PM  Vitals shown include unvalidated device data.  Last Pain:  Vitals:   03/03/18 0858  TempSrc:   PainSc: 0-No pain      Patients Stated Pain Goal: 3 ([id number])  Complications: No apparent anesthesia complications

## 2018-03-03 NOTE — Anesthesia Procedure Notes (Signed)
Procedure Name: MAC Date/Time: 03/03/2018 10:12 AM Performed by: Belinda Block, MD Pre-anesthesia Checklist: Patient identified, Emergency Drugs available, Suction available, Patient being monitored and Timeout performed Oxygen Delivery Method: Simple face mask Placement Confirmation: positive ETCO2 Dental Injury: Teeth and Oropharynx as per pre-operative assessment

## 2018-03-03 NOTE — Progress Notes (Signed)
Patient arrived to 4E room 11 post TAVR with Dr. Angelena Form.  Telemetry monitor applied and CCMD notified.  CHG bath done.  Patient oriented to unit and room to include call light and phone.  Will continue to monitor.

## 2018-03-03 NOTE — Progress Notes (Signed)
  Echocardiogram 2D Echocardiogram has been performed.  Shannon Mills 03/03/2018, 12:16 PM

## 2018-03-03 NOTE — Progress Notes (Deleted)
Patient ambulated to the bathroom and back with very minimal assistance.  Patient tolerated ambulation well.  Will continue to monitor.

## 2018-03-03 NOTE — Anesthesia Procedure Notes (Signed)
Arterial Line Insertion Start/End11/03/2018 9:30 AM, 03/03/2018 10:00 AM Performed by: Verdie Drown, CRNA, CRNA  Patient location: Pre-op. Preanesthetic checklist: patient identified, IV checked, site marked, risks and benefits discussed, surgical consent, monitors and equipment checked, pre-op evaluation, timeout performed and anesthesia consent Lidocaine 1% used for infiltration and patient sedated Left, radial was placed Catheter size: 20 G Hand hygiene performed  and maximum sterile barriers used  Allen's test indicative of satisfactory collateral circulation Attempts: 5 or more (3 attempt to right arm, 1 attempt to left) Procedure performed without using ultrasound guided technique. Following insertion, Biopatch and dressing applied. Post procedure assessment: normal  Patient tolerated the procedure well with no immediate complications.

## 2018-03-03 NOTE — Interval H&P Note (Signed)
History and Physical Interval Note:  03/03/2018 9:34 AM  Jenel Lucks  has presented today for surgery, with the diagnosis of Severe Aortic Stenosis  The various methods of treatment have been discussed with the patient and family. After consideration of risks, benefits and other options for treatment, the patient has consented to  Procedure(s): TRANSCATHETER AORTIC VALVE REPLACEMENT, TRANSFEMORAL (N/A) TRANSESOPHAGEAL ECHOCARDIOGRAM (TEE) (N/A) as a surgical intervention .  The patient's history has been reviewed, patient examined, no change in status, stable for surgery.  I have reviewed the patient's chart and labs.  Questions were answered to the patient's satisfaction.     Lauree Chandler

## 2018-03-03 NOTE — Anesthesia Postprocedure Evaluation (Signed)
Anesthesia Post Note  Patient: Shannon Mills  Procedure(s) Performed: TRANSCATHETER AORTIC VALVE REPLACEMENT, TRANSFEMORAL (N/A Chest) INTRAOPERATIVE TRANSTHORACIC ECHOCARDIOGRAM (N/A )     Patient location during evaluation: PACU Anesthesia Type: MAC Level of consciousness: awake Pain management: pain level controlled Vital Signs Assessment: post-procedure vital signs reviewed and stable Respiratory status: spontaneous breathing Cardiovascular status: stable Postop Assessment: no apparent nausea or vomiting Anesthetic complications: no    Last Vitals:  Vitals:   03/03/18 1416 03/03/18 1629  BP: 121/69 (!) 100/52  Pulse:  (!) 59  Resp:  18  Temp:  36.4 C  SpO2:      Last Pain:  Vitals:   03/03/18 1629  TempSrc: Oral  PainSc:                  [name]

## 2018-03-03 NOTE — Progress Notes (Signed)
Site area: Left groin a 6 french arterial and venous sheaths were removed by [employer] [facility]  Site Prior to Removal:  Level 0  Pressure Applied For 20 MINUTES    Bedrest Beginning at 1250p  Manual:   Yes.    Patient Status During Pull:  stable  Post Pull Groin Site:  Level 0  Post Pull Instructions Given:  Yes.    Post Pull Pulses Present:  Yes.    Dressing Applied:  Yes.    Comments:

## 2018-03-03 NOTE — CV Procedure (Signed)
[facility]  TAVR OPERATIVE NOTE   Date of Procedure:  03/03/2018  Preoperative Diagnosis: Severe Aortic Stenosis   Postoperative Diagnosis: Same   Procedure:    Transcatheter Aortic Valve Replacement - Transfemoral Approach  Edwards Sapien 3 THV (size 23 mm, model # [id number], serial # [id number])   Co-Surgeons:  Lauree Chandler, MD and Valentina Gu. Roxy Manns, MD   Anesthesiologist:  Nyoka Cowden  Echocardiographer:  Croitoru  Pre-operative Echo Findings:  Severe aortic stenosis  Normal left ventricular systolic function  Post-operative Echo Findings:  No paravalvular leak  Normal left ventricular systolic function  BRIEF CLINICAL NOTE AND INDICATIONS FOR SURGERY  77 yo female with history of HTN, hyperlipidemia, hypothyroidism, GERD, anxiety and severe aortic stenosis who is here today for TAVR. She was recently seen by Dr. Geraldo Pitter and c/o chest tightness and dyspnea. Echo 02/03/18 with normal LV size and normal LV systolic function with SVXB=93-90%. There was grade 1 diastolic dysfunction. The aortic valve is thickened and calcified. Mean gradient 43 mmHg, peak gradient 74 mmHg, AVA0.8 cm2, DVI 0.28. Mild mitral regurgitation. Cardiac cath 02/03/18 with no evidence of CAD. Mean gradient of 33 mmHg by cath with AVA 0.8 cm2.   During the course of the patient's preoperative work up they have been evaluated comprehensively by a multidisciplinary team of specialists coordinated through the [facility] in the [facility].  They have been demonstrated to suffer from symptomatic severe aortic stenosis as noted above. The patient has been counseled extensively as to the relative risks and benefits of all options for the treatment of severe aortic stenosis including long term medical therapy, conventional surgery for aortic valve replacement, and transcatheter aortic valve replacement.  The patient has been independently  evaluated by Dr. Roxy Manns from [facility] and she is felt to be a good candidate for TAVR. Based upon review of all of the patient's preoperative diagnostic tests they are felt to be candidate for transcatheter aortic valve replacement using the transfemoral approach as an alternative to high risk conventional surgery.    Following the decision to proceed with transcatheter aortic valve replacement, a discussion has been held regarding what types of management strategies would be attempted intraoperatively in the event of life-threatening complications, including whether or not the patient would be considered a candidate for the use of cardiopulmonary bypass and/or conversion to open sternotomy for attempted surgical intervention.  The patient has been advised of a variety of complications that might develop peculiar to this approach including but not limited to risks of death, stroke, paravalvular leak, aortic dissection or other major vascular complications, aortic annulus rupture, device embolization, cardiac rupture or perforation, acute myocardial infarction, arrhythmia, heart block or bradycardia requiring permanent pacemaker placement, congestive heart failure, respiratory failure, renal failure, pneumonia, infection, other late complications related to structural valve deterioration or migration, or other complications that might ultimately cause a temporary or permanent loss of functional independence or other long term morbidity.  The patient provides full informed consent for the procedure as described and all questions were answered preoperatively.    DETAILS OF THE OPERATIVE PROCEDURE  PREPARATION:   The patient is brought to the operating room on the above mentioned date and central monitoring was established by the anesthesia team including placement of a radial arterial line. The patient is placed in the supine position on the operating table.  Intravenous antibiotics are administered. Conscious  sedation is used.   Baseline transesophageal echocardiogram was performed. The  patient's chest, abdomen, both groins, and both lower extremities are prepared and draped in a sterile manner. A time out procedure is performed.   PERIPHERAL ACCESS:   Using the modified Seldinger technique, femoral arterial and venous access were obtained with placement of 6 Fr sheaths on the left side.  A pigtail diagnostic catheter was passed through the femoral arterial sheath under fluoroscopic guidance into the aortic root.  A temporary transvenous pacemaker catheter was passed through the femoral venous sheath under fluoroscopic guidance into the right ventricle.  The pacemaker was tested to ensure stable lead placement and pacemaker capture. Aortic root angiography was performed in order to determine the optimal angiographic angle for valve deployment.  TRANSFEMORAL ACCESS:  A micropuncture kit was used to gain access to the right femoral artery. Position confirmed with angiography. Pre-closure with double ProGlide closure devices. The patient was heparinized systemically and ACT verified > 250 seconds.    A 14 Fr transfemoral E-sheath was introduced into the right femoral artery after progressively dilating over an Amplatz superstiff wire. An AL-1 catheter was used to direct a straight-tip exchange length wire across the native aortic valve into the left ventricle. This was exchanged out for a pigtail catheter and position was confirmed in the LV apex. Simultaneous LV and Ao pressures were recorded.  The pigtail catheter was then exchanged for an Amplatz Extra-stiff wire in the LV apex.   TRANSCATHETER HEART VALVE DEPLOYMENT:  An Edwards Sapien 3 THV (size 23 mm) was prepared and crimped per manufacturer's guidelines, and the proper orientation of the valve is confirmed on the [employer] delivery system. The valve was advanced through the introducer sheath using normal technique until in an appropriate  position in the abdominal aorta beyond the sheath tip. The balloon was then retracted and using the fine-tuning wheel was centered on the valve. The valve was then advanced across the aortic arch using appropriate flexion of the catheter. The valve was carefully positioned across the aortic valve annulus. The Commander catheter was retracted using normal technique. Once final position of the valve has been confirmed by angiographic assessment, the valve is deployed while temporarily holding ventilation and during rapid ventricular pacing to maintain systolic blood pressure < 50 mmHg and pulse pressure < 10 mmHg. The balloon inflation is held for >3 seconds after reaching full deployment volume. Once the balloon has fully deflated the balloon is retracted into the ascending aorta and valve function is assessed using TTE. There is felt to be no paravalvular leak and no central aortic insufficiency.  The patient's hemodynamic recovery following valve deployment is good.  The deployment balloon and guidewire are both removed. Echo demostrated acceptable post-procedural gradients, stable mitral valve function, and no AI.   PROCEDURE COMPLETION:  The sheath was then removed and closure devices were completed. Protamine was administered once femoral arterial repair was complete. The temporary pacemaker, pigtail catheters and femoral sheaths were removed with manual pressure used for hemostasis.   The patient tolerated the procedure well and is transported to the surgical intensive care in stable condition. There were no immediate intraoperative complications. All sponge instrument and needle counts are verified correct at completion of the operation.   No blood products were administered during the operation.  The patient received a total of 40 mL of intravenous contrast during the procedure.  Lauree Chandler MD 03/03/2018 12:01 PM

## 2018-03-03 NOTE — Progress Notes (Signed)
Patient ambulated in the hallway approximately 500 feet without difficulty.  Will continue to monitor.

## 2018-03-03 NOTE — Op Note (Signed)
[facility]   MULTIDISCIPLINARY HEART VALVE TEAM   TAVR OPERATIVE NOTE   Date of Procedure:  03/03/2018  Preoperative Diagnosis: Severe Aortic Stenosis   Postoperative Diagnosis: Same   Procedure:    Transcatheter Aortic Valve Replacement - Percutaneous Right Transfemoral Approach  Edwards Sapien 3 THV (size 23 mm, model # 9600TFX, serial # [id number])   Co-Surgeons:  Lauree Chandler, MD and Valentina Gu. Roxy Manns, MD   Anesthesiologist:  Belinda Block, MD  Echocardiographer:  Sanda Klein, MD  Pre-operative Echo Findings:  Severe aortic stenosis  Normal left ventricular systolic function  Post-operative Echo Findings:  No paravalvular leak  Normal left ventricular systolic function   BRIEF CLINICAL NOTE AND INDICATIONS FOR SURGERY  Patient is a 77 year old female with history of aortic stenosis, hypertension, hyperlipidemia, and hypothyroidism who has been referred for surgical consultation to discuss treatment options for management of severe symptomatic aortic stenosis.  Patient states that she was first noted to have a heart murmur on physical exam by her primary care physician several years ago.  Echocardiogram performed August 2018 reportedly revealed normal left ventricular systolic function with moderate aortic stenosis..  Over the past 4 to 6 months the patient has developed progressive fatigue with decreased energy, decreased exercise tolerance, and substernal chest tightness brought on with physical exertion and relieved by rest.  She has recently developed some exertional shortness of breath.  She was referred to Dr. Geraldo Pitter and transthoracic echocardiogram performed February 03, 2018 revealed severe aortic stenosis with preserved left ventricular systolic function.  Diagnostic cardiac catheterization confirmed the presence of severe aortic stenosis and was notable for the absence of significant coronary artery disease.  Patient was referred to  the multidisciplinary heart valve clinic and has been evaluated previously by Dr. Angelena Form.  CT angiography has been performed and the patient was referred for surgical consultation.  During the course of the patient's preoperative work up they have been evaluated comprehensively by a multidisciplinary team of specialists coordinated through the [facility] in the [facility].  They have been demonstrated to suffer from symptomatic severe aortic stenosis as noted above. The patient has been counseled extensively as to the relative risks and benefits of all options for the treatment of severe aortic stenosis including long term medical therapy, conventional surgery for aortic valve replacement, and transcatheter aortic valve replacement.  All questions have been answered, and the patient provides full informed consent for the operation as described.   DETAILS OF THE OPERATIVE PROCEDURE  PREPARATION:    The patient is brought to the operating room on the above mentioned date and central monitoring was established by the anesthesia team including placement of a central venous line and radial arterial line. The patient is placed in the supine position on the operating table.  Intravenous antibiotics are administered. The patient is monitored closely throughout the procedure under conscious sedation.  Baseline transthoracic echocardiogram was performed. The patient's chest, abdomen, both groins, and both lower extremities are prepared and draped in a sterile manner. A time out procedure is performed.   PERIPHERAL ACCESS:    Using the modified Seldinger technique, femoral arterial and venous access was obtained with placement of 6 Fr sheaths on the left side.  A pigtail diagnostic catheter was passed through the left arterial sheath under fluoroscopic guidance into the aortic root.  A temporary transvenous pacemaker catheter was passed through the left  femoral venous sheath under fluoroscopic guidance into the right ventricle.  The pacemaker was tested to ensure stable lead placement and pacemaker capture. Aortic root angiography was performed in order to determine the optimal angiographic angle for valve deployment.   TRANSFEMORAL ACCESS:   Percutaneous transfemoral access and sheath placement was performed by Dr. Angelena Form using ultrasound guidance.  The right common femoral artery was cannulated using a micropuncture needle and appropriate location was verified using hand injection angiogram.  A pair of Abbott Perclose percutaneous closure devices were placed and a 6 French sheath replaced into the femoral artery.  The patient was heparinized systemically and ACT verified > 250 seconds.    A 14 Fr transfemoral E-sheath was introduced into the right common femoral artery after progressively dilating over an Amplatz superstiff wire. An AL-1 catheter was used to direct a straight-tip exchange length wire across the native aortic valve into the left ventricle. This was exchanged out for a pigtail catheter and position was confirmed in the LV apex. Simultaneous LV and Ao pressures were recorded.  The pigtail catheter was exchanged for an Amplatz Extra-stiff wire in the LV apex.  Echocardiography was utilized to confirm appropriate wire position and no sign of entanglement in the mitral subvalvular apparatus.   TRANSCATHETER HEART VALVE DEPLOYMENT:   An Edwards Sapien 3 transcatheter heart valve (size 23 mm, model #9600TFX, serial #[id number]) was prepared and crimped per manufacturer's guidelines, and the proper orientation of the valve is confirmed on the [employer] delivery system. The valve was advanced through the introducer sheath using normal technique until in an appropriate position in the abdominal aorta beyond the sheath tip. The balloon was then retracted and using the fine-tuning wheel was centered on the valve. The valve was then advanced  across the aortic arch using appropriate flexion of the catheter. The valve was carefully positioned across the aortic valve annulus. The Commander catheter was retracted using normal technique. Once final position of the valve has been confirmed by angiographic assessment, the valve is deployed while temporarily holding ventilation and during rapid ventricular pacing to maintain systolic blood pressure < 50 mmHg and pulse pressure < 10 mmHg. The balloon inflation is held for >3 seconds after reaching full deployment volume. Once the balloon has fully deflated the balloon is retracted into the ascending aorta and valve function is assessed using echocardiography. There is felt to be no paravalvular leak and no central aortic insufficiency.  The patient's hemodynamic recovery following valve deployment is good.  The deployment balloon and guidewire are both removed.    PROCEDURE COMPLETION:   The sheath was removed and femoral artery closure performed by Dr Angelena Form.  Protamine was administered once femoral arterial repair was complete. The temporary pacemaker, pigtail catheters and femoral sheaths were removed with manual pressure used for hemostasis.   The patient tolerated the procedure well and is transported to the surgical intensive care in stable condition. There were no immediate intraoperative complications. All sponge instrument and needle counts are verified correct at completion of the operation.   No blood products were administered during the operation.  The patient received a total of 40 mL of intravenous contrast during the procedure.   Rexene Alberts, MD 03/03/2018 12:01 PM

## 2018-03-03 NOTE — Progress Notes (Addendum)
  [facility] VALVE TEAM  Patient doing well s/p TAVR. She is hemodynamically stable now but BP remains soft. Initially hypotensive with SBP in 70s but improving with fluid bolus. Will give another 500 ccs now. Groin sites stable. ECG with sinus brady and 1st deg AV block but no high grade block. Will transfer to 4E when stable.  Plan for early ambulation after bedrest completed and hopeful discharge over the next 24-48 hours.   Angelena Form PA-C  [facility]  Pager [phone]

## 2018-03-03 NOTE — Anesthesia Procedure Notes (Addendum)
Central Venous Catheter Insertion Performed by: Belinda Block, MD Start/End11/03/2018 9:25 AM, 03/03/2018 9:40 AM Patient location: Pre-op. Preanesthetic checklist: patient identified, IV checked, site marked, risks and benefits discussed, surgical consent, monitors and equipment checked, pre-op evaluation and timeout performed Position: Trendelenburg Lidocaine 1% used for infiltration and patient sedated Hand hygiene performed , maximum sterile barriers used  and Seldinger technique used Central line was placed.Double lumen Procedure performed using ultrasound guided technique. Ultrasound Notes:anatomy identified and image(s) printed for medical record Attempts: 1 Following insertion, line sutured, dressing applied and Biopatch. Post procedure assessment: blood return through all ports  Patient tolerated the procedure well with no immediate complications.

## 2018-03-03 NOTE — Progress Notes (Signed)
Patient ambulated to the bathroom and back with very minimal assistance.  Patient tolerated ambulation well.  Will continue to monitor.

## 2018-03-04 ENCOUNTER — Other Ambulatory Visit: Payer: Self-pay | Admitting: Physician Assistant

## 2018-03-04 ENCOUNTER — Encounter (HOSPITAL_COMMUNITY): Payer: Self-pay | Admitting: Cardiovascular Disease

## 2018-03-04 ENCOUNTER — Inpatient Hospital Stay (HOSPITAL_COMMUNITY): Payer: Medicare Other

## 2018-03-04 DIAGNOSIS — I350 Nonrheumatic aortic (valve) stenosis: Principal | ICD-10-CM

## 2018-03-04 DIAGNOSIS — Z952 Presence of prosthetic heart valve: Secondary | ICD-10-CM

## 2018-03-04 LAB — CBC
HCT: 35.4 % — ABNORMAL LOW (ref 36.0–46.0)
HEMOGLOBIN: 11.3 g/dL — AB (ref 12.0–15.0)
MCH: 29 pg (ref 26.0–34.0)
MCHC: 31.9 g/dL (ref 30.0–36.0)
MCV: 90.8 fL (ref 80.0–100.0)
NRBC: 0 % (ref 0.0–0.2)
PLATELETS: 112 10*3/uL — AB (ref 150–400)
RBC: 3.9 MIL/uL (ref 3.87–5.11)
RDW: 13.7 % (ref 11.5–15.5)
WBC: 4.8 10*3/uL (ref 4.0–10.5)

## 2018-03-04 LAB — BASIC METABOLIC PANEL
ANION GAP: 3 — AB (ref 5–15)
BUN: 12 mg/dL (ref 8–23)
CHLORIDE: 110 mmol/L (ref 98–111)
CO2: 26 mmol/L (ref 22–32)
Calcium: 8.6 mg/dL — ABNORMAL LOW (ref 8.9–10.3)
Creatinine, Ser: 0.91 mg/dL (ref 0.44–1.00)
GFR calc Af Amer: >60 mL/min (ref >60)
GFR, EST NON AFRICAN AMERICAN: 59 mL/min — AB (ref >60)
GLUCOSE: 99 mg/dL (ref 70–99)
POTASSIUM: 3.7 mmol/L (ref 3.5–5.1)
Sodium: 139 mmol/L (ref 135–145)

## 2018-03-04 LAB — ECHOCARDIOGRAM LIMITED
HEIGHTINCHES: 62 in
Weight: 2467.2 oz

## 2018-03-04 LAB — MAGNESIUM: MAGNESIUM: 2 mg/dL (ref 1.7–2.4)

## 2018-03-04 MED ORDER — CLOPIDOGREL BISULFATE 75 MG PO TABS: 75.0000 mg | ORAL_TABLET | Freq: Every day | ORAL | 1 refills | Status: DC

## 2018-03-04 MED ORDER — PANTOPRAZOLE SODIUM 40 MG PO TBEC: 40.0000 mg | DELAYED_RELEASE_TABLET | Freq: Every day | ORAL | 1 refills | Status: DC

## 2018-03-04 NOTE — Progress Notes (Signed)
  Echocardiogram 2D Echocardiogram has been performed.  Bobbye Charleston 03/04/2018, 9:42 AM

## 2018-03-04 NOTE — Addendum Note (Signed)
Addendum  created 03/04/18 1608 by Belinda Block, MD   Intraprocedure Blocks edited, Sign clinical note

## 2018-03-04 NOTE — Discharge Summary (Addendum)
[facility] VALVE TEAM  Discharge Summary    Patient ID: Shannon Mills MRN: [id number]; DOB: 12-09-1940  Admit date: 03/03/2018 Discharge date: 03/04/2018  Primary Care Provider: [name]., MD   Primary Cardiologist: Dr. Geraldo Pitter / Dr. Angelena Form & Dr. Roxy Manns (TAVR)  Discharge Diagnoses    Principal Problem:   S/P TAVR (transcatheter aortic valve replacement) Active Problems:   Essential hypertension   Severe aortic stenosis   Hypothyroidism   Hypertension   Hypercholesterolemia   Chronic reflux esophagitis   Allergies Allergies  Allergen Reactions  . Levaquin [Levofloxacin] Other (See Comments)    UNSPECIFIED REACTION  "Makes her sick"    Diagnostic Studies/Procedures   TAVR OPERATIVE NOTE  Date of Procedure: 03/03/2018  Preoperative Diagnosis: Severe Aortic Stenosis  Postoperative Diagnosis: Same  Procedure:  Transcatheter Aortic Valve Replacement - Percutaneous Right Transfemoral Approach Edwards Sapien 3 THV (size 23 mm, model # 9600TFX, serial # [id number])  Co-Surgeons: Lauree Chandler, MD and Valentina Gu. Roxy Manns, MD  Pre-operative Echo Findings:  Severe aortic stenosis  Normal left ventricular systolic function Post-operative Echo Findings:  No paravalvular leak  Normal left ventricular systolic function  _____________   Echo 03/04/18: formal read pending at the time of discharge  History of Present Illness     Shannon Mills is a 77 y.o. female with a history of HTN, HLD, hypothyroidism, GERD, anxiety and severe AS who presented to [facility] on 03[facility]03/18 for planned TAVR.  Patient states that she was first noted to have a heart murmur on physical exam by her primary care physician several years ago. Echocardiogram performed August 2018 reportedly revealed normal left ventricular systolic function with moderate aortic stenosis. Over the past 4 to 6 months the patient has developed progressive fatigue with  decreased energy, decreased exercise tolerance, DOE and substernal chest tightness brought on with physical exertion and relieved by rest. She was referred to Dr. Dorian Furnace transthoracic echocardiogram performed February 03, 2018 revealed severe aortic stenosis with preserved left ventricular systolic function. Diagnostic cardiac catheterization confirmed the presence of severe aortic stenosis and was notable for the absence of significant coronary artery disease.   The patient has been evaluated by the multidisciplinary valve team and felt to have severe, symptomatic aortic stenosis and to be a suitable candidate for TAVR, which was set up for 03/03/18.   Hospital Course     Consultants: none  Severe AS: s/p successful TAVR with a 23 mm Edwards Sapien 3 THV via the TF approach on 03/03/18. Post operative echo completed but pending formal read at the time of discharge. Groin sites are stable. ECG with NSR and no high grade heart block. Continue ASA and plavix. I will see her back in the office in 1 week.   HTN: BP was soft post operatively and now she is normotensive off antihypertensives. Will plan to hold her home Toprol XL 25mg  daily at discharge and add back in the office next week if BP becomes elevated.    HLD: continue statin  GERD: prilsoec has been changed to protonix due to potential interaction with plavix.   _____________  Discharge Vitals Blood pressure 121/62, pulse 68, temperature 98 F (36.7 C), temperature source Oral, resp. rate 17, height 5\' 2"  (1.575 m), weight 69.9 kg, SpO2 95 %.  Filed Weights   03/04/18 0434  Weight: 69.9 kg    Labs & Radiologic Studies    CBC Recent Labs    03/03/18 1230 03/04/18 0420  WBC  --  4.8  HGB 11.2* 11.3*  HCT 33.0* 35.4*  MCV  --  90.8  PLT  --  657*   Basic Metabolic Panel Recent Labs    03/03/18 1230 03/04/18 0420  NA 141 139  K 3.8 3.7  CL 105 110  CO2  --  26  GLUCOSE 117* 99  BUN 17 12  CREATININE 0.90  0.91  CALCIUM  --  8.6*  MG  --  2.0   Liver Function Tests No results for input(s): AST, ALT, ALKPHOS, BILITOT, PROT, ALBUMIN in the last 72 hours. No results for input(s): LIPASE, AMYLASE in the last 72 hours. Cardiac Enzymes No results for input(s): CKTOTAL, CKMB, CKMBINDEX, TROPONINI in the last 72 hours. BNP Invalid input(s): POCBNP D-Dimer No results for input(s): DDIMER in the last 72 hours. Hemoglobin A1C No results for input(s): HGBA1C in the last 72 hours. Fasting Lipid Panel No results for input(s): CHOL, HDL, LDLCALC, TRIG, CHOLHDL, LDLDIRECT in the last 72 hours. Thyroid Function Tests No results for input(s): TSH, T4TOTAL, T3FREE, THYROIDAB in the last 72 hours.  Invalid input(s): FREET3 _____________  Dg Chest 2 View  Result Date: 02/27/2018 CLINICAL DATA:  Fatigue.  History of aortic stenosis. EXAM: CHEST - 2 VIEW COMPARISON:  CT chest February 17, 2018 FINDINGS: Cardiomediastinal silhouette is normal. Mildly calcified aortic arch. No pleural effusions or focal consolidations. Trachea projects midline and there is no pneumothorax. Soft tissue planes and included osseous structures are non-suspicious. Faint calcifications LEFT neck are likely vascular. Surgical clips in the included right abdomen compatible with cholecystectomy. IMPRESSION: 1. No acute cardiopulmonary process. Aortic Atherosclerosis (ICD10-I70.0). Electronically Signed   By: Elon Alas M.D.   On: 02/27/2018 14:16   Ct Coronary Morph W/cta Cor W/score W/ca W/cm &/or Wo/cm  Addendum Date: 02/18/2018   ADDENDUM REPORT: 02/18/2018 09:19 EXAM: OVER-READ INTERPRETATION  CT CHEST The following report is an over-read performed by radiologist Dr. Samara Snide [facility] on 02/18/2018. This over-read does not include interpretation of cardiac or coronary anatomy or pathology. The coronary CTA interpretation by the cardiologist is attached. COMPARISON:  None. FINDINGS: Please see the separate  concurrent chest CT angiogram report for details. IMPRESSION: Please see the separate concurrent chest CT angiogram report for details. Electronically Signed   By: Ilona Sorrel M.D.   On: 02/18/2018 09:19   Result Date: 02/18/2018 CLINICAL DATA:  Aortic Stenosis EXAM: Cardiac TAVR CT TECHNIQUE: The patient was scanned on a Siemens Force 846 slice scanner. A 120 kV retrospective scan was triggered in the ascending thoracic aorta at 140 HU's. Gantry rotation speed was 250 msecs and collimation was .6 mm. No beta blockade or nitro were given. The 3D data set was reconstructed in 5% intervals of the R-R cycle. Systolic and diastolic phases were analyzed on a dedicated work station using MPR, MIP and VRT modes. The patient received 80 cc of contrast. FINDINGS: Aortic Valve: Tri leaflet some calcification thickened leaflet tips Aorta: Bovine arch with mild calcific aortic atherosclerosis Sino-tubular Junction: 25 mm Ascending Thoracic Aorta: 35 mm Aortic Arch: 27 mm Descending Thoracic Aorta: 23 mm Sinus of Valsalva Measurements: Non-coronary: 28.4 mm Right - coronary: 26.2 mm Left -   coronary: 27.9 mm Coronary Artery Height above Annulus: Left Main: 11.25 mm above annulus Right Coronary: 17.2 mm above annulus Virtual Basal Annulus Measurements: Maximum / Minimum Diameter: 23.5 mm x 17.7 mm Perimeter: 67 mm Area: 311-340 mm2 Coronary Arteries: Sufficient height above annulus for deployment  Optimum Fluoroscopic Angle for Delivery: LAO 25 degrees Cranial 4 degrees IMPRESSION: 1. Trileaflet AV with annular area at most 340 mm2 lower limit suitable for a 23 mm Sapien 3 valve or a 26 mm Evolut Pro valve 2.  Normal aortic root diameter 3.5 cm with bovine arch 3.  Coronary arteries sufficient height above annulus for deployment 4. Optimum angiographic angle for deployment LAO 25 degrees Cranial 4 degrees 5.  No LAA thrombus Jenkins Rouge Electronically Signed: By: Jenkins Rouge M.D. On: 02/17/2018 16:50   Dg Chest Port 1  View  Result Date: 03/03/2018 CLINICAL DATA:  Status post TAVR EXAM: PORTABLE CHEST 1 VIEW COMPARISON:  02/27/2018 FINDINGS: Cardiac shadows within normal limits. New TAVR is seen in place. Right jugular central line is noted. No pneumothorax is seen. The lungs are clear. No bony abnormality is noted. IMPRESSION: No acute abnormality noted following TAVR. Right jugular central line is noted without pneumothorax. Electronically Signed   By: Inez Catalina M.D.   On: 03/03/2018 14:59   Ct Angio Chest Aorta W &/or Wo Contrast  Result Date: 02/18/2018 CLINICAL DATA:  Severe symptomatic aortic stenosis. Pre-TAVR evaluation. EXAM: CT ANGIOGRAPHY CHEST, ABDOMEN AND PELVIS TECHNIQUE: Multidetector CT imaging through the chest, abdomen and pelvis was performed using the standard protocol during bolus administration of intravenous contrast. Multiplanar reconstructed images and MIPs were obtained and reviewed to evaluate the vascular anatomy. CONTRAST:  147mL ISOVUE-370 IOPAMIDOL (ISOVUE-370) INJECTION 76% COMPARISON:  None. FINDINGS: CTA CHEST FINDINGS Cardiovascular: Normal heart size. No significant pericardial effusion/thickening. Severe thickening and calcification of the aortic valve. Right coronary atherosclerosis. Atherosclerotic nonaneurysmal thoracic aorta. Normal caliber pulmonary arteries. No central pulmonary emboli. Mediastinum/Nodes: Hypodense subcentimeter bilateral thyroid lobe nodules. Unremarkable esophagus. No pathologically enlarged axillary, mediastinal or hilar lymph nodes. Lungs/Pleura: No pneumothorax. No pleural effusion. Peripheral left lower lobe 3 mm solid pulmonary nodule (series 5/image 65). No acute consolidative airspace disease, lung masses or additional significant pulmonary nodules. Musculoskeletal: No aggressive appearing focal osseous lesions. T8 vertebral hemangioma. Mild thoracic spondylosis. CTA ABDOMEN AND PELVIS FINDINGS Hepatobiliary: Normal liver with no liver mass.  Cholecystectomy. Bile ducts are within normal post cholecystectomy limits. Pancreas: Normal, with no mass or duct dilation. Spleen: Normal size. No mass. Adrenals/Urinary Tract: Normal adrenals. Normal kidneys with no hydronephrosis and no renal mass. Normal bladder. Stomach/Bowel: Normal non-distended stomach. Normal caliber small bowel with no small bowel wall thickening. Normal appendix. Normal large bowel with no diverticulosis, large bowel wall thickening or pericolonic fat stranding. Vascular/Lymphatic: Atherosclerotic nonaneurysmal abdominal aorta. Patent portal, splenic and renal veins. No pathologically enlarged lymph nodes in the abdomen or pelvis. Reproductive: Grossly normal uterus.  No adnexal mass. Other: No pneumoperitoneum, ascites or focal fluid collection. Musculoskeletal: No aggressive appearing focal osseous lesions. Moderate thoracic spondylosis. Small sclerotic lesion in the left superior acetabulum is nonspecific and most likely a benign bone island. VASCULAR MEASUREMENTS PERTINENT TO TAVR: AORTA: Minimal Aortic Diameter-12.9 x 11.7 mm (infrarenal abdominal aorta on series 4/image 121) Severity of Aortic Calcification-moderate RIGHT PELVIS: Right Common Iliac Artery - Minimal Diameter-7.9 x 7.0 mm Tortuosity-mild Calcification-moderate Right External Iliac Artery - Minimal Diameter-5.4 x 5.1 mm Tortuosity-moderate Calcification-none Right Common Femoral Artery - Minimal Diameter-5.7 x 5.1 mm Tortuosity-mild Calcification-mild LEFT PELVIS: Left Common Iliac Artery - Minimal Diameter-6.6 x 5.5 mm Tortuosity-mild Calcification-moderate Left External Iliac Artery - Minimal Diameter-5.6 x 5.6 mm Tortuosity-moderate Calcification-mild Left Common Femoral Artery - Minimal Diameter-6.2 x 5.7 mm Tortuosity-mild Calcification-mild Review of the MIP images confirms the above findings.  IMPRESSION: 1. Vascular findings and measurements pertinent to potential TAVR procedure, as detailed. 2. Severe  thickening and calcification of the aortic valve, compatible with the reported clinical history of severe aortic stenosis. 3. Aortic Atherosclerosis (ICD10-I70.0). 4. One vessel coronary atherosclerosis. 5. Solitary 3 mm solid pulmonary nodule. No follow-up needed if patient is low-risk. Non-contrast chest CT can be considered in 12 months if patient is high-risk. This recommendation follows the consensus statement: Guidelines for Management of Incidental Pulmonary Nodules Detected on CT Images:From the Fleischner Society 2017; published online before print (10.1148/radiol.[id number]). Electronically Signed   By: Ilona Sorrel M.D.   On: 02/18/2018 09:18   Vas US Carotid  Result Date: 02/17/2018 Carotid Arterial Duplex Study Indications: Severe Aortic stenosis / Pre TAVR Evaluation. Performing Technologist: Lorina Rabon  Examination Guidelines: A complete evaluation includes B-mode imaging, spectral Doppler, color Doppler, and power Doppler as needed of all accessible portions of each vessel. Bilateral testing is considered an integral part of a complete examination. Limited examinations for reoccurring indications may be performed as noted.  Right Carotid Findings: +----------+--------+--------+--------+-------------------------------+--------+           PSV cm/sEDV cm/sStenosisDescribe                       Comments +----------+--------+--------+--------+-------------------------------+--------+ CCA Prox  91      21                                                      +----------+--------+--------+--------+-------------------------------+--------+ CCA Distal49      15              hypoechoic and smooth                   +----------+--------+--------+--------+-------------------------------+--------+ ICA Prox  67      27      1-39%   hyperechoic, focal, calcific                                              and irregular                            +----------+--------+--------+--------+-------------------------------+--------+ ICA Distal109     47                                                      +----------+--------+--------+--------+-------------------------------+--------+ ECA       67      12                                                      +----------+--------+--------+--------+-------------------------------+--------+ +----------+--------+-------+--------+-------------------+           PSV cm/sEDV cmsDescribeArm Pressure (mmHG) +----------+--------+-------+--------+-------------------+ Subclavian40                                         +----------+--------+-------+--------+-------------------+ +---------+--------+--+--------+--+---------+  VertebralPSV cm/s38EDV cm/s14Antegrade +---------+--------+--+--------+--+---------+  Left Carotid Findings: +----------+--------+--------+--------+----------------------------+--------+           PSV cm/sEDV cm/sStenosisDescribe                    Comments +----------+--------+--------+--------+----------------------------+--------+ CCA Prox  86      26                                                   +----------+--------+--------+--------+----------------------------+--------+ CCA Distal67      24                                                   +----------+--------+--------+--------+----------------------------+--------+ ICA Prox  69      22      1-39%   focal, hypoechoic and smooth         +----------+--------+--------+--------+----------------------------+--------+ ICA Distal109     45                                                   +----------+--------+--------+--------+----------------------------+--------+ ECA       58      16                                                   +----------+--------+--------+--------+----------------------------+--------+ +----------+--------+--------+--------+-------------------+  SubclavianPSV cm/sEDV cm/sDescribeArm Pressure (mmHG) +----------+--------+--------+--------+-------------------+           73                                          +----------+--------+--------+--------+-------------------+ +---------+--------+--+--------+--+---------+ VertebralPSV cm/s40EDV cm/s14Antegrade +---------+--------+--+--------+--+---------+  Summary: Right Carotid: Velocities in the right ICA are consistent with a 1-39% stenosis. Left Carotid: Velocities in the left ICA are consistent with a 1-39% stenosis. Vertebrals: Bilateral vertebral arteries demonstrate antegrade flow. *See table(s) above for measurements and observations.  Electronically signed by Harold Barban MD on 02/17/2018 at 5:38:43 PM.    Final    Ct Angio Abd/pel W/ And/or W/o  Result Date: 02/18/2018 CLINICAL DATA:  Severe symptomatic aortic stenosis. Pre-TAVR evaluation. EXAM: CT ANGIOGRAPHY CHEST, ABDOMEN AND PELVIS TECHNIQUE: Multidetector CT imaging through the chest, abdomen and pelvis was performed using the standard protocol during bolus administration of intravenous contrast. Multiplanar reconstructed images and MIPs were obtained and reviewed to evaluate the vascular anatomy. CONTRAST:  174mL ISOVUE-370 IOPAMIDOL (ISOVUE-370) INJECTION 76% COMPARISON:  None. FINDINGS: CTA CHEST FINDINGS Cardiovascular: Normal heart size. No significant pericardial effusion/thickening. Severe thickening and calcification of the aortic valve. Right coronary atherosclerosis. Atherosclerotic nonaneurysmal thoracic aorta. Normal caliber pulmonary arteries. No central pulmonary emboli. Mediastinum/Nodes: Hypodense subcentimeter bilateral thyroid lobe nodules. Unremarkable esophagus. No pathologically enlarged axillary, mediastinal or hilar lymph nodes. Lungs/Pleura: No pneumothorax. No pleural effusion. Peripheral left lower lobe 3 mm solid pulmonary nodule (series 5/image 65). No acute consolidative airspace disease, lung  masses or additional significant pulmonary  nodules. Musculoskeletal: No aggressive appearing focal osseous lesions. T8 vertebral hemangioma. Mild thoracic spondylosis. CTA ABDOMEN AND PELVIS FINDINGS Hepatobiliary: Normal liver with no liver mass. Cholecystectomy. Bile ducts are within normal post cholecystectomy limits. Pancreas: Normal, with no mass or duct dilation. Spleen: Normal size. No mass. Adrenals/Urinary Tract: Normal adrenals. Normal kidneys with no hydronephrosis and no renal mass. Normal bladder. Stomach/Bowel: Normal non-distended stomach. Normal caliber small bowel with no small bowel wall thickening. Normal appendix. Normal large bowel with no diverticulosis, large bowel wall thickening or pericolonic fat stranding. Vascular/Lymphatic: Atherosclerotic nonaneurysmal abdominal aorta. Patent portal, splenic and renal veins. No pathologically enlarged lymph nodes in the abdomen or pelvis. Reproductive: Grossly normal uterus.  No adnexal mass. Other: No pneumoperitoneum, ascites or focal fluid collection. Musculoskeletal: No aggressive appearing focal osseous lesions. Moderate thoracic spondylosis. Small sclerotic lesion in the left superior acetabulum is nonspecific and most likely a benign bone island. VASCULAR MEASUREMENTS PERTINENT TO TAVR: AORTA: Minimal Aortic Diameter-12.9 x 11.7 mm (infrarenal abdominal aorta on series 4/image 121) Severity of Aortic Calcification-moderate RIGHT PELVIS: Right Common Iliac Artery - Minimal Diameter-7.9 x 7.0 mm Tortuosity-mild Calcification-moderate Right External Iliac Artery - Minimal Diameter-5.4 x 5.1 mm Tortuosity-moderate Calcification-none Right Common Femoral Artery - Minimal Diameter-5.7 x 5.1 mm Tortuosity-mild Calcification-mild LEFT PELVIS: Left Common Iliac Artery - Minimal Diameter-6.6 x 5.5 mm Tortuosity-mild Calcification-moderate Left External Iliac Artery - Minimal Diameter-5.6 x 5.6 mm Tortuosity-moderate Calcification-mild Left Common Femoral  Artery - Minimal Diameter-6.2 x 5.7 mm Tortuosity-mild Calcification-mild Review of the MIP images confirms the above findings. IMPRESSION: 1. Vascular findings and measurements pertinent to potential TAVR procedure, as detailed. 2. Severe thickening and calcification of the aortic valve, compatible with the reported clinical history of severe aortic stenosis. 3. Aortic Atherosclerosis (ICD10-I70.0). 4. One vessel coronary atherosclerosis. 5. Solitary 3 mm solid pulmonary nodule. No follow-up needed if patient is low-risk. Non-contrast chest CT can be considered in 12 months if patient is high-risk. This recommendation follows the consensus statement: Guidelines for Management of Incidental Pulmonary Nodules Detected on CT Images:From the Fleischner Society 2017; published online before print (10.1148/radiol.[id number]). Electronically Signed   By: Ilona Sorrel M.D.   On: 02/18/2018 09:18   Disposition   Pt is being discharged home today in good condition.  Follow-up Plans & Appointments    Follow-up Information    Eileen Stanford, PA-C. Go on 03/11/2018.   Specialties:  Cardiology, Radiology Contact information: [place]  [id number] [phone]            Discharge Medications   Allergies as of 03/04/2018      Reactions   Levaquin [levofloxacin] Other (See Comments)   UNSPECIFIED REACTION  "Makes her sick"      Medication List    STOP taking these medications   GOODY HEADACHE PO   metoprolol succinate 25 MG 24 hr tablet Commonly known as:  TOPROL-XL   omeprazole 40 MG capsule Commonly known as:  PRILOSEC Replaced by:  pantoprazole 40 MG tablet     TAKE these medications   aspirin EC 81 MG tablet Take 81 mg by mouth daily.   cetirizine 10 MG tablet Commonly known as:  ZYRTEC Take 10 mg by mouth at bedtime.   cholecalciferol 1000 units tablet Commonly known as:  VITAMIN D Take 1,000 Units by mouth daily.   clopidogrel 75 MG  tablet Commonly known as:  PLAVIX Take 1 tablet (75 mg total) by mouth daily with breakfast. Start taking on:  03/05/2018   escitalopram 10 MG tablet Commonly known as:  LEXAPRO Take 10 mg by mouth at bedtime.   levothyroxine 50 MCG tablet Commonly known as:  SYNTHROID, LEVOTHROID Take 50 mcg by mouth daily before breakfast.   pantoprazole 40 MG tablet Commonly known as:  PROTONIX Take 1 tablet (40 mg total) by mouth daily. Start taking on:  03/05/2018 Replaces:  omeprazole 40 MG capsule   rosuvastatin 40 MG tablet Commonly known as:  CRESTOR Take 40 mg by mouth at bedtime.           Outstanding Labs/Studies   none  Duration of Discharge Encounter   Greater than 30 minutes including physician time.  Signed, Angelena Form, PA-C 03/04/2018, 9:32 AM [phone]  I have personally seen and examined this patient. I agree with the assessment and plan as outlined above.  She is doing well this am post TAVR. She has no dyspnea. Bilateral groins without hematoma. She has ambulated. Echo with normally functioning bioprosthetic AVR with gradient between 15 and 20 mmHg. There is trivial AI.  Will discharge home today on ASA and Plavix.   Lauree Chandler 03/04/2018 10:29 AM

## 2018-03-04 NOTE — Progress Notes (Signed)
CARDIAC REHAB PHASE I   Pt states she has been ambulating independently in the room without any concerns. Currently on bedrest from IJ removal. Pt and husband educated on site care. Encouraged gradual activity increase. Reviewed restrictions and exercise guidelines. Pt declining CRP II at this time. Pt excited for d/c and excited to be feeling better.   [id number] Rufina Falco, RN BSN 03/04/2018 11:13 AM

## 2018-03-04 NOTE — Progress Notes (Signed)
Spoke with RN regarding order to remove central line. RN states she is able to remove.

## 2018-03-04 NOTE — Progress Notes (Signed)
Right 2-lumen IJ catheter removed per MD order without difficulty.  Patient instructed on bed rest.  Will continue to monitor.

## 2018-03-04 NOTE — Progress Notes (Signed)
Order received to discharge patient.  Telemetry monitor removed and CCMD notified.  PIV access removed.  Right 2L IJ catheter removed without difficulty.  Discharge instructions, follow up, medications and instructions for their use discussed with patient.

## 2018-03-04 NOTE — Care Management Note (Signed)
Case Management Note Marvetta Gibbons RN, BSN Transitions of Care Unit 4E- RN Case Manager [phone]  Patient Details  Name: RULA KENISTON MRN: [id number] Date of Birth: 1941/04/21  Subjective/Objective:  Pt admitted s/p TAVR                   Action/Plan: PTA pt lived at home with spouse, to return home with spouse, no CM needs noted for transition home.   Expected Discharge Date:  03/04/18               Expected Discharge Plan:  Home/Self Care  In-House Referral:  NA  Discharge planning Services  CM Consult  Post Acute Care Choice:  NA Choice offered to:  NA  DME Arranged:    DME Agency:     HH Arranged:    HH Agency:     Status of Service:  Completed, signed off  If discussed at [place] of Stay Meetings, dates discussed:    Discharge Disposition: home/self care   Additional Comments:  Dawayne Patricia, RN 03/04/2018, 10:52 AM

## 2018-03-04 NOTE — Discharge Instructions (Signed)
Your prilsoec has been changed to protonix due to a potential interaction with plavix, that would make the drug less effective. When you complete your 6 months of plavix you may go back on your prilosec.  ACTIVITY AND EXERCISE  Daily activity and exercise are an important part of your recovery. People recover at different rates depending on their general health and type of valve procedure.  Most people recovering from TAVR feel better relatively quickly   No lifting, pushing, pulling more than 10 pounds (examples to avoid: groceries, vacuuming, gardening, golfing):             - For one week with a procedure through the groin.             - For six weeks for procedures through the chest wall or neck NOTE: You will typically see one of our providers 7-14 days after your procedure to discuss [place] the above activities.      DRIVING  Do not drive for until you are seen for follow up and cleared by a provider. Generally, we ask patient to not drive for 1 week after their procedure.  If you have been told by your doctor in the past that you may not drive, you must talk with him/her before you begin driving again.     DRESSING  Groin site: you may leave the clear dressing over the site for up to one week or until it falls off.     HYGIENE  If you had a femoral (leg) procedure, you may take a shower when you return home. After the shower, pat the site dry. Do NOT use powder, oils or lotions in your groin area until the site has completely healed.  If you had a chest procedure, you may shower when you return home unless specifically instructed not to by your discharging practitioner.             - DO NOT scrub incision; pat dry with a towel             - DO NOT apply any lotions, oils, powders to the incision             - No tub baths / swimming for at least 2 weeks.  If you notice any fevers, chills, increased pain, swelling, bleeding or pus, please contact your doctor.     ADDITIONAL INFORMATION  If you are going to have an upcoming dental procedure, please contact our office as you will require antibiotics ahead of time to prevent infection on your heart valve.    If you have any questions or concerns you can call the structural heart phone during normal business hours 8am-4pm. If you have an urgent need after hours or weekends please call [phone] to talk to the on call provider for general cardiology. If you have an emergency that requires immediate attention, please call 911.    After TAVR Checklist  Check  Test Description   Follow up appointment in 1-2 weeks  Most of our patients will see our structural heart physician assistant, Nell Range. Your incision sites will be checked and you will be cleared to drive and resume all normal activities if you are doing well.     1 month echo and follow up  You will have an echo to check on your new heart valve and be seen back in the office by Nell Range. Many times the echo is not read by your appointment time, but  Joellen Jersey will call you later that day or the following day to report your results.   Follow up with your primary cardiologist You will need to be seen by your primary cardiologist in the following 3-6 months after your 1 month appointment in the valve clinic. Often times your Plavix or Aspirin will be discontinued during this time, but this is decided on a case by case basis.    1 year echo and follow up You will have another echo to check on your heart valve after 1 year and be seen back in the office by Nell Range. This your last structural heart visit.   Bacterial endocarditis prophylaxis  You will have to take antibiotics for the rest of your life before all dental procedures (even teeth cleanings) to protect your heart valve. Antibiotics are also required before some surgeries. Please check with your cardiologist before scheduling any surgeries. Also, please make sure to tell us if you have a  penicillin allergy as you will require an alternative antibiotic.

## 2018-03-05 ENCOUNTER — Telehealth: Payer: Self-pay | Admitting: Physician Assistant

## 2018-03-05 NOTE — Telephone Encounter (Signed)
  [facility] VALVE TEAM   Patient contacted regarding discharge from [facility] on 11/13  Patient understands to follow up with provider Nell Range on 1/21 @ 1:30pm at [place].  Patient understands discharge instructions? yes Patient understands medications and regiment? yes Patient understands to bring all medications to this visit? yes  Angelena Form PA-C  [facility]

## 2018-03-09 NOTE — Progress Notes (Signed)
[facility]                                       Cardiology Office Note    Date:  03/12/2018   ID:  Shannon Mills, DOB 05-17-1940, MRN [id number]  PCP:  Shannon Mills., MD  Cardiologist: Dr. Geraldo Mills / Dr. Angelena Mills & Dr. Roxy Mills (TAVR)  CC: [facility] s/p TAVR  History of Present Illness:  Shannon Mills is a 77 y.o. 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   Patient states that she was first noted to have a heart murmur on physical exam by her primary care physician several years ago. Echocardiogram performed August 2018 reportedly revealed normal left ventricular systolic function with moderate aortic stenosis. Over the past 4 to 6 months the patient has developed progressive fatigue with decreased energy, decreased exercise tolerance, DOE and substernal chest tightness brought on with physical exertion and relieved by rest. She was referred to Dr. Dorian Mills transthoracic echocardiogram performed February 03, 2018 revealed severe aortic stenosis with preserved left ventricular systolic function. Diagnostic cardiac catheterization confirmed the presence of severe aortic stenosis and was notable for the absence of significant coronary artery disease.   The patient was evaluated by the multidisciplinary valve team and underwent successful TAVR with a 23 mm Edwards Sapien 3 THV via the TF approach on 03/03/18. Post operative echo showed EF 65-70%, normally functioning TAVR with mean gradient 16 mm Hg and small PVL (oriented towards the pulmonary artery). BP was soft and Toprol XL 63m daily was held at discharge.   Today she presents to clinic for follow up. No CP or SOB. No LE edema, orthopnea or PND. No dizziness or syncope. No blood in stool or urine. No palpitations. She has noticed a slight improvement in energy levels. She has had some  soreness in her thighs.    Past Medical History:  Diagnosis Date  . Anxiety   . Chronic reflux esophagitis   . Hypercholesterolemia   . Hypertension   . Hypothyroidism   . Osteoporosis   . S/P TAVR (transcatheter aortic valve replacement)    23 mm Edwards Sapien 3 transcatheter heart valve placed via percutaneous right transfemoral approach   . Severe aortic stenosis   . Vitamin D deficiency     Past Surgical History:  Procedure Laterality Date  . CHOLECYSTECTOMY    . FOOT SURGERY    . INTRAOPERATIVE TRANSTHORACIC ECHOCARDIOGRAM N/A 03/03/2018   Procedure: INTRAOPERATIVE TRANSTHORACIC ECHOCARDIOGRAM;  Surgeon: Shannon Blanks MD;  [place] [facility]  Service: Open Heart Surgery;  Laterality: N/A;  . RIGHT/LEFT HEART CATH AND CORONARY ANGIOGRAPHY N/A 02/03/2018   Procedure: RIGHT/LEFT HEART CATH AND CORONARY ANGIOGRAPHY;  Surgeon: Shannon Peter M, MD;  [facility];  Service: Cardiovascular;  Laterality: N/A;  . TRANSCATHETER AORTIC VALVE REPLACEMENT, TRANSFEMORAL  03/03/2018  . TRANSCATHETER AORTIC VALVE REPLACEMENT, TRANSFEMORAL N/A 03/03/2018   Procedure: TRANSCATHETER AORTIC VALVE REPLACEMENT, TRANSFEMORAL;  Surgeon: Shannon Blanks MD;  [facility] [facility]  Service: Open Heart Surgery;  Laterality: N/A;  . TUBAL LIGATION      Current Medications: Outpatient Medications Prior to Visit  Medication Sig Dispense Refill  . aspirin EC 81 MG tablet Take 81 mg by mouth daily.    .Marland Kitchen  cetirizine (ZYRTEC) 10 MG tablet Take 10 mg by mouth at bedtime.    . cholecalciferol (VITAMIN D) 1000 units tablet Take 1,000 Units by mouth daily.    . clopidogrel (PLAVIX) 75 MG tablet Take 1 tablet (75 mg total) by mouth daily with breakfast. 90 tablet 1  . escitalopram (LEXAPRO) 10 MG tablet Take 10 mg by mouth at bedtime.     Marland Kitchen levothyroxine (SYNTHROID, LEVOTHROID) 50 MCG tablet Take 50 mcg by mouth daily before breakfast.    . pantoprazole (PROTONIX) 40 MG tablet Take  1 tablet (40 mg total) by mouth daily. 90 tablet 1  . rosuvastatin (CRESTOR) 40 MG tablet Take 40 mg by mouth at bedtime.      No facility-administered medications prior to visit.      Allergies:   Levaquin [levofloxacin]   Social History   Socioeconomic History  . Marital status: Married    Spouse name: Not on file  . Number of children: 2  . Years of education: Not on file  . Highest education level: Not on file  Occupational History  . Occupation: Retired-Office Research scientist (medical)  . Financial resource strain: Not on file  . Food insecurity:    Worry: Not on file    Inability: Not on file  . Transportation needs:    Medical: Not on file    Non-medical: Not on file  Tobacco Use  . Smoking status: Never Smoker  . Smokeless tobacco: Never Used  Substance and Sexual Activity  . Alcohol use: No  . Drug use: No  . Sexual activity: Not on file  Lifestyle  . Physical activity:    Days per week: Not on file    Minutes per session: Not on file  . Stress: Not on file  Relationships  . Social connections:    Talks on phone: Not on file    Gets together: Not on file    Attends religious service: Not on file    Active member of club or organization: Not on file    Attends meetings of clubs or organizations: Not on file    Relationship status: Not on file  Other Topics Concern  . Not on file  Social History Narrative  . Not on file     Family History:  The patient's family history includes COPD in her father; Dementia in her mother; Throat cancer in her brother.     ROS:   Please see the history of present illness.    ROS All other systems reviewed and are negative.   PHYSICAL EXAM:   VS:  BP (!) 146/84   Pulse 80   Ht _0  (1.575 Mills)   Wt 154 lb (69.9 kg)   SpO2 98%   BMI 28.17 kg/Mills   GEN: Well nourished, well developed, in no acute distress HEENT: normal Neck: no JVD or masses Cardiac: RRR; no murmurs, rubs, or gallops,no edema  Respiratory:  clear to  auscultation bilaterally, normal work of breathing GI: soft, nontender, nondistended, + BS MS: no deformity or atrophy Skin: warm and dry, no rash Neuro:  Alert and Oriented x 3, Strength and sensation are intact Psych: euthymic mood, full affect     Wt Readings from Last 3 Encounters:  03/12/18 154 lb (69.9 kg)  03/04/18 154 lb 3.2 oz (69.9 kg)  02/27/18 154 lb 6.4 oz (70 kg)     Studies/Labs Reviewed:   EKG:  EKG is ordered today.  The ekg ordered today demonstrates  sinus with 1st deg AV block, HR 80  Recent Labs: 02/27/2018: ALT 42; B Natriuretic Peptide 75.7 03/04/2018: BUN 12; Creatinine, Ser 0.91; Hemoglobin 11.3; Magnesium 2.0; Platelets 112; Potassium 3.7; Sodium 139   Lipid Panel No results found for: CHOL, TRIG, HDL, CHOLHDL, VLDL, LDLCALC, LDLDIRECT  Additional studies/ records that were reviewed today include:  TAVR OPERATIVE NOTE  Date of Procedure: 03/03/2018  Preoperative Diagnosis: Severe Aortic Stenosis  Postoperative Diagnosis: Same  Procedure:   Transcatheter Aortic Valve Replacement - Percutaneous Right Transfemoral Approach Edwards Sapien 3 THV (size 23 mm, model # 9600TFX, serial # [id number])  Co-Surgeons: Lauree Chandler, MD and Valentina Gu. Shannon Manns, MD  Pre-operative Echo Findings:   Severe aortic stenosis   Normal left ventricular systolic function Post-operative Echo Findings:   No paravalvular leak   Normal left ventricular systolic function  _____________   Echo 03/04/18 Study Conclusions - Left ventricle: The cavity size was normal. Wall thickness was   increased in a pattern of mild LVH. Systolic function was   vigorous. The estimated ejection fraction was in the range of 65%   to 70%. Wall motion was normal; there were no regional wall   motion abnormalities. Doppler parameters are consistent with   abnormal left ventricular relaxation (grade 1 diastolic   dysfunction). - Aortic valve: s/p Edwards Sapien 3 THV. No obstruction.  Small   perivalvular leak (oriented toward the pulmonary artery). Mean   gradient (S): 16 mm Hg. Peak gradient (S): 38 mm Hg. Valve area   (VTI): 1.23 cm^2. Valve area (Vmax): 1 cm^2. Valve area (Vmean):   1.27 cm^2. - Mitral valve: Mildly thickened leaflets . Moderate posterior MAC.   Trivial regurgitation. Valve area by pressure half-time: 1.88   cm^2. - Left atrium: The atrium was normal in size. - Inferior vena cava: The vessel was normal in size. The   respirophasic diameter changes were in the normal range (>= 50%),   consistent with normal central venous pressure. Impressions: - Compared to the echo yesterday, the LVEF is higher at 65-70%.   There is a small perivalvular leak. The TAVR valve gradients are   higher at 16 mmHg and 38 mmHg mean, respectively.   ASSESSMENT & PLAN:   Severe AS s/p TAVR: doing great. Groin sites healing well. ECG with sinus and 1st degree AV block but no HAVB. SBE prophylaxis discussed; I have RX'd amoxicillin. I will see her back in 1 month for follow up and echo   HTN: BP mildly elevated today. Resume home Toprol 76m daily.   HLD: continue statin   GERD: continue protonix   Medication Adjustments/Labs and Tests Ordered: Current medicines are reviewed at length with the patient today.  Concerns regarding medicines are outlined above.  Medication changes, Labs and Tests ordered today are listed in the Patient Instructions below. Patient Instructions  Medication Instructions:  Your physician has recommended you make the following change in your medication:   RESTART Toprol 25 mg once daily Take Amoxicillin 2000 mg 1 hour prior to dental work  If you need a refill on your cardiac medications before your next appointment, please call your pharmacy.    Lab work: None Ordered    Testing/Procedures: Keep your appointment for your echocardiogram on Dec. 11 at 3:00 pm    Follow-Up: You are cleared to resume normal activities including  driving  Your PA recommends that you keep your follow-up appointment with KNell Range [place]on Dec. 11     Signed, KAngelena Form  PA-C  03/12/2018 2:03 PM    [address], [place], [place]  [address] Phone: [phone]; Fax: [phone]

## 2018-03-10 MED FILL — Heparin Sodium (Porcine) Inj 1000 Unit/ML: INTRAMUSCULAR | Qty: 30 | Status: AC

## 2018-03-10 MED FILL — Magnesium Sulfate Inj 50%: INTRAMUSCULAR | Qty: 10 | Status: AC

## 2018-03-10 MED FILL — Potassium Chloride Inj 2 mEq/ML: INTRAVENOUS | Qty: 40 | Status: AC

## 2018-03-11 ENCOUNTER — Ambulatory Visit: Payer: Medicare Other | Admitting: Physician Assistant

## 2018-03-12 ENCOUNTER — Ambulatory Visit (INDEPENDENT_AMBULATORY_CARE_PROVIDER_SITE_OTHER): Payer: Medicare Other | Admitting: Physician Assistant

## 2018-03-12 ENCOUNTER — Encounter: Payer: Self-pay | Admitting: Physician Assistant

## 2018-03-12 VITALS — BP 146/84 | HR 80 | Ht 62.0 in | Wt 154.0 lb

## 2018-03-12 DIAGNOSIS — Z952 Presence of prosthetic heart valve: Secondary | ICD-10-CM | POA: Diagnosis not present

## 2018-03-12 DIAGNOSIS — I10 Essential (primary) hypertension: Secondary | ICD-10-CM | POA: Diagnosis not present

## 2018-03-12 DIAGNOSIS — K219 Gastro-esophageal reflux disease without esophagitis: Secondary | ICD-10-CM | POA: Diagnosis not present

## 2018-03-12 DIAGNOSIS — E782 Mixed hyperlipidemia: Secondary | ICD-10-CM | POA: Diagnosis not present

## 2018-03-12 MED ORDER — AMOXICILLIN 500 MG PO TABS: ORAL_TABLET | ORAL | 3 refills | Status: DC

## 2018-03-12 MED ORDER — METOPROLOL SUCCINATE ER 25 MG PO TB24: 25.0000 mg | ORAL_TABLET | Freq: Every day | ORAL | 3 refills | Status: AC

## 2018-03-12 NOTE — Patient Instructions (Addendum)
Medication Instructions:  Your physician has recommended you make the following change in your medication:   RESTART Toprol 25 mg once daily Take Amoxicillin 2000 mg 1 hour prior to dental work  If you need a refill on your cardiac medications before your next appointment, please call your pharmacy.    Lab work: None Ordered    Testing/Procedures: Keep your appointment for your echocardiogram on Dec. 11 at 3:00 pm    Follow-Up: You are cleared to resume normal activities including driving  Your PA recommends that you keep your follow-up appointment with Nell Range, [place] on Dec. 11

## 2018-03-25 ENCOUNTER — Encounter: Payer: Self-pay | Admitting: Thoracic Surgery (Cardiothoracic Vascular Surgery)

## 2018-03-27 ENCOUNTER — Telehealth: Payer: Self-pay | Admitting: Physician Assistant

## 2018-03-27 NOTE — Telephone Encounter (Signed)
New Message   Pt is wanting to know if her appt on 12/11 at 4pm can be moved to an earlier time due to her not wanting to drive in the dark. Please call

## 2018-03-27 NOTE — Telephone Encounter (Signed)
I spoke with the pt and made her aware that I can schedule her to see Katie at 2:30 PM on 04/01/18 with an Echo to follow.

## 2018-03-31 NOTE — Progress Notes (Signed)
[facility]                                       Cardiology Office Note    Date:  04/01/2018   ID:  Shannon Mills, DOB October 07, 1940, MRN [id number]  PCP:  [name]., MD  Cardiologist: Dr. Geraldo Pitter / Dr. Angelena Form & Dr. Roxy Manns (TAVR)  CC: 1 month s/p TAVR  History of Present Illness:  Shannon Mills is a 77 y.o. 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   Patient states that she was first noted to have a heart murmur on physical exam by her primary care physician several years ago. Echocardiogram performed August 2018 reportedly revealed normal left ventricular systolic function with moderate aortic stenosis. Over the past 4 to 6 months the patient has developed progressive fatigue with decreased energy, decreased exercise tolerance, DOE and substernal chest tightness brought on with physical exertion and relieved by rest. She was referred to Dr. Dorian Furnace transthoracic echocardiogram performed February 03, 2018 revealed severe aortic stenosis with preserved left ventricular systolic function. Diagnostic cardiac catheterization confirmed the presence of severe aortic stenosis and was notable for the absence of significant coronary artery disease.   The patient was evaluated by the multidisciplinary valve team and underwent successful TAVR with a 23 mm Edwards Sapien 3 THV via the TF approach on 03/03/18. Post operative echo showed EF 65-70%, normally functioning TAVR with mean gradient 16 mm Hg and small PVL (oriented towards the pulmonary artery). BP was soft and Toprol XL 30m daily was held at discharge.   At follow up she was doing well. BP was mildly elevated and Toprol XL was resumed.   Today she presents to clinic for follow up. No CP or SOB. No LE edema, orthopnea or PND. No dizziness or syncope. No blood in stool or urine. No  palpitations. She stays very active and walks to the mail box and paper box with no issues at all. She is very pleased with how well she is doing and thankful to the team.    Past Medical History:  Diagnosis Date  . Anxiety   . Chronic reflux esophagitis   . Hypercholesterolemia   . Hypertension   . Hypothyroidism   . Osteoporosis   . S/P TAVR (transcatheter aortic valve replacement)    23 mm Edwards Sapien 3 transcatheter heart valve placed via percutaneous right transfemoral approach   . Severe aortic stenosis   . Vitamin D deficiency     Past Surgical History:  Procedure Laterality Date  . CHOLECYSTECTOMY    . FOOT SURGERY    . INTRAOPERATIVE TRANSTHORACIC ECHOCARDIOGRAM N/A 03/03/2018   Procedure: INTRAOPERATIVE TRANSTHORACIC ECHOCARDIOGRAM;  Surgeon: MBurnell Blanks MD;  [place] [facility]  Service: Open Heart Surgery;  Laterality: N/A;  . RIGHT/LEFT HEART CATH AND CORONARY ANGIOGRAPHY N/A 02/03/2018   Procedure: RIGHT/LEFT HEART CATH AND CORONARY ANGIOGRAPHY;  Surgeon: JMartinique Peter M, MD;  [facility];  Service: Cardiovascular;  Laterality: N/A;  . TRANSCATHETER AORTIC VALVE REPLACEMENT, TRANSFEMORAL  03/03/2018  . TRANSCATHETER AORTIC VALVE REPLACEMENT, TRANSFEMORAL N/A 03/03/2018   Procedure: TRANSCATHETER AORTIC VALVE REPLACEMENT, TRANSFEMORAL;  Surgeon: MBurnell Blanks MD;  [place] [facility]  Service: Open Heart Surgery;  Laterality: N/A;  .  TUBAL LIGATION      Current Medications: Outpatient Medications Prior to Visit  Medication Sig Dispense Refill  . amoxicillin (AMOXIL) 500 MG tablet Take 2000 mg (4 tabs) one hour before dental work 4 tablet 3  . aspirin EC 81 MG tablet Take 81 mg by mouth daily.    . cetirizine (ZYRTEC) 10 MG tablet Take 10 mg by mouth at bedtime.    . cholecalciferol (VITAMIN D) 1000 units tablet Take 1,000 Units by mouth daily.    . clopidogrel (PLAVIX) 75 MG tablet Take 1 tablet (75 mg total) by mouth daily with  breakfast. 90 tablet 1  . escitalopram (LEXAPRO) 10 MG tablet Take 10 mg by mouth at bedtime.     Marland Kitchen levothyroxine (SYNTHROID, LEVOTHROID) 50 MCG tablet Take 50 mcg by mouth daily before breakfast.    . metoprolol succinate (TOPROL XL) 25 MG 24 hr tablet Take 1 tablet (25 mg total) by mouth daily. 90 tablet 3  . pantoprazole (PROTONIX) 40 MG tablet Take 1 tablet (40 mg total) by mouth daily. 90 tablet 1  . rosuvastatin (CRESTOR) 40 MG tablet Take 40 mg by mouth at bedtime.      No facility-administered medications prior to visit.      Allergies:   Levaquin [levofloxacin]   Social History   Socioeconomic History  . Marital status: Married    Spouse name: Not on file  . Number of children: 2  . Years of education: Not on file  . Highest education level: Not on file  Occupational History  . Occupation: Retired-Office Research scientist (medical)  . Financial resource strain: Not on file  . Food insecurity:    Worry: Not on file    Inability: Not on file  . Transportation needs:    Medical: Not on file    Non-medical: Not on file  Tobacco Use  . Smoking status: Never Smoker  . Smokeless tobacco: Never Used  Substance and Sexual Activity  . Alcohol use: No  . Drug use: No  . Sexual activity: Not on file  Lifestyle  . Physical activity:    Days per week: Not on file    Minutes per session: Not on file  . Stress: Not on file  Relationships  . Social connections:    Talks on phone: Not on file    Gets together: Not on file    Attends religious service: Not on file    Active member of club or organization: Not on file    Attends meetings of clubs or organizations: Not on file    Relationship status: Not on file  Other Topics Concern  . Not on file  Social History Narrative  . Not on file     Family History:  The patient's family history includes COPD in her father; Dementia in her mother; Throat cancer in her brother.      ROS:   Please see the history of present illness.     ROS All other systems reviewed and are negative.   PHYSICAL EXAM:   VS:  BP 112/64   Pulse 66   Ht 5' 2"  (1.575 m)   Wt 156 lb (70.8 kg)   SpO2 97%   BMI 28.53 kg/m    GEN: Well nourished, well developed, in no acute distress HEENT: normal Neck: no JVD or masses Cardiac: RRR; 2/6 SEM. No rubs, or gallops,no edema  Respiratory:  clear to auscultation bilaterally, normal work of breathing GI: soft, nontender, nondistended, +  BS MS: no deformity or atrophy Skin: warm and dry, no rash Neuro:  Alert and Oriented x 3, Strength and sensation are intact Psych: euthymic mood, full affect   Wt Readings from Last 3 Encounters:  04/01/18 156 lb (70.8 kg)  03/12/18 154 lb (69.9 kg)  03/04/18 154 lb 3.2 oz (69.9 kg)     Studies/Labs Reviewed:   EKG:  EKG is NOT ordered today.  Recent Labs: 02/27/2018: ALT 42; B Natriuretic Peptide 75.7 03/04/2018: BUN 12; Creatinine, Ser 0.91; Hemoglobin 11.3; Magnesium 2.0; Platelets 112; Potassium 3.7; Sodium 139   Lipid Panel No results found for: CHOL, TRIG, HDL, CHOLHDL, VLDL, LDLCALC, LDLDIRECT  Additional studies/ records that were reviewed today include:  TAVR OPERATIVE NOTE  Date of Procedure: 03/03/2018  Preoperative Diagnosis: Severe Aortic Stenosis  Postoperative Diagnosis: Same  Procedure:   Transcatheter Aortic Valve Replacement - Percutaneous Right Transfemoral Approach Edwards Sapien 3 THV (size 23 mm, model # 9600TFX, serial # [id number])  Co-Surgeons: Lauree Chandler, MD and Valentina Gu. Roxy Manns, MD  Pre-operative Echo Findings:   Severe aortic stenosis   Normal left ventricular systolic function Post-operative Echo Findings:   No paravalvular leak   Normal left ventricular systolic function  _____________   Echo 03/04/18 Study Conclusions - Left ventricle: The cavity size was normal. Wall thickness was   increased in a pattern of mild LVH. Systolic function was   vigorous. The estimated ejection fraction was in  the range of 65%   to 70%. Wall motion was normal; there were no regional wall   motion abnormalities. Doppler parameters are consistent with   abnormal left ventricular relaxation (grade 1 diastolic   dysfunction). - Aortic valve: s/p Edwards Sapien 3 THV. No obstruction. Small   perivalvular leak (oriented toward the pulmonary artery). Mean   gradient (S): 16 mm Hg. Peak gradient (S): 38 mm Hg. Valve area   (VTI): 1.23 cm^2. Valve area (Vmax): 1 cm^2. Valve area (Vmean):   1.27 cm^2. - Mitral valve: Mildly thickened leaflets . Moderate posterior MAC.   Trivial regurgitation. Valve area by pressure half-time: 1.88   cm^2. - Left atrium: The atrium was normal in size. - Inferior vena cava: The vessel was normal in size. The   respirophasic diameter changes were in the normal range (>= 50%),   consistent with normal central venous pressure. Impressions: - Compared to the echo yesterday, the LVEF is higher at 65-70%.   There is a small perivalvular leak. The TAVR valve gradients are   higher at 16 mmHg and 38 mmHg mean, respectively.  _____________  Echo 04/01/18 Study Conclusions - Left ventricle: The cavity size was normal. There was mild focal   basal hypertrophy of the septum. Systolic function was vigorous.   The estimated ejection fraction was in the range of 65% to 70%.   Wall motion was normal; there were no regional wall motion   abnormalities. Doppler parameters are consistent with abnormal   left ventricular relaxation (grade 1 diastolic dysfunction).   Longitudinal strain, 2D: -21.3 %. The calculated 3D left   ventricular ejection fraction was 72 % - Aortic valve: A prosthesis was present and functioning normally.   The prosthesis had a normal range of motion. The sewing ring   appeared normal, had no rocking motion, and showed no evidence of   dehiscence. There was moderate stenosis. There was mild   perivalvular regurgitation. Mean gradient (S): 30 mm Hg. Peak    gradient (S): 62 mm Hg. - Mitral  valve: Calcified annulus. Mildly thickened leaflets . The   findings are consistent with mild stenosis. Mean gradient (D): 5   mm Hg. Valve area by pressure half-time: 1.64 cm^2. - Atrial septum: No defect or patent foramen ovale was identified. Impressions: - S/P TAVR. Perivalvular leak is mild and similar to prior.   Gradients from parasternal windows suggest mean gradient of 20   mmHg, up slightly from prior. However, Doppler from right sternal   border shows a mean gradient of 30 mmHg with a peak gradient of   62 mmHg and velocity of 3.9 m/s. Given the vigorous contraction   of the LV and the shape of the Doppler signal, this RSB   measurement may be an LV/LVOT gradient. Recommend clinical   correlation, continued monitoring of gradient.   ASSESSMENT & PLAN:   Severe AS s/p TAVR: echo today shows EF 70%, normally functioning TAVR with mild PVL and moderate AS with mean gradient 20-30 mm Hg. I had Dr. Roxy Manns review her echo and he did not feel there was a significant difference from POD 1 echo. She has NYHA class I symptoms and is doing quite well clinically. SBE prophylaxis discussed; she has amoxicillin. Plavix can be discontinued after 6 months of therapy (08/2018). Given possible increase in gradients I will repeat an echo in 6 months for close follow up.   HTN: Bp well controlled back on home Toprol XL  HLD: continue statin   GERD:  Continue PPI    Medication Adjustments/Labs and Tests Ordered: Current medicines are reviewed at length with the patient today.  Concerns regarding medicines are outlined above.  Medication changes, Labs and Tests ordered today are listed in the Patient Instructions below. Patient Instructions  Medication Instructions:  1) You may STOP PLAVIX when you run out in May (09/01/18)  Labwork: None  Testing/Procedures: Your provider has requested that you have an echocardiogram in 1 year. Echocardiography is a painless  test that uses sound waves to create images of your heart. It provides your doctor with information about the size and shape of your heart and how well your heart's chambers and valves are working. This procedure takes approximately one hour. There are no restrictions for this procedure. You will be called to arrange this appointment.  Follow-Up: Your provider recommends that you schedule a follow-up appointment in 3 MONTHS with Dr. Lennox Pippins.  You will be called to arrange your 1 year TAVR echocardiogram and office visit with Nell Range.  Any Other Special Instructions Will Be Listed Below (If Applicable). Happy Holidays!      Signed, Angelena Form, PA-C  04/01/2018 2:56 PM    [facility] [address], [place], [place]  [address] Phone: [phone]; Fax: [phone]

## 2018-04-01 ENCOUNTER — Encounter: Payer: Self-pay | Admitting: Physician Assistant

## 2018-04-01 ENCOUNTER — Other Ambulatory Visit: Payer: Self-pay

## 2018-04-01 ENCOUNTER — Ambulatory Visit (INDEPENDENT_AMBULATORY_CARE_PROVIDER_SITE_OTHER): Payer: Medicare Other | Admitting: Physician Assistant

## 2018-04-01 ENCOUNTER — Ambulatory Visit (HOSPITAL_COMMUNITY): Payer: Medicare Other | Attending: Cardiology

## 2018-04-01 VITALS — BP 112/64 | HR 66 | Ht 62.0 in | Wt 156.0 lb

## 2018-04-01 DIAGNOSIS — Z952 Presence of prosthetic heart valve: Secondary | ICD-10-CM | POA: Insufficient documentation

## 2018-04-01 DIAGNOSIS — E782 Mixed hyperlipidemia: Secondary | ICD-10-CM | POA: Diagnosis not present

## 2018-04-01 DIAGNOSIS — I209 Angina pectoris, unspecified: Secondary | ICD-10-CM

## 2018-04-01 DIAGNOSIS — K219 Gastro-esophageal reflux disease without esophagitis: Secondary | ICD-10-CM

## 2018-04-01 DIAGNOSIS — I10 Essential (primary) hypertension: Secondary | ICD-10-CM | POA: Diagnosis not present

## 2018-04-01 NOTE — Patient Instructions (Signed)
Medication Instructions:  1) You may STOP PLAVIX when you run out in May (09/01/18)  Labwork: None  Testing/Procedures: Your provider has requested that you have an echocardiogram in 1 year. Echocardiography is a painless test that uses sound waves to create images of your heart. It provides your doctor with information about the size and shape of your heart and how well your heart's chambers and valves are working. This procedure takes approximately one hour. There are no restrictions for this procedure. You will be called to arrange this appointment.  Follow-Up: Your provider recommends that you schedule a follow-up appointment in 3 MONTHS with Dr. Lennox Pippins.  You will be called to arrange your 1 year TAVR echocardiogram and office visit with Nell Range.  Any Other Special Instructions Will Be Listed Below (If Applicable). Happy Holidays!

## 2018-04-17 ENCOUNTER — Encounter: Payer: Self-pay | Admitting: Gastroenterology

## 2018-04-28 DIAGNOSIS — K210 Gastro-esophageal reflux disease with esophagitis: Secondary | ICD-10-CM | POA: Diagnosis not present

## 2018-04-28 DIAGNOSIS — I10 Essential (primary) hypertension: Secondary | ICD-10-CM | POA: Diagnosis not present

## 2018-04-28 DIAGNOSIS — E785 Hyperlipidemia, unspecified: Secondary | ICD-10-CM | POA: Diagnosis not present

## 2018-04-28 DIAGNOSIS — E063 Autoimmune thyroiditis: Secondary | ICD-10-CM | POA: Diagnosis not present

## 2018-04-28 DIAGNOSIS — F419 Anxiety disorder, unspecified: Secondary | ICD-10-CM | POA: Diagnosis not present

## 2018-05-01 ENCOUNTER — Encounter: Payer: Self-pay | Admitting: Gastroenterology

## 2018-05-01 ENCOUNTER — Ambulatory Visit (INDEPENDENT_AMBULATORY_CARE_PROVIDER_SITE_OTHER): Payer: Medicare Other | Admitting: Gastroenterology

## 2018-05-01 VITALS — BP 134/84 | HR 65 | Ht 60.0 in | Wt 156.4 lb

## 2018-05-01 DIAGNOSIS — R1310 Dysphagia, unspecified: Secondary | ICD-10-CM

## 2018-05-01 DIAGNOSIS — K219 Gastro-esophageal reflux disease without esophagitis: Secondary | ICD-10-CM

## 2018-05-01 MED ORDER — OMEPRAZOLE 40 MG PO CPDR: 40.0000 mg | DELAYED_RELEASE_CAPSULE | Freq: Every day | ORAL | 3 refills | Status: DC

## 2018-05-01 NOTE — Patient Instructions (Signed)
If you are age 78 or older, your body mass index should be between 23-30. Your Body mass index is 30.54 kg/m. If this is out of the aforementioned range listed, please consider follow up with your Primary Care Provider.  If you are age 75 or younger, your body mass index should be between 19-25. Your Body mass index is 30.54 kg/m. If this is out of the aformentioned range listed, please consider follow up with your Primary Care Provider.   We have sent the following medications to your pharmacy for you to pick up at your convenience: Omeprazole 40 mg once daily.   You have been scheduled for a Barium Esophogram at [facility] (1st floor of the hospital) on 05/12/18 at 11am. Please arrive 15 minutes prior to your appointment for registration. Make certain not to have anything to eat or drink 3 hours prior to your test. If you need to reschedule for any reason, please contact radiology at [phone] to do so. __________________________________________________________________ A barium swallow is an examination that concentrates on views of the esophagus. This tends to be a double contrast exam (barium and two liquids which, when combined, create a gas to distend the wall of the oesophagus) or single contrast (non-ionic iodine based). The study is usually tailored to your symptoms so a good history is essential. Attention is paid during the study to the form, structure and configuration of the esophagus, looking for functional disorders (such as aspiration, dysphagia, achalasia, motility and reflux) EXAMINATION You may be asked to change into a gown, depending on the type of swallow being performed. A radiologist and radiographer will perform the procedure. The radiologist will advise you of the type of contrast selected for your procedure and direct you during the exam. You will be asked to stand, sit or lie in several different positions and to hold a small amount of fluid in your mouth before  being asked to swallow while the imaging is performed .In some instances you may be asked to swallow barium coated marshmallows to assess the motility of a solid food bolus. The exam can be recorded as a digital or video fluoroscopy procedure. POST PROCEDURE It will take 1-2 days for the barium to pass through your system. To facilitate this, it is important, unless otherwise directed, to increase your fluids for the next 24-48hrs and to resume your normal diet.  This test typically takes about 30 minutes to perform. __________________________________________________________________________________

## 2018-05-01 NOTE — Progress Notes (Signed)
Chief Complaint: Dysphagia  Referring Provider:  [name]., MD      ASSESSMENT AND PLAN;   #1.  Esophageal dysphagia (mostly with liquids).  #2. Severe AS s/p TAVR 03/03/2018. Followed by Dr Geraldo Pitter. 2DE- EF 70%, normally functioning TAVR with mild PVL and moderate AS with mean gradient 20-30 mm Hg. Doing quite well clinically.  #3.  History of tubular adenomas 08/2011 (was due for colonoscopy 08/2014).  Plan: - Omprazole 40mg  po qd (from protonix) #90, 4 refills.  She had better response with omeprazole. - Ba swallow with barium tablet. - If needed - EGD with dilatation only after cardiology clearance if Ba swallow is abnormal (note pt is getting off plavix 08/2018). - FU in June 2019.  If the barium swallow is negative and she has good response to omeprazole, then she would get EGD and colonoscopy electively in June/July once she is off Plavix.   HPI:    Shannon Mills is a 78 y.o. female  With intermittent dysphagia mostly to liquids, mid chest, ever since she has TAVR (Had TEE at that time).  Not really bad per patient.  Still wanted to make sure that there is no problems.  She denies having any significant problems with solids. Hence came to the GI clinic. Doing very well from the cardiac standpoint. Any significant shortness of breath. No nausea, vomiting, regurgitation, odynophagia.  No significant diarrhea or constipation.  There is no melena or hematochezia. No unintentional weight loss.  No abdominal pain. Has been on omeprazole previously with good results.  However, when she takes Protonix she would occasionally have heartburn as well.  History of colonic polyps-colonoscopy 08/2011 ([facility])-1 cm mid descending colon polyp status post polypectomy.  Biopsies tubular adenoma.  Mild sigmoid diverticulosis.  Was told to repeat in 3 years.  Patient did get letters. Past Medical History:  Diagnosis Date  . Anxiety   . Chronic reflux esophagitis   . Hx of colonic polyp     . Hypercholesterolemia   . Hypertension   . Hypothyroidism   . Interstitial cystitis   . Osteoporosis   . S/P TAVR (transcatheter aortic valve replacement)    23 mm Edwards Sapien 3 transcatheter heart valve placed via percutaneous right transfemoral approach   . Severe aortic stenosis   . Vitamin D deficiency     Past Surgical History:  Procedure Laterality Date  . CHOLECYSTECTOMY    . COLONOSCOPY  09/18/2011   Colonic polyp, status post polypectomy. Mild sigmoid diverticulosis. Otherwise grossly normal colonoscopy to terminal ileum   . FOOT SURGERY    . INTRAOPERATIVE TRANSTHORACIC ECHOCARDIOGRAM N/A 03/03/2018   Procedure: INTRAOPERATIVE TRANSTHORACIC ECHOCARDIOGRAM;  Surgeon: Burnell Blanks, MD;  Location: [place];  Service: Open Heart Surgery;  Laterality: N/A;  . RIGHT/LEFT HEART CATH AND CORONARY ANGIOGRAPHY N/A 02/03/2018   Procedure: RIGHT/LEFT HEART CATH AND CORONARY ANGIOGRAPHY;  Surgeon: Martinique, Peter M, MD;  Location: [place] [facility];  Service: Cardiovascular;  Laterality: N/A;  . TRANSCATHETER AORTIC VALVE REPLACEMENT, TRANSFEMORAL  03/03/2018  . TRANSCATHETER AORTIC VALVE REPLACEMENT, TRANSFEMORAL N/A 03/03/2018   Procedure: TRANSCATHETER AORTIC VALVE REPLACEMENT, TRANSFEMORAL;  Surgeon: Burnell Blanks, MD;  Location: [place];  Service: Open Heart Surgery;  Laterality: N/A;  . TUBAL LIGATION      Family History  Problem Relation Age of Onset  . Dementia Mother   . COPD Father   . Throat cancer Brother   . Colon cancer Neg Hx   .  Esophageal cancer Neg Hx     Social History   Tobacco Use  . Smoking status: Never Smoker  . Smokeless tobacco: Never Used  Substance Use Topics  . Alcohol use: No  . Drug use: No    Current Outpatient Medications  Medication Sig Dispense Refill  . amoxicillin (AMOXIL) 500 MG tablet Take 2000 mg (4 tabs) one hour before dental work 4 tablet 3  . aspirin EC 81 MG tablet Take 81 mg by mouth daily.    .  cetirizine (ZYRTEC) 10 MG tablet Take 10 mg by mouth at bedtime.    . cholecalciferol (VITAMIN D) 1000 units tablet Take 1,000 Units by mouth daily.    . clopidogrel (PLAVIX) 75 MG tablet Take 1 tablet (75 mg total) by mouth daily with breakfast. 90 tablet 1  . escitalopram (LEXAPRO) 10 MG tablet Take 10 mg by mouth at bedtime.     Marland Kitchen levothyroxine (SYNTHROID, LEVOTHROID) 50 MCG tablet Take 50 mcg by mouth daily before breakfast.    . metoprolol succinate (TOPROL XL) 25 MG 24 hr tablet Take 1 tablet (25 mg total) by mouth daily. 90 tablet 3  . pantoprazole (PROTONIX) 40 MG tablet Take 1 tablet (40 mg total) by mouth daily. 90 tablet 1  . rosuvastatin (CRESTOR) 40 MG tablet Take 40 mg by mouth at bedtime.      No current facility-administered medications for this visit.     Allergies  Allergen Reactions  . Levaquin [Levofloxacin] Other (See Comments)    UNSPECIFIED REACTION  "Makes her sick"    Review of Systems:  Constitutional: Denies fever, chills, diaphoresis, appetite change and fatigue.  HEENT: Denies photophobia, eye pain, redness, hearing loss, ear pain, congestion, sore throat, rhinorrhea, sneezing, mouth sores, neck pain, neck stiffness and tinnitus.   Respiratory: Denies SOB, DOE, cough, chest tightness,  and wheezing.   Cardiovascular: Denies chest pain, palpitations and leg swelling.  Genitourinary: Denies dysuria, urgency, frequency, hematuria, flank pain and difficulty urinating.  Musculoskeletal: Denies myalgias, back pain, joint swelling, arthralgias and gait problem.  Skin: No rash.  Neurological: Denies dizziness, seizures, syncope, weakness, light-headedness, numbness and headaches.  Hematological: Denies adenopathy. Easy bruising, personal or family bleeding history  Psychiatric/Behavioral: Has anxiety or depression     Physical Exam:    BP 134/84   Pulse 65   Ht 5' (1.524 m)   Wt 156 lb 6 oz (70.9 kg)   BMI 30.54 kg/m  Filed Weights   05/01/18 1535    Weight: 156 lb 6 oz (70.9 kg)   Constitutional:  Well-developed, in no acute distress. Psychiatric: Normal mood and affect. Behavior is normal. HEENT: Pupils normal.  Conjunctivae are normal. No scleral icterus. Neck supple.  Cardiovascular: Normal rate, regular rhythm. No edema Pulmonary/chest: Effort normal and breath sounds normal. No wheezing, rales or rhonchi. Abdominal: Soft, nondistended. Nontender. Bowel sounds active throughout. There are no masses palpable. No hepatomegaly. Rectal:  defered Neurological: Alert and oriented to person place and time. Skin: Skin is warm and dry. No rashes noted.  Data Reviewed: I have personally reviewed following labs and imaging studies  CBC: CBC Latest Ref Rng & Units 03/04/2018 03/03/2018 03/03/2018  WBC 4.0 - 10.5 K/uL 4.8 - -  Hemoglobin 12.0 - 15.0 g/dL 11.3(L) 11.2(L) 10.9(L)  Hematocrit 36.0 - 46.0 % 35.4(L) 33.0(L) 32.0(L)  Platelets 150 - 400 K/uL 112(L) - -    CMP: CMP Latest Ref Rng & Units 03/04/2018 03/03/2018 03/03/2018  Glucose 70 - 99 mg/dL  99 117(H) 122(H)  BUN 8 - 23 mg/dL 12 17 17   Creatinine 0.44 - 1.00 mg/dL 0.91 0.90 0.90  Sodium 135 - 145 mmol/L 139 141 140  Potassium 3.5 - 5.1 mmol/L 3.7 3.8 4.0  Chloride 98 - 111 mmol/L 110 105 105  CO2 22 - 32 mmol/L 26 - -  Calcium 8.9 - 10.3 mg/dL 8.6(L) - -  Total Protein 6.5 - 8.1 g/dL - - -  Total Bilirubin 0.3 - 1.2 mg/dL - - -  Alkaline Phos 38 - 126 U/L - - -  AST 15 - 41 U/L - - -  ALT 0 - 44 U/L - - -     Carmell Austria, MD 05/01/2018, 3:46 PM  Cc: [name]., MD

## 2018-05-08 ENCOUNTER — Encounter: Payer: Self-pay | Admitting: Thoracic Surgery (Cardiothoracic Vascular Surgery)

## 2018-05-12 ENCOUNTER — Other Ambulatory Visit: Payer: Self-pay | Admitting: Gastroenterology

## 2018-05-12 ENCOUNTER — Ambulatory Visit (HOSPITAL_COMMUNITY)
Admission: RE | Admit: 2018-05-12 | Discharge: 2018-05-12 | Disposition: A | Discharge status: Home / Self Care | Payer: Medicare Other | Source: Ambulatory Visit | Attending: Gastroenterology | Admitting: Gastroenterology

## 2018-05-12 DIAGNOSIS — R1310 Dysphagia, unspecified: Secondary | ICD-10-CM

## 2018-05-12 DIAGNOSIS — K449 Diaphragmatic hernia without obstruction or gangrene: Secondary | ICD-10-CM | POA: Diagnosis not present

## 2018-05-12 DIAGNOSIS — K219 Gastro-esophageal reflux disease without esophagitis: Secondary | ICD-10-CM | POA: Diagnosis not present

## 2018-06-08 ENCOUNTER — Emergency Department (HOSPITAL_COMMUNITY): Payer: Medicare Other

## 2018-06-08 ENCOUNTER — Observation Stay (HOSPITAL_COMMUNITY)
Admission: EM | Admit: 2018-06-08 | Discharge: 2018-06-09 | Disposition: A | Discharge status: Home / Self Care | Payer: Medicare Other | Attending: Internal Medicine | Admitting: Internal Medicine

## 2018-06-08 ENCOUNTER — Telehealth: Payer: Self-pay | Admitting: Physician Assistant

## 2018-06-08 ENCOUNTER — Other Ambulatory Visit: Payer: Self-pay

## 2018-06-08 ENCOUNTER — Encounter (HOSPITAL_COMMUNITY): Payer: Self-pay

## 2018-06-08 DIAGNOSIS — K449 Diaphragmatic hernia without obstruction or gangrene: Secondary | ICD-10-CM | POA: Diagnosis not present

## 2018-06-08 DIAGNOSIS — I119 Hypertensive heart disease without heart failure: Secondary | ICD-10-CM | POA: Diagnosis not present

## 2018-06-08 DIAGNOSIS — K2100 Gastro-esophageal reflux disease with esophagitis, without bleeding: Secondary | ICD-10-CM | POA: Diagnosis present

## 2018-06-08 DIAGNOSIS — N289 Disorder of kidney and ureter, unspecified: Secondary | ICD-10-CM | POA: Insufficient documentation

## 2018-06-08 DIAGNOSIS — R079 Chest pain, unspecified: Secondary | ICD-10-CM

## 2018-06-08 DIAGNOSIS — Z888 Allergy status to other drugs, medicaments and biological substances status: Secondary | ICD-10-CM | POA: Insufficient documentation

## 2018-06-08 DIAGNOSIS — E039 Hypothyroidism, unspecified: Secondary | ICD-10-CM | POA: Diagnosis present

## 2018-06-08 DIAGNOSIS — Z953 Presence of xenogenic heart valve: Secondary | ICD-10-CM | POA: Diagnosis not present

## 2018-06-08 DIAGNOSIS — Z7902 Long term (current) use of antithrombotics/antiplatelets: Secondary | ICD-10-CM | POA: Diagnosis not present

## 2018-06-08 DIAGNOSIS — I2510 Atherosclerotic heart disease of native coronary artery without angina pectoris: Secondary | ICD-10-CM | POA: Insufficient documentation

## 2018-06-08 DIAGNOSIS — R072 Precordial pain: Principal | ICD-10-CM | POA: Insufficient documentation

## 2018-06-08 DIAGNOSIS — I10 Essential (primary) hypertension: Secondary | ICD-10-CM | POA: Diagnosis present

## 2018-06-08 DIAGNOSIS — Z79899 Other long term (current) drug therapy: Secondary | ICD-10-CM | POA: Diagnosis not present

## 2018-06-08 DIAGNOSIS — I080 Rheumatic disorders of both mitral and aortic valves: Secondary | ICD-10-CM | POA: Diagnosis not present

## 2018-06-08 DIAGNOSIS — E559 Vitamin D deficiency, unspecified: Secondary | ICD-10-CM | POA: Insufficient documentation

## 2018-06-08 DIAGNOSIS — Z7982 Long term (current) use of aspirin: Secondary | ICD-10-CM | POA: Insufficient documentation

## 2018-06-08 DIAGNOSIS — K219 Gastro-esophageal reflux disease without esophagitis: Secondary | ICD-10-CM | POA: Insufficient documentation

## 2018-06-08 DIAGNOSIS — F419 Anxiety disorder, unspecified: Secondary | ICD-10-CM | POA: Insufficient documentation

## 2018-06-08 DIAGNOSIS — R0602 Shortness of breath: Secondary | ICD-10-CM | POA: Diagnosis not present

## 2018-06-08 DIAGNOSIS — E871 Hypo-osmolality and hyponatremia: Secondary | ICD-10-CM | POA: Insufficient documentation

## 2018-06-08 DIAGNOSIS — Z881 Allergy status to other antibiotic agents status: Secondary | ICD-10-CM | POA: Insufficient documentation

## 2018-06-08 DIAGNOSIS — Z952 Presence of prosthetic heart valve: Secondary | ICD-10-CM

## 2018-06-08 DIAGNOSIS — E785 Hyperlipidemia, unspecified: Secondary | ICD-10-CM | POA: Insufficient documentation

## 2018-06-08 DIAGNOSIS — Z79890 Hormone replacement therapy (postmenopausal): Secondary | ICD-10-CM | POA: Insufficient documentation

## 2018-06-08 HISTORY — DX: Chest pain, unspecified: R07.9

## 2018-06-08 LAB — BASIC METABOLIC PANEL
Anion gap: 9 (ref 5–15)
BUN: 14 mg/dL (ref 8–23)
CO2: 23 mmol/L (ref 22–32)
CREATININE: 1.04 mg/dL — AB (ref 0.44–1.00)
Calcium: 9 mg/dL (ref 8.9–10.3)
Chloride: 102 mmol/L (ref 98–111)
GFR calc non Af Amer: 52 mL/min — ABNORMAL LOW (ref >60)
Glucose, Bld: 83 mg/dL (ref 70–99)
Potassium: 4.5 mmol/L (ref 3.5–5.1)
SODIUM: 134 mmol/L — AB (ref 135–145)

## 2018-06-08 LAB — CBC
HEMATOCRIT: 38.7 % (ref 36.0–46.0)
Hemoglobin: 12.7 g/dL (ref 12.0–15.0)
MCH: 28.9 pg (ref 26.0–34.0)
MCHC: 32.8 g/dL (ref 30.0–36.0)
MCV: 88.2 fL (ref 80.0–100.0)
NRBC: 0 % (ref 0.0–0.2)
PLATELETS: 183 10*3/uL (ref 150–400)
RBC: 4.39 MIL/uL (ref 3.87–5.11)
RDW: 12.8 % (ref 11.5–15.5)
WBC: 4.9 10*3/uL (ref 4.0–10.5)

## 2018-06-08 LAB — TROPONIN I
Troponin I: <0.03 ng/mL (ref <0.03)
Troponin I: <0.03 ng/mL (ref <0.03)

## 2018-06-08 LAB — I-STAT TROPONIN, ED: Troponin i, poc: 0.01 ng/mL (ref 0.00–0.08)

## 2018-06-08 MED ORDER — ACETAMINOPHEN 325 MG PO TABS: 650.0000 mg | ORAL_TABLET | ORAL | Status: DC | PRN

## 2018-06-08 MED ORDER — ONDANSETRON HCL 4 MG/2ML IJ SOLN: 4.0000 mg | Freq: Four times a day (QID) | INTRAMUSCULAR | Status: DC | PRN

## 2018-06-08 MED ORDER — SODIUM CHLORIDE 0.9% FLUSH
3.0000 mL | Freq: Once | INTRAVENOUS | Status: AC
  Administered 2018-06-08: 3 mL via INTRAVENOUS

## 2018-06-08 MED ORDER — MORPHINE SULFATE (PF) 2 MG/ML IV SOLN: 2.0000 mg | INTRAVENOUS | Status: DC | PRN

## 2018-06-08 MED ORDER — PANTOPRAZOLE SODIUM 40 MG PO TBEC
40.0000 mg | DELAYED_RELEASE_TABLET | Freq: Every day | ORAL | Status: DC
  Administered 2018-06-08 – 2018-06-09 (×2): 40 mg via ORAL
  Filled 2018-06-08 (×2): qty 1

## 2018-06-08 MED ORDER — ALPRAZOLAM 0.25 MG PO TABS: 0.2500 mg | ORAL_TABLET | Freq: Two times a day (BID) | ORAL | Status: DC | PRN

## 2018-06-08 MED ORDER — ASPIRIN EC 81 MG PO TBEC
81.0000 mg | DELAYED_RELEASE_TABLET | Freq: Every day | ORAL | Status: DC
  Administered 2018-06-09: 81 mg via ORAL
  Filled 2018-06-08: qty 1

## 2018-06-08 MED ORDER — ENOXAPARIN SODIUM 40 MG/0.4ML ~~LOC~~ SOLN
40.0000 mg | SUBCUTANEOUS | Status: DC
  Administered 2018-06-08: 40 mg via SUBCUTANEOUS
  Filled 2018-06-08: qty 0.4

## 2018-06-08 MED ORDER — CLOPIDOGREL BISULFATE 75 MG PO TABS
75.0000 mg | ORAL_TABLET | Freq: Every day | ORAL | Status: DC
  Administered 2018-06-09: 75 mg via ORAL
  Filled 2018-06-08: qty 1

## 2018-06-08 MED ORDER — ESCITALOPRAM OXALATE 10 MG PO TABS
10.0000 mg | ORAL_TABLET | Freq: Every day | ORAL | Status: DC
  Administered 2018-06-08: 10 mg via ORAL
  Filled 2018-06-08: qty 1

## 2018-06-08 MED ORDER — LORATADINE 10 MG PO TABS
10.0000 mg | ORAL_TABLET | Freq: Every day | ORAL | Status: DC
  Administered 2018-06-08 – 2018-06-09 (×2): 10 mg via ORAL
  Filled 2018-06-08 (×2): qty 1

## 2018-06-08 MED ORDER — ASPIRIN 81 MG PO CHEW
324.0000 mg | CHEWABLE_TABLET | Freq: Once | ORAL | Status: AC
  Administered 2018-06-08: 324 mg via ORAL
  Filled 2018-06-08: qty 4

## 2018-06-08 MED ORDER — METOPROLOL SUCCINATE ER 25 MG PO TB24
25.0000 mg | ORAL_TABLET | Freq: Every day | ORAL | Status: DC
  Administered 2018-06-08 – 2018-06-09 (×2): 25 mg via ORAL
  Filled 2018-06-08 (×2): qty 1

## 2018-06-08 MED ORDER — ROSUVASTATIN CALCIUM 20 MG PO TABS
40.0000 mg | ORAL_TABLET | Freq: Every day | ORAL | Status: DC
  Administered 2018-06-08: 40 mg via ORAL
  Filled 2018-06-08: qty 2

## 2018-06-08 MED ORDER — LEVOTHYROXINE SODIUM 50 MCG PO TABS
50.0000 ug | ORAL_TABLET | Freq: Every day | ORAL | Status: DC
  Administered 2018-06-09: 50 ug via ORAL
  Filled 2018-06-08: qty 1

## 2018-06-08 NOTE — Telephone Encounter (Signed)
New message   Pt c/o of Chest Pain: STAT if CP now or developed within 24 hours  1. Are you having CP right now? Patient's daughter states that she feels pressure in her chest   2. Are you experiencing any other symptoms (ex. SOB, nausea, vomiting, sweating)? Tired   3. How long have you been experiencing CP? Patient's daughter states that it started yesterday  4. Is your CP continuous or coming and going? Coming and going   5. Have you taken Nitroglycerin? No  ?

## 2018-06-08 NOTE — H&P (Signed)
[facility] History and Physical   Patient: Shannon Mills:[id number]   PCP: [name]., MD DOB: [birth year]   DOA: 06/08/2018   DOS: 06/08/2018   DOS: the patient was seen and examined on 06/08/2018  Patient coming from: The patient is coming from home.  Chief Complaint: chest pain  HPI: Shannon Mills is a 78 y.o. female with Past medical history of HTN, S/P TAVR, HLD, hypothyroid. Pt has chest pain ongoing since last 1-1/79-month primarily occurring on exertion associated with shortness of breath and getting better with rest. Pain is described as substernal across the chest feeling like heaviness and tightness. She actually reports that this is similar symptoms that she had before her aortic stenosis repair as well. No nausea no vomiting no fever no chills. She remains compliant with her aspirin and Plavix and denies any other recent change in her medications.  ED Course: chest pain resolved, no acute complains  At her baseline ambulates home And is independent for most of her ADL; manages her medication on her own.  Review of Systems: as mentioned in the history of present illness.  All other systems reviewed and are negative.  Past Medical History:  Diagnosis Date  . Anxiety   . Chronic reflux esophagitis   . Hx of colonic polyp   . Hypercholesterolemia   . Hypertension   . Hypothyroidism   . Interstitial cystitis   . Osteoporosis   . S/P TAVR (transcatheter aortic valve replacement)    23 mm Edwards Sapien 3 transcatheter heart valve placed via percutaneous right transfemoral approach   . Severe aortic stenosis   . Vitamin D deficiency    Past Surgical History:  Procedure Laterality Date  . CHOLECYSTECTOMY    . COLONOSCOPY  09/18/2011   Colonic polyp, status post polypectomy. Mild sigmoid diverticulosis. Otherwise grossly normal colonoscopy to terminal ileum   . FOOT SURGERY    . INTRAOPERATIVE TRANSTHORACIC ECHOCARDIOGRAM N/A 03/03/2018   Procedure:  INTRAOPERATIVE TRANSTHORACIC ECHOCARDIOGRAM;  Surgeon: Burnell Blanks, MD;  Location: [place];  Service: Open Heart Surgery;  Laterality: N/A;  . RIGHT/LEFT HEART CATH AND CORONARY ANGIOGRAPHY N/A 02/03/2018   Procedure: RIGHT/LEFT HEART CATH AND CORONARY ANGIOGRAPHY;  Surgeon: Martinique, Peter M, MD;  Location: [place] [facility];  Service: Cardiovascular;  Laterality: N/A;  . TRANSCATHETER AORTIC VALVE REPLACEMENT, TRANSFEMORAL  03/03/2018  . TRANSCATHETER AORTIC VALVE REPLACEMENT, TRANSFEMORAL N/A 03/03/2018   Procedure: TRANSCATHETER AORTIC VALVE REPLACEMENT, TRANSFEMORAL;  Surgeon: Burnell Blanks, MD;  Location: [place];  Service: Open Heart Surgery;  Laterality: N/A;  . TUBAL LIGATION     Social History:  reports that she has never smoked. She has never used smokeless tobacco. She reports that she does not drink alcohol or use drugs.  Allergies  Allergen Reactions  . Wellbutrin [Bupropion] Nausea Only    EXTREME NAUSEA  . Levaquin [Levofloxacin] Nausea And Vomiting   Family History  Problem Relation Age of Onset  . Dementia Mother   . COPD Father   . Throat cancer Brother   . Colon cancer Neg Hx   . Esophageal cancer Neg Hx      Prior to Admission medications   Medication Sig Start Date End Date Taking? Authorizing Provider  acetaminophen (TYLENOL) 500 MG tablet Take 500-1,000 mg by mouth every 8 (eight) hours as needed (for headaches).    Yes [provider]  amoxicillin (AMOXIL) 500 MG capsule Take 2,000 mg by mouth See admin instructions. Take 2,000 mg  by mouth one hour prior to dental procedures 04/28/18  Yes [provider]  aspirin EC 81 MG tablet Take 81 mg by mouth daily.   Yes [provider]  cetirizine (ZYRTEC) 10 MG tablet Take 10 mg by mouth at bedtime.   Yes [provider]  Cholecalciferol (VITAMIN D-3) 25 MCG (1000 UT) CAPS Take 1,000 Units by mouth daily.   Yes [provider]  clopidogrel (PLAVIX) 75 MG  tablet Take 1 tablet (75 mg total) by mouth daily with breakfast. 03/05/18  Yes Eileen Stanford, PA-C  escitalopram (LEXAPRO) 10 MG tablet Take 10 mg by mouth at bedtime.    Yes [provider]  levothyroxine (SYNTHROID, LEVOTHROID) 50 MCG tablet Take 50 mcg by mouth daily before breakfast.   Yes [provider]  metoprolol succinate (TOPROL XL) 25 MG 24 hr tablet Take 1 tablet (25 mg total) by mouth daily. Patient taking differently: Take 25 mg by mouth every evening.  03/12/18  Yes Eileen Stanford, PA-C  omeprazole (PRILOSEC) 40 MG capsule Take 1 capsule (40 mg total) by mouth daily. Patient taking differently: Take 40 mg by mouth at bedtime.  05/01/18  Yes Jackquline Denmark, MD  rosuvastatin (CRESTOR) 40 MG tablet Take 40 mg by mouth at bedtime.    Yes [provider]  amoxicillin (AMOXIL) 500 MG tablet Take 2000 mg (4 tabs) one hour before dental work Patient not taking: Reported on 06/08/2018 03/12/18   Eileen Stanford, PA-C  pantoprazole (PROTONIX) 40 MG tablet Take 1 tablet (40 mg total) by mouth daily. Patient not taking: Reported on 06/08/2018 03/05/18   Eileen Stanford, [place]    Physical Exam: Vitals:   06/08/18 1815 06/08/18 1830 06/08/18 1845 06/08/18 1940  BP: (!) 165/63 (!) 158/58 (!) 144/68 (!) 153/89  Pulse: 65 66 65 68  Resp: 16 10 17 18   Temp:    97.8 F (36.6 C)  TempSrc:    Oral  SpO2: 98% 98% 97% 97%  Weight:    69.9 kg  Height:    5' (1.524 m)    General: Alert, Awake and Oriented to Time, Place and Person. Appear in mild distress, affect appropriate Eyes: PERRL, Conjunctiva normal ENT: Oral Mucosa clear moist. Neck: no JVD, no Abnormal Mass Or lumps Cardiovascular: S1 and S2 Present, no Murmur, Peripheral Pulses Present Respiratory: normal respiratory effort, Bilateral Air entry equal and Decreased, no use of accessory muscle, Clear to Auscultation, no Crackles, no wheezes Abdomen: Bowel Sound present, Soft and no tenderness,  no hernia Skin: no redness, no Rash, no induration Extremities: no Pedal edema, no calf tenderness Neurologic: Grossly no focal neuro deficit. Bilaterally Equal motor strength  Labs on Admission:  CBC: Recent Labs  Lab 06/08/18 1225  WBC 4.9  HGB 12.7  HCT 38.7  MCV 88.2  PLT 856   Basic Metabolic Panel: Recent Labs  Lab 06/08/18 1225  NA 134*  K 4.5  CL 102  CO2 23  GLUCOSE 83  BUN 14  CREATININE 1.04*  CALCIUM 9.0   GFR: Estimated Creatinine Clearance: 39.5 mL/min (A) (by C-G formula based on SCr of 1.04 mg/dL (H)). Liver Function Tests: No results for input(s): AST, ALT, ALKPHOS, BILITOT, PROT, ALBUMIN in the last 168 hours. No results for input(s): LIPASE, AMYLASE in the last 168 hours. No results for input(s): AMMONIA in the last 168 hours. Coagulation Profile: No results for input(s): INR, PROTIME in the last 168 hours. Cardiac Enzymes: Recent Labs  Lab  06/08/18 1920  TROPONINI <0.03   BNP (last 3 results) No results for input(s): PROBNP in the last 8760 hours. HbA1C: No results for input(s): HGBA1C in the last 72 hours. CBG: No results for input(s): GLUCAP in the last 168 hours. Lipid Profile: No results for input(s): CHOL, HDL, LDLCALC, TRIG, CHOLHDL, LDLDIRECT in the last 72 hours. Thyroid Function Tests: No results for input(s): TSH, T4TOTAL, FREET4, T3FREE, THYROIDAB in the last 72 hours. Anemia Panel: No results for input(s): VITAMINB12, FOLATE, FERRITIN, TIBC, IRON, RETICCTPCT in the last 72 hours. Urine analysis:    Component Value Date/Time   COLORURINE COLORLESS (A) 02/27/2018 0950   APPEARANCEUR CLEAR 02/27/2018 0950   LABSPEC 1.002 (L) 02/27/2018 0950   PHURINE 6.0 02/27/2018 0950   GLUCOSEU NEGATIVE 02/27/2018 0950   HGBUR MODERATE (A) 02/27/2018 0950   BILIRUBINUR NEGATIVE 02/27/2018 0950   KETONESUR NEGATIVE 02/27/2018 0950   PROTEINUR NEGATIVE 02/27/2018 0950   NITRITE NEGATIVE 02/27/2018 0950   LEUKOCYTESUR NEGATIVE 02/27/2018  0950    Radiological Exams on Admission: Dg Chest 2 View  Result Date: 06/08/2018 CLINICAL DATA:  Chest heaviness and shortness of breath. Recent TAVR. EXAM: CHEST - 2 VIEW COMPARISON:  03/03/2018 FINDINGS: Heart size is normal. TAVR in place, without change in orientation. Aortic atherosclerosis. The lungs are clear. The vascularity is normal. No effusions. No significant bone finding. IMPRESSION: Previous TAVR.  No active disease. Electronically Signed   By: Nelson Chimes M.D.   On: 06/08/2018 13:09   EKG: Independently reviewed. normal sinus rhythm, nonspecific ST and T waves changes.  Assessment/Plan 1. Chest pain Patient has normal coronary arteries on cardiac catheterization in October 19. Echocardiogram post TAVR showed moderate aortic stenosis with increasing gradient. Concern is that whether the gradient is worsening further making the patient symptomatic. We will consult cardiology for further assistance. Monitor serial troponin although less likely ACS in this presence of normal coronary arteries. Echocardiogram in the morning to evaluate aortic valve. Cardiology consult. Continue aspirin Plavix and other cardiac medication.  2.  History of anxiety. Patient has been on Wellbutrin but she was not able to tolerate that medication and therefore she slowly stopped taking that medication. 2 weeks ago she called her PCP to give her her Lexapro prescription which we see started taking 2 weeks ago. Suspect that there is a chance that patient does not have any overlap between these 2 antidepressant which is leading to persistent anxiety causing patient to have chest pain without any organic etiology. If the echocardiogram is unremarkable anxiety could be the reason for which patient will continue to take Lexapro.  3. GERD. Hiatal hernia. Recently had a barium swallow in which she had mild hiatal hernia. Patient is on PPI at home which we will continue.  4.  Hypothyroidism. Continue  Synthroid.  5.  Dyslipidemia. Continue Crestor.  Nutrition: cardiac diet DVT Prophylaxis: subcutaneous Heparin  Advance goals of care discussion: full code   Consults: cardiology  Family Communication: no family was present at bedside, at the time of interview.  Disposition: Admitted as observation, telemetry unit. Likely to be discharged home, in 1 day.  Author: Berle Mull, MD [facility] 06/08/2018  To reach On-call, see care teams to locate the attending and reach out to them via [email or web address]. If 7PM-7AM, please contact night-coverage If you still have difficulty reaching the attending provider, please page the [facility] (Director on Call) for [facility] on amion for assistance.

## 2018-06-08 NOTE — ED Triage Notes (Signed)
Pt reports chest pressure that has been intermittent since she had TAVR procedure in November. Pt reports feeling fatigued and requires a lot of rest. No distress noted. Skin warm and dry.

## 2018-06-08 NOTE — Telephone Encounter (Signed)
Patient's daughter reports patient is having centralized chest pain that comes and goes for several days now. Patient was advised to bee seen in the emergency department for this, daughter verbally understands.

## 2018-06-08 NOTE — ED Provider Notes (Signed)
I saw and evaluated the patient, reviewed the resident's note and I agree with the findings and plan.  Pertinent History: here with CP - had TAVR in 11/20, has pressure in the center of chest with exertion - HEART score of 5, CP free at this time.  No syncope.  Pertinent Exam findings: no edema, no lungs clear, no murmur.  I was personally present and directly supervised the following procedures:  Med Resuciscitation  I personally interpreted the EKG as well as the resident and agree with the interpretation on the resident's chart.  Final diagnoses:  Chest pain, unspecified type      Noemi Chapel, MD 06/09/18 0040

## 2018-06-08 NOTE — ED Provider Notes (Signed)
[facility] Provider Note   CSN: [id number] Arrival date & time: 06/08/18  1208    History   Chief Complaint Chief Complaint  Patient presents with  . Chest Pain    HPI Shannon Mills is a 78 y.o. female.      Chest Pain  Pain location:  Substernal area Pain quality: aching and pressure   Pain quality: not radiating, not stabbing and not tearing   Pain radiates to:  Does not radiate Pain severity:  Moderate Onset quality:  Unable to specify Duration:  2 months Timing:  Intermittent Progression:  Waxing and waning Chronicity:  New Context comment:  Exertion Relieved by:  None tried Worsened by:  Nothing Ineffective treatments:  None tried Associated symptoms: no abdominal pain, no altered mental status, no back pain, no cough, no dizziness, no fever, no lower extremity edema, no palpitations, no shortness of breath and no vomiting   Risk factors: high cholesterol and hypertension   Risk factors: no smoking     Past Medical History:  Diagnosis Date  . Anxiety   . Chronic reflux esophagitis   . Hx of colonic polyp   . Hypercholesterolemia   . Hypertension   . Hypothyroidism   . Interstitial cystitis   . Osteoporosis   . S/P TAVR (transcatheter aortic valve replacement)    23 mm Edwards Sapien 3 transcatheter heart valve placed via percutaneous right transfemoral approach   . Severe aortic stenosis   . Vitamin D deficiency     Patient Active Problem List   Diagnosis Date Noted  . Chest pain 06/08/2018  . Severe aortic stenosis   . Hypothyroidism   . Hypertension   . Hypercholesterolemia   . Chronic reflux esophagitis   . S/P TAVR (transcatheter aortic valve replacement)   . Essential hypertension 01/29/2018  . 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 CHOLECYSTECTOMY    . COLONOSCOPY  09/18/2011   Colonic polyp, status post  polypectomy. Mild sigmoid diverticulosis. Otherwise grossly normal colonoscopy to terminal ileum   . FOOT SURGERY    . INTRAOPERATIVE TRANSTHORACIC ECHOCARDIOGRAM N/A 03/03/2018   Procedure: INTRAOPERATIVE TRANSTHORACIC ECHOCARDIOGRAM;  Surgeon: Burnell Blanks, MD;  Location: [place];  Service: Open Heart Surgery;  Laterality: N/A;  . RIGHT/LEFT HEART CATH AND CORONARY ANGIOGRAPHY N/A 02/03/2018   Procedure: RIGHT/LEFT HEART CATH AND CORONARY ANGIOGRAPHY;  Surgeon: Martinique, Peter M, MD;  Location: [place] [facility];  Service: Cardiovascular;  Laterality: N/A;  . TRANSCATHETER AORTIC VALVE REPLACEMENT, TRANSFEMORAL  03/03/2018  . TRANSCATHETER AORTIC VALVE REPLACEMENT, TRANSFEMORAL N/A 03/03/2018   Procedure: TRANSCATHETER AORTIC VALVE REPLACEMENT, TRANSFEMORAL;  Surgeon: Burnell Blanks, MD;  Location: [place];  Service: Open Heart Surgery;  Laterality: N/A;  . TUBAL LIGATION       OB History   No obstetric history on file.      Home Medications    Prior to Admission medications   Medication Sig Start Date End Date Taking? Authorizing Provider  acetaminophen (TYLENOL) 500 MG tablet Take 500-1,000 mg by mouth every 8 (eight) hours as needed (for headaches).    Yes [provider]  amoxicillin (AMOXIL) 500 MG capsule Take 2,000 mg by mouth See admin instructions. Take 2,000 mg by mouth one hour prior to dental procedures 04/28/18  Yes [provider]  aspirin EC 81 MG tablet Take 81 mg by mouth daily.   Yes [provider]  cetirizine (ZYRTEC) 10 MG tablet Take 10 mg by mouth at bedtime.   Yes [provider]  Cholecalciferol (VITAMIN D-3) 25 MCG (1000 UT) CAPS Take 1,000 Units by mouth daily.   Yes [provider]  clopidogrel (PLAVIX) 75 MG tablet Take 1 tablet (75 mg total) by mouth daily with breakfast. 03/05/18  Yes Eileen Stanford, PA-C  escitalopram (LEXAPRO) 10 MG tablet Take 10 mg by mouth at bedtime.    Yes [provider]  levothyroxine (SYNTHROID, LEVOTHROID) 50 MCG tablet Take 50 mcg by mouth daily before breakfast.   Yes [provider]  metoprolol succinate (TOPROL XL) 25 MG 24 hr tablet Take 1 tablet (25 mg total) by mouth daily. Patient taking differently: Take 25 mg by mouth every evening.  03/12/18  Yes Eileen Stanford, PA-C  omeprazole (PRILOSEC) 40 MG capsule Take 1 capsule (40 mg total) by mouth daily. Patient taking differently: Take 40 mg by mouth at bedtime.  05/01/18  Yes Jackquline Denmark, MD  rosuvastatin (CRESTOR) 40 MG tablet Take 40 mg by mouth at bedtime.    Yes [provider]  amoxicillin (AMOXIL) 500 MG tablet Take 2000 mg (4 tabs) one hour before dental work Patient not taking: Reported on 06/08/2018 03/12/18   Eileen Stanford, PA-C  pantoprazole (PROTONIX) 40 MG tablet Take 1 tablet (40 mg total) by mouth daily. Patient not taking: Reported on 06/08/2018 03/05/18   Eileen Stanford, PA-C    Family History Family History  Problem Relation Age of Onset  . Dementia Mother   . COPD Father   . Throat cancer Brother   . Colon cancer Neg Hx   . Esophageal cancer Neg Hx     Social History Social History   Tobacco Use  . Smoking status: Never Smoker  . Smokeless tobacco: Never Used  Substance Use Topics  . Alcohol use: No  . Drug use: No     Allergies   Wellbutrin [bupropion] and Levaquin [levofloxacin]   Review of Systems Review of Systems  Constitutional: Negative for chills and fever.  HENT: Negative for ear pain and sore throat.   Eyes: Negative for pain and visual disturbance.  Respiratory: Negative for cough and shortness of breath.   Cardiovascular: Positive for chest pain. Negative for palpitations.  Gastrointestinal: Negative for abdominal pain and vomiting.  Genitourinary: Negative for dysuria and hematuria.  Musculoskeletal: Negative for arthralgias and back pain.  Skin: Negative for color change and rash.  Neurological:  Negative for dizziness, seizures and syncope.  All other systems reviewed and are negative.    Physical Exam Updated Vital Signs BP (!) 145/70   Pulse 66   Temp (!) 97.4 F (36.3 C) (Oral)   Resp 18   SpO2 97%   Physical Exam Vitals signs and nursing note reviewed.  Constitutional:      General: She is not in acute distress.    Appearance: She is well-developed.     Comments: Patient resting comfortably, chest pain-free.  HENT:     Head: Normocephalic and atraumatic.  Eyes:     Conjunctiva/sclera: Conjunctivae normal.  Neck:     Musculoskeletal: Neck supple.  Cardiovascular:     Rate and Rhythm: Normal rate and regular rhythm.     Heart sounds: No murmur.  Pulmonary:     Effort: Pulmonary effort is normal. No respiratory distress.     Breath sounds: Normal breath sounds.  Chest:     Chest wall: No mass or  tenderness.  Abdominal:     General: There is no abdominal bruit.     Palpations: Abdomen is soft.     Tenderness: There is no abdominal tenderness.  Skin:    General: Skin is warm and dry.  Neurological:     Mental Status: She is alert.      ED Treatments / Results  Labs (all labs ordered are listed, but only abnormal results are displayed) Labs Reviewed  BASIC METABOLIC PANEL - Abnormal; Notable for the following components:      Result Value   Sodium 134 (*)    Creatinine, Ser 1.04 (*)    GFR calc non Af Amer 52 (*)    All other components within normal limits  CBC  I-STAT TROPONIN, ED    EKG EKG Interpretation  Date/Time:  Monday June 08 2018 12:16:36 EST Ventricular Rate:  63 PR Interval:  186 QRS Duration: 82 QT Interval:  412 QTC Calculation: 421 R Axis:   68 Text Interpretation:  Normal sinus rhythm Normal ECG since last tracing no significant change Confirmed by Noemi Chapel [phone]) on 06/08/2018 3:26:28 PM   Radiology Dg Chest 2 View  Result Date: 06/08/2018 CLINICAL DATA:  Chest heaviness and shortness of breath. Recent TAVR.  EXAM: CHEST - 2 VIEW COMPARISON:  03/03/2018 FINDINGS: Heart size is normal. TAVR in place, without change in orientation. Aortic atherosclerosis. The lungs are clear. The vascularity is normal. No effusions. No significant bone finding. IMPRESSION: Previous TAVR.  No active disease. Electronically Signed   By: Nelson Chimes M.D.   On: 06/08/2018 13:09    Procedures Procedures (including critical care time)  Medications Ordered in ED Medications  sodium chloride flush (NS) 0.9 % injection 3 mL (3 mLs Intravenous Given 06/08/18 1614)  aspirin chewable tablet 324 mg (324 mg Oral Given 06/08/18 1631)     Initial Impression / Assessment and Plan / ED Course  I have reviewed the triage vital signs and the nursing notes.  Pertinent labs & imaging results that were available during my care of the patient were reviewed by me and considered in my medical decision making (see chart for details).        MDM  78 year old female significant past medical history hypertension, hyperlipidemia, TAVR who presents with 2 months of intermittent chest pain, worse with exertion, pressure-like in nature, better with rest.  Patient denies any infectious-like symptoms, palpitation, syncope.  Patient otherwise resting comfortably on arrival, hemodynamically stable, chest pain-free.  EKG shows no signs of acute MI, no ST elevation or depression, baseline T wave inversion in V1 which was indicated on prior EKGs, appropriate intervals.  Initial laboratory studies indicate appropriate electrolytes, negative troponin.  Chest x-ray does not indicate no new, infectious-like etiology.  Patient is non-tachycardic, no lower peripheral swelling, PE unlikely.  Heart score is 5 which indicates moderate risk for ACS, patient had negative heart catheterization approximately 4 months ago. Based on continued chest pain with exertion, with concerning anginal equivalent.  Patient needs echo/cardiac evaluation as an inpatient to work-up  chest pain.  Patient in agreement with this plan.  Inpatient team in agreement with this plan, have no further recommendations here in the [facility] at this time. Patient will be admitted for further work-up and evaluation of unstable chest pain.  The above care was discussed and agreed upon by my attending physician.  Final Clinical Impressions(s) / ED Diagnoses   Final diagnoses:  Chest pain, unspecified type    ED Discharge  Orders    None       Orson Aloe, MD 06/08/18 Joesphine Bare    Noemi Chapel, MD 06/09/18 0040

## 2018-06-09 ENCOUNTER — Observation Stay (HOSPITAL_BASED_OUTPATIENT_CLINIC_OR_DEPARTMENT_OTHER): Payer: Medicare Other

## 2018-06-09 DIAGNOSIS — Z952 Presence of prosthetic heart valve: Secondary | ICD-10-CM

## 2018-06-09 DIAGNOSIS — I350 Nonrheumatic aortic (valve) stenosis: Secondary | ICD-10-CM

## 2018-06-09 DIAGNOSIS — K210 Gastro-esophageal reflux disease with esophagitis: Secondary | ICD-10-CM | POA: Diagnosis not present

## 2018-06-09 DIAGNOSIS — R072 Precordial pain: Secondary | ICD-10-CM | POA: Diagnosis not present

## 2018-06-09 DIAGNOSIS — I10 Essential (primary) hypertension: Secondary | ICD-10-CM

## 2018-06-09 DIAGNOSIS — R079 Chest pain, unspecified: Secondary | ICD-10-CM | POA: Diagnosis not present

## 2018-06-09 DIAGNOSIS — I351 Nonrheumatic aortic (valve) insufficiency: Secondary | ICD-10-CM | POA: Diagnosis not present

## 2018-06-09 DIAGNOSIS — E039 Hypothyroidism, unspecified: Secondary | ICD-10-CM

## 2018-06-09 DIAGNOSIS — R0789 Other chest pain: Secondary | ICD-10-CM | POA: Diagnosis not present

## 2018-06-09 DIAGNOSIS — K449 Diaphragmatic hernia without obstruction or gangrene: Secondary | ICD-10-CM | POA: Diagnosis not present

## 2018-06-09 LAB — ECHOCARDIOGRAM COMPLETE
Height: 60 in
Weight: 2464 oz

## 2018-06-09 LAB — TROPONIN I: Troponin I: <0.03 ng/mL (ref <0.03)

## 2018-06-09 MED ORDER — ALUM & MAG HYDROXIDE-SIMETH 200-200-20 MG/5ML PO SUSP
30.0000 mL | Freq: Once | ORAL | Status: AC
  Administered 2018-06-09: 30 mL via ORAL
  Filled 2018-06-09: qty 30

## 2018-06-09 NOTE — Plan of Care (Signed)

## 2018-06-09 NOTE — Progress Notes (Signed)
Pt discharge instructions reviewed with pt and family. Pt and family verbalize understanding. Pt belongings with pt. Pt is not in distress. Pt discharged via wheelchair. Pt's family is driving her home.

## 2018-06-09 NOTE — Discharge Summary (Addendum)
Shannon Mills, is a 78 y.o. female  DOB 10/30/1940  MRN [id number].  Admission date:  06/08/2018  Admitting Physician  Lavina Hamman, MD  Discharge Date:  06/09/2018   Primary MD  [name]., MD  Recommendations for primary care physician for things to follow:   -Last thyroid studies testing -Need repeat BMP  Discharge Diagnosis    Principal Problem:   Chest pain Active Problems:   Essential hypertension   Severe aortic stenosis   Hypothyroidism   Hypertension   Chronic reflux esophagitis   S/P TAVR (transcatheter aortic valve replacement)   Renal insufficiency      Past Medical History:  Diagnosis Date  . Anxiety   . Chronic reflux esophagitis   . Hx of colonic polyp   . Hypercholesterolemia   . Hypertension   . Hypothyroidism   . Interstitial cystitis   . Osteoporosis   . S/P TAVR (transcatheter aortic valve replacement)    23 mm Edwards Sapien 3 transcatheter heart valve placed via percutaneous right transfemoral approach   . Severe aortic stenosis   . Vitamin D deficiency     Past Surgical History:  Procedure Laterality Date  . CHOLECYSTECTOMY    . COLONOSCOPY  09/18/2011   Colonic polyp, status post polypectomy. Mild sigmoid diverticulosis. Otherwise grossly normal colonoscopy to terminal ileum   . FOOT SURGERY    . INTRAOPERATIVE TRANSTHORACIC ECHOCARDIOGRAM N/A 03/03/2018   Procedure: INTRAOPERATIVE TRANSTHORACIC ECHOCARDIOGRAM;  Surgeon: Burnell Blanks, MD;  Location: [facility];  Service: Open Heart Surgery;  Laterality: N/A;  . RIGHT/LEFT HEART CATH AND CORONARY ANGIOGRAPHY N/A 02/03/2018   Procedure: RIGHT/LEFT HEART CATH AND CORONARY ANGIOGRAPHY;  Surgeon: Martinique, Peter M, MD;  Location: [facility];  Service: Cardiovascular;  Laterality: N/A;  . TRANSCATHETER AORTIC VALVE  REPLACEMENT, TRANSFEMORAL  03/03/2018  . TRANSCATHETER AORTIC VALVE REPLACEMENT, TRANSFEMORAL N/A 03/03/2018   Procedure: TRANSCATHETER AORTIC VALVE REPLACEMENT, TRANSFEMORAL;  Surgeon: Burnell Blanks, MD;  Location: [facility];  Service: Open Heart Surgery;  Laterality: N/A;  . TUBAL LIGATION         HPI  from the history and physical done on the day of admission:     Shannon Mills is a 78 y.o. female with Past medical history of HTN, S/P TAVR, HLD, hypothyroid. Pt has chest pain ongoing since last 1-1/23-month primarily occurring on exertion associated with shortness of breath and getting better with rest. Pain is described as substernal across the chest feeling like heaviness and tightness. She actually reports that this is similar symptoms that she had before her aortic stenosis repair as well. No nausea no vomiting no fever no chills. She remains compliant with her aspirin and Plavix and denies any other recent change in her medications.  ED Course: chest pain resolved, no acute complains     Hospital Course:   1.  Chest pain, history of CAD: Patient presented with complaints of chest pain with activity relieved with rest.  Last cardiac catheterization in 01/2018 with  normal coronary arteries and echocardiogram post TAVR showing moderate aortic stenosis with increased gradient 03/2018.  No significant arrhythmias noted on telemetry.  Troponins remained negative and EKG did not show ischemic changes.  Chest x-ray showed no acute abnormalities.  Echocardiogram was ordered and noted EF greater than 65%, aortic valve mean gradient of 18 mmHg, and mild regurgitation similar to prior study.  Cardiology was consulted, but suspect that symptoms less likely cardiac in nature more likely related to possible acid reflux.  She was continued on aspirin and Plavix.  2.  GERD, hiatal hernia: Patient with recent barium swallow for which she was noted to have a mild hiatal hernia.  She is followed by  Dr. Lyndel Safe of gastroenterology in outpatient setting, but there is no plan for colonoscopy/EGD until July given need of antiplatelet therapy.  Patient was continued on PPI.  Discussed dietary modifications to try to help possibly decrease symptoms.  Recommended patient trying to obtain earlier follow-up with Dr. Lyndel Safe if needed.  3.  Anxiety: Patient has been tried on medications of Wellbutrin, but did not tolerate this medication well.  She was restarted on Lexapro 2 weeks ago.  4.  Hypothyroidism: Patient was continued on levothyroxine.  Will need outpatient TSH testing.  5.  Aortic stenosis s/p TVR in 02/2018  6.  Mild renal insufficiency: Admission creatinine 1.04.  Suspect related to decreased p.o. intake while in the emergency department.  Creatinine was not rechecked prior to discharge.  Follow-up recommended with primary care provider for monitoring in outpatient setting.  7.  Hyponatremia: Mild.  Admission sodium noted to be 134.  Follow UP     Consults obtained: [facility] cardiology  Discharge Condition: Stable   Diet and Activity recommendation: See Discharge Instructions below   Discharge Instructions    Discharge instructions   Complete by:  As directed    Symptoms were thought to be less likely related to your heart and more likely related to your hiatal hernia and acid reflux.  There have been no changes to your current medication regimen.  We advised for dietary modifications and information will be provided on discharge paperwork.  Please keep all previously scheduled follow-up appointments.  May warrant trying to make earlier follow-up with Dr. Lyndel Safe of gastroenterology if needed.        Discharge Medications     Allergies as of 06/09/2018      Reactions   Wellbutrin [bupropion] Nausea Only   EXTREME NAUSEA   Levaquin [levofloxacin] Nausea And Vomiting      Medication List    STOP taking these medications   pantoprazole 40 MG tablet Commonly known as:   PROTONIX     TAKE these medications   acetaminophen 500 MG tablet Commonly known as:  TYLENOL Take 500-1,000 mg by mouth every 8 (eight) hours as needed (for headaches).   amoxicillin 500 MG capsule Commonly known as:  AMOXIL Take 2,000 mg by mouth See admin instructions. Take 2,000 mg by mouth one hour prior to dental procedures What changed:  Another medication with the same name was removed. Continue taking this medication, and follow the directions you see here.   aspirin EC 81 MG tablet Take 81 mg by mouth daily.   cetirizine 10 MG tablet Commonly known as:  ZYRTEC Take 10 mg by mouth at bedtime.   clopidogrel 75 MG tablet Commonly known as:  PLAVIX Take 1 tablet (75 mg total) by mouth daily with breakfast.   escitalopram 10 MG tablet Commonly known  asLoma Sousa Take 10 mg by mouth at bedtime.   levothyroxine 50 MCG tablet Commonly known as:  SYNTHROID, LEVOTHROID Take 50 mcg by mouth daily before breakfast.   metoprolol succinate 25 MG 24 hr tablet Commonly known as:  TOPROL XL Take 1 tablet (25 mg total) by mouth daily. What changed:  when to take this   omeprazole 40 MG capsule Commonly known as:  PRILOSEC Take 1 capsule (40 mg total) by mouth daily. What changed:  when to take this   rosuvastatin 40 MG tablet Commonly known as:  CRESTOR Take 40 mg by mouth at bedtime.   Vitamin D-3 25 MCG (1000 UT) Caps Take 1,000 Units by mouth daily.       Major procedures and Radiology Reports - PLEASE review detailed and final reports for all details, in brief -    Dg Chest 2 View  Result Date: 06/08/2018 CLINICAL DATA:  Chest heaviness and shortness of breath. Recent TAVR. EXAM: CHEST - 2 VIEW COMPARISON:  03/03/2018 FINDINGS: Heart size is normal. TAVR in place, without change in orientation. Aortic atherosclerosis. The lungs are clear. The vascularity is normal. No effusions. No significant bone finding. IMPRESSION: Previous TAVR.  No active disease.  Electronically Signed   By: Nelson Chimes M.D.   On: 06/08/2018 13:09   Dg Esophagus W Double Cm (hd)  Result Date: 05/12/2018 CLINICAL DATA:  Esophageal dysphagia for 6 months. Choking on water. EXAM: ESOPHOGRAM / BARIUM SWALLOW / BARIUM TABLET STUDY TECHNIQUE: Combined double contrast and single contrast examination performed using effervescent crystals, thick barium liquid, and thin barium liquid. The patient was observed with fluoroscopy swallowing a 13 mm barium sulphate tablet. FLUOROSCOPY TIME:  Fluoroscopy Time:  3 minutes and 10 seconds. Radiation Exposure Index (if provided by the fluoroscopic device): 34.4 mGy Number of Acquired Spot Images: 0 COMPARISON:  Chest CT 02/17/2018 FINDINGS: Initial barium swallows demonstrate normal pharyngeal motion with swallowing. No laryngeal penetration or aspiration. No upper esophageal webs, strictures or diverticuli. Mildly prominent cricopharyngeal impression but no Zenker's diverticulum. Normal esophageal motility. No intrinsic or extrinsic lesions of the esophagus were identified. No mucosal abnormalities. There is a small sliding-type hiatal hernia and 1 episode of GE reflux. The 13 mm barium pill passed into the stomach without difficulty. IMPRESSION: 1. Prominent cricopharyngeal impression but no Zenker's diverticulum. 2. Normal esophageal motility. 3. Small sliding-type hiatal hernia and 1 episode of GE reflux. 4. No esophageal mass or stricture. Electronically Signed   By: Marijo Sanes M.D.   On: 05/12/2018 11:59    Micro Results     No results found for this or any previous visit (from the past 240 hour(s)).     Today   Subjective    Shannon Mills today states that she only gets the pain when she is up and moving like trying to cook.  Denies any difference in when she takes her acid reflux medication.   Objective   Blood pressure 120/68, pulse 60, temperature 97.7 F (36.5 C), temperature source Oral, resp. rate 17, height 5' (1.524  m), weight 69.9 kg, SpO2 100 %.  No intake or output data in the 24 hours ending 06/09/18 1312  Exam  Constitutional: Elderly female in NAD, calm, comfortable Eyes: PERRL, lids and conjunctivae normal ENMT: Mucous membranes are moist. Posterior pharynx clear of any exudate or lesions. Normal dentition.  Neck: normal, supple, no masses, no thyromegaly Respiratory: clear to auscultation bilaterally, no wheezing, no crackles. Normal respiratory effort. No accessory  muscle use.  Cardiovascular: Regular rate and rhythm, no murmurs / rubs / gallops. No extremity edema. 2+ pedal pulses. No carotid bruits.  Abdomen: no tenderness, no masses palpated. No hepatosplenomegaly. Bowel sounds positive.  Musculoskeletal: no clubbing / cyanosis. No joint deformity upper and lower extremities. Good ROM, no contractures. Normal muscle tone.  Skin: no rashes, lesions, ulcers. No induration Neurologic: CN 2-12 grossly intact. Sensation intact, DTR normal. Strength 5/5 in all 4.  Psychiatric: Normal judgment and insight. Alert and oriented x 3. Normal mood.    Data Review   CBC w Diff:  Lab Results  Component Value Date   WBC 4.9 06/08/2018   HGB 12.7 06/08/2018   HGB 14.0 01/29/2018   HCT 38.7 06/08/2018   HCT 41.7 01/29/2018   PLT 183 06/08/2018   PLT 184 01/29/2018    CMP:  Lab Results  Component Value Date   NA 134 (L) 06/08/2018   NA 139 01/29/2018   K 4.5 06/08/2018   CL 102 06/08/2018   CO2 23 06/08/2018   BUN 14 06/08/2018   BUN 21 01/29/2018   CREATININE 1.04 (H) 06/08/2018   PROT 6.8 02/27/2018   ALBUMIN 4.1 02/27/2018   BILITOT 0.6 02/27/2018   ALKPHOS 43 02/27/2018   AST 41 02/27/2018   ALT 42 02/27/2018  .   Total Time in preparing paper work, data evaluation and todays exam - 35 minutes  Norval Morton M.D on 06/09/2018 at 1:12 PM  [facility]   Office  [phone]

## 2018-06-09 NOTE — Consult Note (Signed)
Cardiology Consult    Patient ID: AMARAH BROSSMAN MRN: [id number], DOB/AGE: 78/17/1942   Admit date: 06/08/2018 Date of Consult: 06/09/2018  Primary Physician: [name]., MD Primary Cardiologist: No primary care provider on file.Revankar Requesting Provider: Fuller Plan, MD.  Patient Profile    KADYN CHOVAN is a 78 y.o. female with a history of normal coronary arteries on cardiac catheterization in 01/2018, severe aortic stenosis s/p TAVR in 02/2018, hypertension, hyperlipidemia, hypothyroidism, GERD, and anxiety, who is being seen today for the evaluation of chest pain at the request of Dr. Tamala Julian.  History of Present Illness    Ms. Domke is a 78 year old female with the above history who is followed by Dr. Geraldo Pitter. Right/left heart catheterization in 01/2018 showed normal coronary arteries with severe aortic stenosis with mean gradient of 33 mmHg and ABA of 0.8 cm^2. Patient underwent TAVR on 03/03/2018 by myself and Dr. Roxy Manns. Patient was last seen by Terrence Dupont, PA-C, on 04/01/2018 at which time she was doing well and denied any cardiac symptoms. Echo December 2019 with elevated gradient across the bioprosthetic AVR with mild PVL. This was unchanged from her one day post implant echo.   Patient presented to the [facility] ED yesterday for evaluation of chest pressure. She notes chest pain with heaviness across her chest. This happens after meals. There is associated dyspnea. Troponin negative x 3. EKG with normal sinus rhythm. She says she has a hiatal hernia and GERD and this feels like it could be related to that. She was concerned because it felt like her pain before her TAVR.   Past Medical History   Past Medical History:  Diagnosis Date  . Anxiety   . Chronic reflux esophagitis   . Hx of colonic polyp   . Hypercholesterolemia   . Hypertension   . Hypothyroidism   . Interstitial cystitis   . Osteoporosis   . S/P TAVR (transcatheter aortic valve replacement)    23 mm Edwards Sapien 3 transcatheter heart valve placed via percutaneous right transfemoral approach   . Severe aortic stenosis   . Vitamin D deficiency     Past Surgical History:  Procedure Laterality Date  . CHOLECYSTECTOMY    . COLONOSCOPY  09/18/2011   Colonic polyp, status post polypectomy. Mild sigmoid diverticulosis. Otherwise grossly normal colonoscopy to terminal ileum   . FOOT SURGERY    . INTRAOPERATIVE TRANSTHORACIC ECHOCARDIOGRAM N/A 03/03/2018   Procedure: INTRAOPERATIVE TRANSTHORACIC ECHOCARDIOGRAM;  Surgeon: Burnell Blanks, MD;  Location: [facility];  Service: Open Heart Surgery;  Laterality: N/A;  . RIGHT/LEFT HEART CATH AND CORONARY ANGIOGRAPHY N/A 02/03/2018   Procedure: RIGHT/LEFT HEART CATH AND CORONARY ANGIOGRAPHY;  Surgeon: Martinique, Peter M, MD;  Location: [place] [facility];  Service: Cardiovascular;  Laterality: N/A;  . TRANSCATHETER AORTIC VALVE REPLACEMENT, TRANSFEMORAL  03/03/2018  . TRANSCATHETER AORTIC VALVE REPLACEMENT, TRANSFEMORAL N/A 03/03/2018   Procedure: TRANSCATHETER AORTIC VALVE REPLACEMENT, TRANSFEMORAL;  Surgeon: Burnell Blanks, MD;  Location: [facility];  Service: Open Heart Surgery;  Laterality: N/A;  . TUBAL LIGATION       Allergies  Allergies  Allergen Reactions  . Wellbutrin [Bupropion] Nausea Only    EXTREME NAUSEA  . Levaquin [Levofloxacin] Nausea And Vomiting    Inpatient Medications    . aspirin EC  81 mg Oral Daily  . clopidogrel  75 mg Oral Q breakfast  . enoxaparin (LOVENOX) injection  40 mg Subcutaneous Q24H  . escitalopram  10 mg Oral QHS  .  levothyroxine  50 mcg Oral QAC breakfast  . loratadine  10 mg Oral Daily  . metoprolol succinate  25 mg Oral Daily  . pantoprazole  40 mg Oral Daily  . rosuvastatin  40 mg Oral QHS    Family History    Family History  Problem Relation Age of Onset  . Dementia Mother   . COPD Father   . Throat cancer Brother   . Colon cancer Neg Hx   . Esophageal cancer Neg Hx    She  indicated that her mother is deceased. She indicated that her father is deceased. She indicated that the status of her brother is unknown. She indicated that the status of her neg hx is unknown.   Social History    Social History   Socioeconomic History  . Marital status: Married    Spouse name: Not on file  . Number of children: 2  . Years of education: Not on file  . Highest education level: Not on file  Occupational History  . Occupation: Retired-Office Research scientist (medical)  . Financial resource strain: Not on file  . Food insecurity:    Worry: Not on file    Inability: Not on file  . Transportation needs:    Medical: Not on file    Non-medical: Not on file  Tobacco Use  . Smoking status: Never Smoker  . Smokeless tobacco: Never Used  Substance and Sexual Activity  . Alcohol use: No  . Drug use: No  . Sexual activity: Not on file  Lifestyle  . Physical activity:    Days per week: Not on file    Minutes per session: Not on file  . Stress: Not on file  Relationships  . Social connections:    Talks on phone: Not on file    Gets together: Not on file    Attends religious service: Not on file    Active member of club or organization: Not on file    Attends meetings of clubs or organizations: Not on file    Relationship status: Not on file  . Intimate partner violence:    Fear of current or ex partner: Not on file    Emotionally abused: Not on file    Physically abused: Not on file    Forced sexual activity: Not on file  Other Topics Concern  . Not on file  Social History Narrative  . Not on file     Review of Systems    ROS  Physical Exam    Blood pressure (!) 113/57, pulse (!) 55, temperature (!) 97.3 F (36.3 C), temperature source Oral, resp. rate 16, height 5' (1.524 m), weight 69.9 kg, SpO2 96 %.   General: 78 y.o. female resting comfortably in no acute distress. Pleasant and cooperative. HEENT: Normal  Neck: Supple. No bruits or JVD  appreciated. Lungs: No increased work of breathing. Clear to auscultation bilaterally. No wheezes, rhonchi, or rales. Heart: RRR. Distinct S1 and S2. Soft systolic murmur.   Abdomen: Soft, non-distended, and non-tender to palpation. Bowel sounds present in all 4 quadrants.   Extremities: No clubbing, cyanosis or edema. DP/PT/Radials 2+ and equal bilaterally. Neuro: Alert and oriented x3. No focal deficits. Moves all extremities spontaneously. Psych: Normal affect.   Labs    Troponin [facility] of Care Test) Recent Labs    06/08/18 1233  TROPIPOC 0.01   Recent Labs    06/08/18 1920 06/08/18 2153 06/09/18 0020  TROPONINI <0.03 <0.03 <0.03   Lab  Results  Component Value Date   WBC 4.9 06/08/2018   HGB 12.7 06/08/2018   HCT 38.7 06/08/2018   MCV 88.2 06/08/2018   PLT 183 06/08/2018    Recent Labs  Lab 06/08/18 1225  NA 134*  K 4.5  CL 102  CO2 23  BUN 14  CREATININE 1.04*  CALCIUM 9.0  GLUCOSE 83   No results found for: CHOL, HDL, LDLCALC, TRIG No results found for: [facility]   Radiology Studies    Dg Chest 2 View  Result Date: 06/08/2018 CLINICAL DATA:  Chest heaviness and shortness of breath. Recent TAVR. EXAM: CHEST - 2 VIEW COMPARISON:  03/03/2018 FINDINGS: Heart size is normal. TAVR in place, without change in orientation. Aortic atherosclerosis. The lungs are clear. The vascularity is normal. No effusions. No significant bone finding. IMPRESSION: Previous TAVR.  No active disease. Electronically Signed   By: Nelson Chimes M.D.   On: 06/08/2018 13:09   Dg Esophagus W Double Cm (hd)  Result Date: 05/12/2018 CLINICAL DATA:  Esophageal dysphagia for 6 months. Choking on water. EXAM: ESOPHOGRAM / BARIUM SWALLOW / BARIUM TABLET STUDY TECHNIQUE: Combined double contrast and single contrast examination performed using effervescent crystals, thick barium liquid, and thin barium liquid. The patient was observed with fluoroscopy swallowing a 13 mm barium sulphate tablet.  FLUOROSCOPY TIME:  Fluoroscopy Time:  3 minutes and 10 seconds. Radiation Exposure Index (if provided by the fluoroscopic device): 34.4 mGy Number of Acquired Spot Images: 0 COMPARISON:  Chest CT 02/17/2018 FINDINGS: Initial barium swallows demonstrate normal pharyngeal motion with swallowing. No laryngeal penetration or aspiration. No upper esophageal webs, strictures or diverticuli. Mildly prominent cricopharyngeal impression but no Zenker's diverticulum. Normal esophageal motility. No intrinsic or extrinsic lesions of the esophagus were identified. No mucosal abnormalities. There is a small sliding-type hiatal hernia and 1 episode of GE reflux. The 13 mm barium pill passed into the stomach without difficulty. IMPRESSION: 1. Prominent cricopharyngeal impression but no Zenker's diverticulum. 2. Normal esophageal motility. 3. Small sliding-type hiatal hernia and 1 episode of GE reflux. 4. No esophageal mass or stricture. Electronically Signed   By: Marijo Sanes M.D.   On: 05/12/2018 11:59    EKG     EKG: EKG was personally reviewed and demonstrates: NSR, no ischemic changes.  Telemetry: Telemetry was personally reviewed and demonstrates: sinus rhythm  Cardiac Imaging      Assessment & Plan    1. Atypical Chest pain 2. Severe aortic stenosis s/p TAVR November 2019. Echo today shows normally functioning bioprosthetic AVR with same gradient across the valve as echo immediately following the valve implant and in one month follow up in December 2019.   Her chest pain is not likely cardiac. Her LV function is normal. The aortic valve bioprosthesis is working well. She has no CAD by cath in November 2019. She is on ASA and Plavix. No further cardiac workup of chest pain.  Consider other causes of chest pain. Likely related to GERD.  Cardiology will sign off.  Please call with questions.    Jeanice Lim 06/09/2018 11:33 AM  For questions or updates, please contact   Please  consult [email or web address] for contact info under Cardiology/STEMI.

## 2018-06-09 NOTE — Progress Notes (Signed)
  Echocardiogram 2D Echocardiogram has been performed.  Jennette Dubin 06/09/2018, 10:03 AM

## 2018-06-10 ENCOUNTER — Telehealth: Payer: Self-pay | Admitting: Gastroenterology

## 2018-06-10 DIAGNOSIS — K219 Gastro-esophageal reflux disease without esophagitis: Secondary | ICD-10-CM

## 2018-06-10 DIAGNOSIS — N289 Disorder of kidney and ureter, unspecified: Secondary | ICD-10-CM

## 2018-06-10 DIAGNOSIS — E871 Hypo-osmolality and hyponatremia: Secondary | ICD-10-CM | POA: Diagnosis present

## 2018-06-10 HISTORY — DX: Hypo-osmolality and hyponatremia: E87.1

## 2018-06-10 HISTORY — DX: Disorder of kidney and ureter, unspecified: N28.9

## 2018-06-10 NOTE — Telephone Encounter (Signed)
Pt states that has a  hiatal hernia and is causing her to have pressure on her chest would like to know what to do.

## 2018-06-11 NOTE — Telephone Encounter (Signed)
Called and spoke with patient-patient reports since 06/08/2018 (Monday) she has been experiencing symptoms related to her hiatal hernia; went to ER at [facility] on Monday-was cleared by cardiology- cardiologist informed the patient "it is probably due to you hiatal hernia"; patient reports she is taking her Prilosec around 1pm everyday; patient reports she is going to be on the Plavix until May 12th; Patient reports she is not following any special diet-has changed diet to less greasy foods since symptoms started-continuous ache in between breasts-taking Pepcid in AM-medication is helping- Please advise any additional information/alternative medication;

## 2018-06-14 NOTE — Telephone Encounter (Signed)
Stay with same for now Pl call if still with problems  If she has, increase prilosec to 20mg  po bid, in addition

## 2018-06-15 MED ORDER — OMEPRAZOLE 20 MG PO CPDR: 20.0000 mg | DELAYED_RELEASE_CAPSULE | Freq: Two times a day (BID) | ORAL | 6 refills | Status: DC

## 2018-06-15 NOTE — Telephone Encounter (Signed)
Lets just try Prilosec 20 mg p.o. twice daily (without taking 40 mg).  Lets use the lowest effective dose. Please call in 60, 6 refills.

## 2018-06-15 NOTE — Telephone Encounter (Signed)
Patient returned call to office- patient reports she is doing "just fine" but wants to know what the MD recommends for her to do for her hiatal hernia; patient was informed of MD recommendations and is agreeable with plan of care; RX will be sent to pharmacy of patient choice-verified pharmacy; patient was advised to call back to the office should questions/concerns arise; patient verbalized understanding of information/instructions;   Dr. Lyndel Safe - do you want the patient to take the Prilosec 20 mg BID in addition to the Prilosec 40 mg she is currently taking at 1 pm if so quantity and refills? If not, do you want her to take the Prilosec 20 mg po BID without taking the 40 mg?  Quantity and refill information still needed, please Please advise

## 2018-06-15 NOTE — Telephone Encounter (Signed)
Left message for patient to call back  

## 2018-06-30 ENCOUNTER — Ambulatory Visit (INDEPENDENT_AMBULATORY_CARE_PROVIDER_SITE_OTHER): Payer: Medicare Other | Admitting: Cardiology

## 2018-06-30 ENCOUNTER — Encounter: Payer: Self-pay | Admitting: Cardiology

## 2018-06-30 VITALS — BP 118/62 | HR 61 | Ht 60.0 in | Wt 155.0 lb

## 2018-06-30 DIAGNOSIS — Z952 Presence of prosthetic heart valve: Secondary | ICD-10-CM

## 2018-06-30 DIAGNOSIS — E7800 Pure hypercholesterolemia, unspecified: Secondary | ICD-10-CM

## 2018-06-30 DIAGNOSIS — I10 Essential (primary) hypertension: Secondary | ICD-10-CM | POA: Diagnosis not present

## 2018-06-30 NOTE — Patient Instructions (Signed)
Medication Instructions:  Your physician recommends that you continue on your current medications as directed. Please refer to the Current Medication list given to you today.  If you need a refill on your cardiac medications before your next appointment, please call your pharmacy.   Lab work: None  If you have labs (blood work) drawn today and your tests are completely normal, you will receive your results only by: Marland Kitchen MyChart Message (if you have MyChart) OR . A paper copy in the mail If you have any lab test that is abnormal or we need to change your treatment, we will call you to review the results.  Testing/Procedures: None  Follow-Up: At [facility], you and your health needs are our priority.  As part of our continuing mission to provide you with exceptional heart care, we have created designated Provider Care Teams.  These Care Teams include your primary Cardiologist (physician) and Advanced Practice Providers (APPs -  Physician Assistants and Nurse Practitioners) who all work together to provide you with the care you need, when you need it. You will need a follow up appointment in 4 months.  Please call our office 2 months in advance to schedule this appointment.  You may see No primary care provider on file. or another member of our [employer] Provider Team in [place]: Jenne Campus, MD . [name], MD  Any Other Special Instructions Will Be Listed Below (If Applicable).

## 2018-06-30 NOTE — Progress Notes (Signed)
Cardiology Office Note:    Date:  06/30/2018   ID:  Shannon Mills, DOB Dec 02, 1940, MRN [id number]  PCP:  [name]., MD  Cardiologist:  Jenean Lindau, MD   Referring MD: [name]., MD    ASSESSMENT:    1. S/P TAVR (transcatheter aortic valve replacement)   2. Essential hypertension   3. Hypercholesterolemia    PLAN:    In order of problems listed above:  Secondary prevention discussed with the patient at length.  Importance of compliance with diet and medication stressed and she vocalized understanding.  Importance of regular exercise stressed. Her blood pressure is stable. Diet was discussed for dyslipidemia.  [facility] records were reviewed and I discussed them with the patient and answered questions to her satisfaction. Patient will be seen in follow-up appointment in 4 months or earlier if the patient has any concerns    Medication Adjustments/Labs and Tests Ordered: Current medicines are reviewed at length with the patient today.  Concerns regarding medicines are outlined above.  No orders of the defined types were placed in this encounter.  No orders of the defined types were placed in this encounter.    No chief complaint on file.    History of Present Illness:    Shannon Mills is a 78 y.o. female.  Patient has past medical history of severe aortic stenosis and undergone transcatheter aortic valve replacement.  She mentions to me that she went to [facility] with chest pain.  She was kept overnight and echocardiogram was done and this revealed the valve to be functioning satisfactorily and she was discharged.  Subsequent gastroenterology evaluation revealed that she has hiatal hernia for which she is receiving treatment and doing fine.  No chest pain orthopnea or PND.  She is ambulating on a daily basis.  Past Medical History:  Diagnosis Date  . Anxiety   . Chronic reflux esophagitis   . Hx of colonic polyp   .  Hypercholesterolemia   . Hypertension   . Hypothyroidism   . Interstitial cystitis   . Osteoporosis   . S/P TAVR (transcatheter aortic valve replacement)    23 mm Edwards Sapien 3 transcatheter heart valve placed via percutaneous right transfemoral approach   . Severe aortic stenosis   . Vitamin D deficiency     Past Surgical History:  Procedure Laterality Date  . CHOLECYSTECTOMY    . COLONOSCOPY  09/18/2011   Colonic polyp, status post polypectomy. Mild sigmoid diverticulosis. Otherwise grossly normal colonoscopy to terminal ileum   . FOOT SURGERY    . INTRAOPERATIVE TRANSTHORACIC ECHOCARDIOGRAM N/A 03/03/2018   Procedure: INTRAOPERATIVE TRANSTHORACIC ECHOCARDIOGRAM;  Surgeon: Burnell Blanks, MD;  Location: [place];  Service: Open Heart Surgery;  Laterality: N/A;  . RIGHT/LEFT HEART CATH AND CORONARY ANGIOGRAPHY N/A 02/03/2018   Procedure: RIGHT/LEFT HEART CATH AND CORONARY ANGIOGRAPHY;  Surgeon: Martinique, Peter M, MD;  Location: [place] CV LAB;  Service: Cardiovascular;  Laterality: N/A;  . TRANSCATHETER AORTIC VALVE REPLACEMENT, TRANSFEMORAL  03/03/2018  . TRANSCATHETER AORTIC VALVE REPLACEMENT, TRANSFEMORAL N/A 03/03/2018   Procedure: TRANSCATHETER AORTIC VALVE REPLACEMENT, TRANSFEMORAL;  Surgeon: Burnell Blanks, MD;  Location: [place];  Service: Open Heart Surgery;  Laterality: N/A;  . TUBAL LIGATION      Current Medications: Current Meds  Medication Sig  . acetaminophen (TYLENOL) 500 MG tablet Take 500-1,000 mg by mouth every 8 (eight) hours as needed (for headaches).   Marland Kitchen aspirin EC 81 MG tablet  Take 81 mg by mouth daily.  . cetirizine (ZYRTEC) 10 MG tablet Take 10 mg by mouth at bedtime.  . Cholecalciferol (VITAMIN D-3) 25 MCG (1000 UT) CAPS Take 1,000 Units by mouth daily.  . clopidogrel (PLAVIX) 75 MG tablet Take 1 tablet (75 mg total) by mouth daily with breakfast.  . escitalopram (LEXAPRO) 10 MG tablet Take 10 mg by mouth at bedtime.   Marland Kitchen levothyroxine  (SYNTHROID, LEVOTHROID) 50 MCG tablet Take 50 mcg by mouth daily before breakfast.  . metoprolol succinate (TOPROL XL) 25 MG 24 hr tablet Take 1 tablet (25 mg total) by mouth daily. (Patient taking differently: Take 25 mg by mouth every evening. )  . omeprazole (PRILOSEC) 20 MG capsule Take 1 capsule (20 mg total) by mouth 2 (two) times daily before a meal.  . rosuvastatin (CRESTOR) 40 MG tablet Take 40 mg by mouth at bedtime.      Allergies:   Wellbutrin [bupropion] and Levaquin [levofloxacin]   Social History   Socioeconomic History  . Marital status: Married    Spouse name: Not on file  . Number of children: 2  . Years of education: Not on file  . Highest education level: Not on file  Occupational History  . Occupation: Retired-Office Research scientist (medical)  . Financial resource strain: Not on file  . Food insecurity:    Worry: Not on file    Inability: Not on file  . Transportation needs:    Medical: Not on file    Non-medical: Not on file  Tobacco Use  . Smoking status: Never Smoker  . Smokeless tobacco: Never Used  Substance and Sexual Activity  . Alcohol use: No  . Drug use: No  . Sexual activity: Not on file  Lifestyle  . Physical activity:    Days per week: Not on file    Minutes per session: Not on file  . Stress: Not on file  Relationships  . Social connections:    Talks on phone: Not on file    Gets together: Not on file    Attends religious service: Not on file    Active member of club or organization: Not on file    Attends meetings of clubs or organizations: Not on file    Relationship status: Not on file  Other Topics Concern  . Not on file  Social History Narrative  . Not on file     Family History: The patient's family history includes COPD in her father; Dementia in her mother; Throat cancer in her brother. There is no history of Colon cancer or Esophageal cancer.  ROS:   Please see the history of present illness.    All other systems  reviewed and are negative.  EKGs/Labs/Other Studies Reviewed:    The following studies were reviewed today: I reviewed [facility] hospital records extensively.   Recent Labs: 02/27/2018: ALT 42; B Natriuretic Peptide 75.7 03/04/2018: Magnesium 2.0 06/08/2018: BUN 14; Creatinine, Ser 1.04; Hemoglobin 12.7; Platelets 183; Potassium 4.5; Sodium 134  Recent Lipid Panel No results found for: CHOL, TRIG, HDL, CHOLHDL, VLDL, LDLCALC, LDLDIRECT  Physical Exam:    VS:  BP 118/62 (BP Location: Right Arm, Patient Position: Sitting, Cuff Size: Normal)   Pulse 61   Ht 5' (1.524 m)   Wt 155 lb (70.3 kg)   SpO2 99%   BMI 30.27 kg/m     Wt Readings from Last 3 Encounters:  06/30/18 155 lb (70.3 kg)  06/08/18 154 lb (69.9 kg)  05/01/18 156 lb 6 oz (70.9 kg)     GEN: Patient is in no acute distress HEENT: Normal NECK: No JVD; No carotid bruits LYMPHATICS: No lymphadenopathy CARDIAC: Hear sounds regular, 2/6 systolic murmur at the apex. RESPIRATORY:  Clear to auscultation without rales, wheezing or rhonchi  ABDOMEN: Soft, non-tender, non-distended MUSCULOSKELETAL:  No edema; No deformity  SKIN: Warm and dry NEUROLOGIC:  Alert and oriented x 3 PSYCHIATRIC:  Normal affect   Signed, Jenean Lindau, MD  06/30/2018 9:55 AM    [facility]

## 2018-08-21 DIAGNOSIS — R300 Dysuria: Secondary | ICD-10-CM | POA: Diagnosis not present

## 2018-08-25 ENCOUNTER — Other Ambulatory Visit: Payer: Self-pay | Admitting: Physician Assistant

## 2018-08-27 ENCOUNTER — Encounter: Payer: Self-pay | Admitting: Gastroenterology

## 2018-08-28 ENCOUNTER — Telehealth (HOSPITAL_COMMUNITY): Payer: Self-pay

## 2018-08-28 ENCOUNTER — Other Ambulatory Visit (HOSPITAL_COMMUNITY): Payer: Self-pay | Admitting: Physician Assistant

## 2018-08-28 DIAGNOSIS — Z952 Presence of prosthetic heart valve: Secondary | ICD-10-CM

## 2018-08-28 NOTE — Telephone Encounter (Signed)

## 2018-08-31 ENCOUNTER — Ambulatory Visit (HOSPITAL_COMMUNITY): Payer: Medicare Other | Attending: Cardiology

## 2018-08-31 ENCOUNTER — Other Ambulatory Visit: Payer: Self-pay

## 2018-08-31 DIAGNOSIS — Z952 Presence of prosthetic heart valve: Secondary | ICD-10-CM

## 2018-09-22 ENCOUNTER — Telehealth (INDEPENDENT_AMBULATORY_CARE_PROVIDER_SITE_OTHER): Payer: Medicare Other | Admitting: Gastroenterology

## 2018-09-22 ENCOUNTER — Encounter: Payer: Self-pay | Admitting: Gastroenterology

## 2018-09-22 ENCOUNTER — Other Ambulatory Visit: Payer: Self-pay

## 2018-09-22 VITALS — Ht 62.5 in | Wt 150.0 lb

## 2018-09-22 DIAGNOSIS — K449 Diaphragmatic hernia without obstruction or gangrene: Secondary | ICD-10-CM

## 2018-09-22 DIAGNOSIS — R1310 Dysphagia, unspecified: Secondary | ICD-10-CM

## 2018-09-22 DIAGNOSIS — K219 Gastro-esophageal reflux disease without esophagitis: Secondary | ICD-10-CM | POA: Diagnosis not present

## 2018-09-22 DIAGNOSIS — Z860100 Personal history of colon polyps, unspecified: Secondary | ICD-10-CM

## 2018-09-22 MED ORDER — OMEPRAZOLE 40 MG PO CPDR: 40.0000 mg | DELAYED_RELEASE_CAPSULE | Freq: Two times a day (BID) | ORAL | 2 refills | Status: DC

## 2018-09-22 MED ORDER — OMEPRAZOLE 40 MG PO CPDR: 40.0000 mg | DELAYED_RELEASE_CAPSULE | Freq: Every day | ORAL | 3 refills | Status: DC

## 2018-09-22 NOTE — Patient Instructions (Addendum)
If you are age 78 or older, your body mass index should be between 23-30. Your Body mass index is 27 kg/m. If this is out of the aforementioned range listed, please consider follow up with your Primary Care Provider.  If you are age 87 or younger, your body mass index should be between 19-25. Your Body mass index is 27 kg/m. If this is out of the aformentioned range listed, please consider follow up with your Primary Care Provider.   To help prevent the possible spread of infection to our patients, communities, and staff; we will be implementing the following measures:  As of now we are not allowing any visitors/family members to accompany you to any upcoming appointments with [facility]. If you have any concerns about this please contact our office to discuss prior to the appointment.   You have been scheduled for an endoscopy and colonoscopy. Please follow the written instructions given to you at your visit today. Please pick up your prep supplies at the pharmacy within the next 1-3 days. If you use inhalers (even only as needed), please bring them with you on the day of your procedure. Your physician has requested that you go to www.startemmi.com and enter the access code given to you at your visit today. This web site gives a general overview about your procedure. However, you should still follow specific instructions given to you by our office regarding your preparation for the procedure.   We have sent the following medications to your pharmacy for you to pick up at your convenience: Omeprazole 40mg  by mouth twice daily.  Please purchase the following medications over the counter and take as directed: Miralax  Thank you,  Dr. Jackquline Denmark

## 2018-09-22 NOTE — Addendum Note (Signed)
Addended by: Herma Mering D on: 09/22/2018 02:15 PM   Modules accepted: Orders

## 2018-09-22 NOTE — Progress Notes (Signed)
Chief Complaint: Dysphagia  Referring Provider:  [name]., MD      ASSESSMENT AND PLAN;   #1.  GERD with small HH on Ba swallow. H/O Esophageal dysphagia (mostly with liquids).  H/O noncardiac chest pains.  #2. Severe AS s/p TAVR 03/03/2018 off plavix 08/2018. Followed by Dr Geraldo Pitter. 2DE- Nl, normally functioning TAVR. Doing quite well clinically. OK by cardiology to undergo GI procedures.  #3.  H/O tubular adenomas 08/2011 (was due for colonoscopy 08/2014).  Plan: - Increase Omprazole 40mg  po Bid (#60, 2 refills) until GI procedures. - EGD with dilatation/colonoscopy with miralax in 2-3 weeks at [facility]. Discussed risks & benefits. (Risks including rare perforation req laparotomy, bleeding after biopsies/polypectomy req blood transfusion, rare chance of missing neoplasms, risks of anesthesia/sedation). Benefits outweigh the risks. Patient agrees to proceed. All the questions were answered.  - Keep on taking ASA throughout.   HPI:    Shannon Mills is a 78 y.o. female  With intermittent dysphagia mostly to liquids, mid chest, ever since she has TAVR (Had TEE at that time).  Not really bad per patient.  Still wanted to make sure that there is no problems.  She denies having any significant problems with solids. Ba swallow - small HH. Doing very well from the cardiac standpoint. Has cough. No heartburn since being on omeprazole. NO shortness of breath. No nausea, vomiting, regurgitation, odynophagia.  No significant diarrhea or constipation.  There is no melena or hematochezia. No unintentional weight loss.  No abdominal pain.  Had episode of chest pains in February-noncardiac admitted to [facility]  Cardiac work-up was negative.   History of colonic polyps-colonoscopy 08/2011 ([facility])-1 cm mid descending colon polyp status post polypectomy.  Biopsies tubular adenoma.  Mild sigmoid diverticulosis.  Was told to repeat in 3 years.  Patient did get letters.  Husband- Shannon Mills is pt is  ours. Past Medical History:  Diagnosis Date  . Anxiety   . Chronic reflux esophagitis   . Hiatal hernia   . Hx of colonic polyp   . Hypercholesterolemia   . Hypertension   . Hypothyroidism   . Interstitial cystitis   . Osteoporosis   . S/P TAVR (transcatheter aortic valve replacement)    23 mm Edwards Sapien 3 transcatheter heart valve placed via percutaneous right transfemoral approach   . Severe aortic stenosis   . Vitamin D deficiency     Past Surgical History:  Procedure Laterality Date  . CHOLECYSTECTOMY    . COLONOSCOPY  09/18/2011   Colonic polyp, status post polypectomy. Mild sigmoid diverticulosis. Otherwise grossly normal colonoscopy to terminal ileum   . FOOT SURGERY    . INTRAOPERATIVE TRANSTHORACIC ECHOCARDIOGRAM N/A 03/03/2018   Procedure: INTRAOPERATIVE TRANSTHORACIC ECHOCARDIOGRAM;  Surgeon: Burnell Blanks, MD;  Location: [place];  Service: Open Heart Surgery;  Laterality: N/A;  . RIGHT/LEFT HEART CATH AND CORONARY ANGIOGRAPHY N/A 02/03/2018   Procedure: RIGHT/LEFT HEART CATH AND CORONARY ANGIOGRAPHY;  Surgeon: Martinique, Peter M, MD;  Location: [place] CV LAB;  Service: Cardiovascular;  Laterality: N/A;  . TRANSCATHETER AORTIC VALVE REPLACEMENT, TRANSFEMORAL  03/03/2018  . TRANSCATHETER AORTIC VALVE REPLACEMENT, TRANSFEMORAL N/A 03/03/2018   Procedure: TRANSCATHETER AORTIC VALVE REPLACEMENT, TRANSFEMORAL;  Surgeon: Burnell Blanks, MD;  Location: [place];  Service: Open Heart Surgery;  Laterality: N/A;  . TUBAL LIGATION      Family History  Problem Relation Age of Onset  . Dementia Mother   . COPD Father   . Throat cancer Brother   .  Colon cancer Neg Hx   . Esophageal cancer Neg Hx     Social History   Tobacco Use  . Smoking status: Never Smoker  . Smokeless tobacco: Never Used  Substance Use Topics  . Alcohol use: No  . Drug use: No    Current Outpatient Medications  Medication Sig Dispense Refill  . acetaminophen (TYLENOL) 500 MG  tablet Take 500-1,000 mg by mouth every 8 (eight) hours as needed (for headaches).     Marland Kitchen aspirin EC 81 MG tablet Take 81 mg by mouth daily.    . cetirizine (ZYRTEC) 10 MG tablet Take 10 mg by mouth at bedtime.    . Cholecalciferol (VITAMIN D-3) 25 MCG (1000 UT) CAPS Take 1,000 Units by mouth daily.    Marland Kitchen escitalopram (LEXAPRO) 10 MG tablet Take 10 mg by mouth at bedtime.     Marland Kitchen levothyroxine (SYNTHROID, LEVOTHROID) 50 MCG tablet Take 50 mcg by mouth daily before breakfast.    . metoprolol succinate (TOPROL XL) 25 MG 24 hr tablet Take 1 tablet (25 mg total) by mouth daily. (Patient taking differently: Take 25 mg by mouth every evening. ) 90 tablet 3  . omeprazole (PRILOSEC) 40 MG capsule Take 40 mg by mouth daily.    . rosuvastatin (CRESTOR) 40 MG tablet Take 40 mg by mouth at bedtime.      No current facility-administered medications for this visit.     Allergies  Allergen Reactions  . Wellbutrin [Bupropion] Nausea Only    EXTREME NAUSEA  . Levaquin [Levofloxacin] Nausea And Vomiting    Review of Systems:  neg     Physical Exam:    Ht 5' 2.5" (1.588 m)   Wt 150 lb (68 kg)   BMI 27.00 kg/m  Filed Weights   09/22/18 0935  Weight: 150 lb (68 kg)   Not examined -   Data Reviewed: I have personally reviewed following labs and imaging studies  CBC: CBC Latest Ref Rng & Units 06/08/2018 03/04/2018 03/03/2018  WBC 4.0 - 10.5 K/uL 4.9 4.8 -  Hemoglobin 12.0 - 15.0 g/dL 12.7 11.3(L) 11.2(L)  Hematocrit 36.0 - 46.0 % 38.7 35.4(L) 33.0(L)  Platelets 150 - 400 K/uL 183 112(L) -    CMP: CMP Latest Ref Rng & Units 06/08/2018 03/04/2018 03/03/2018  Glucose 70 - 99 mg/dL 83 99 117(H)  BUN 8 - 23 mg/dL 14 12 17   Creatinine 0.44 - 1.00 mg/dL 1.04(H) 0.91 0.90  Sodium 135 - 145 mmol/L 134(L) 139 141  Potassium 3.5 - 5.1 mmol/L 4.5 3.7 3.8  Chloride 98 - 111 mmol/L 102 110 105  CO2 22 - 32 mmol/L 23 26 -  Calcium 8.9 - 10.3 mg/dL 9.0 8.6(L) -  Total Protein 6.5 - 8.1 g/dL - - -   Total Bilirubin 0.3 - 1.2 mg/dL - - -  Alkaline Phos 38 - 126 U/L - - -  AST 15 - 41 U/L - - -  ALT 0 - 44 U/L - - -   This service was provided via telemedicine.  The patient was located at home.  The provider was located in office.  The patient did consent to this telephone visit and is aware of possible charges through their insurance for this visit.   Time spent on call/coordination of care: 25 min    Carmell Austria, MD 09/22/2018, 1:23 PM  Cc: [name]., MD

## 2018-09-25 ENCOUNTER — Encounter: Payer: Medicare Other | Admitting: Gastroenterology

## 2018-10-15 DIAGNOSIS — Z9181 History of falling: Secondary | ICD-10-CM | POA: Diagnosis not present

## 2018-10-15 DIAGNOSIS — Z1331 Encounter for screening for depression: Secondary | ICD-10-CM | POA: Diagnosis not present

## 2018-10-15 DIAGNOSIS — E785 Hyperlipidemia, unspecified: Secondary | ICD-10-CM | POA: Diagnosis not present

## 2018-10-15 DIAGNOSIS — I10 Essential (primary) hypertension: Secondary | ICD-10-CM | POA: Diagnosis not present

## 2018-10-15 DIAGNOSIS — K210 Gastro-esophageal reflux disease with esophagitis: Secondary | ICD-10-CM | POA: Diagnosis not present

## 2018-10-15 DIAGNOSIS — F419 Anxiety disorder, unspecified: Secondary | ICD-10-CM | POA: Diagnosis not present

## 2018-10-16 ENCOUNTER — Telehealth: Payer: Self-pay | Admitting: Gastroenterology

## 2018-10-16 DIAGNOSIS — E063 Autoimmune thyroiditis: Secondary | ICD-10-CM | POA: Diagnosis not present

## 2018-10-16 DIAGNOSIS — E785 Hyperlipidemia, unspecified: Secondary | ICD-10-CM | POA: Diagnosis not present

## 2018-10-16 NOTE — Telephone Encounter (Signed)
Pt is scheduled for EGD/col and stated that she was taken off of Plavix but bruises easily.  Pt would like to know if procedures will be appropriate.

## 2018-10-16 NOTE — Telephone Encounter (Signed)
Pt states she bruises on the outside very easily and wanted to know if she will bruise on the inside. Discussed with her that she is off of the plavix and that she should be fine.

## 2018-10-19 ENCOUNTER — Encounter: Payer: Self-pay | Admitting: Cardiology

## 2018-10-26 ENCOUNTER — Telehealth: Payer: Self-pay | Admitting: Gastroenterology

## 2018-10-26 NOTE — Telephone Encounter (Signed)

## 2018-10-27 ENCOUNTER — Ambulatory Visit (AMBULATORY_SURGERY_CENTER): Payer: Medicare Other | Admitting: Gastroenterology

## 2018-10-27 ENCOUNTER — Encounter: Payer: Self-pay | Admitting: Gastroenterology

## 2018-10-27 ENCOUNTER — Other Ambulatory Visit: Payer: Self-pay

## 2018-10-27 VITALS — BP 128/62 | HR 59 | Temp 98.1°F | Resp 17 | Ht 62.0 in | Wt 150.0 lb

## 2018-10-27 DIAGNOSIS — D123 Benign neoplasm of transverse colon: Secondary | ICD-10-CM

## 2018-10-27 DIAGNOSIS — K219 Gastro-esophageal reflux disease without esophagitis: Secondary | ICD-10-CM | POA: Diagnosis not present

## 2018-10-27 DIAGNOSIS — Z86010 Personal history of colonic polyps: Secondary | ICD-10-CM

## 2018-10-27 DIAGNOSIS — D120 Benign neoplasm of cecum: Secondary | ICD-10-CM | POA: Diagnosis not present

## 2018-10-27 DIAGNOSIS — K449 Diaphragmatic hernia without obstruction or gangrene: Secondary | ICD-10-CM

## 2018-10-27 DIAGNOSIS — D122 Benign neoplasm of ascending colon: Secondary | ICD-10-CM | POA: Diagnosis not present

## 2018-10-27 DIAGNOSIS — K3189 Other diseases of stomach and duodenum: Secondary | ICD-10-CM

## 2018-10-27 DIAGNOSIS — R1310 Dysphagia, unspecified: Secondary | ICD-10-CM | POA: Diagnosis not present

## 2018-10-27 DIAGNOSIS — K2970 Gastritis, unspecified, without bleeding: Secondary | ICD-10-CM | POA: Diagnosis not present

## 2018-10-27 DIAGNOSIS — K222 Esophageal obstruction: Secondary | ICD-10-CM | POA: Diagnosis not present

## 2018-10-27 DIAGNOSIS — I10 Essential (primary) hypertension: Secondary | ICD-10-CM | POA: Diagnosis not present

## 2018-10-27 DIAGNOSIS — R1319 Other dysphagia: Secondary | ICD-10-CM

## 2018-10-27 MED ORDER — SODIUM CHLORIDE 0.9 % IV SOLN: 500.0000 mL | Freq: Once | INTRAVENOUS | Status: DC

## 2018-10-27 NOTE — Op Note (Signed)
[place] Patient Name: Shannon Mills Procedure Date: 10/27/2018 9:39 AM MRN: [id number] Endoscopist: Jackquline Denmark , MD Age: 78 Referring MD:  Date of Birth: Dec 12, 1940 Gender: Female Account #: [id number] Procedure:                Colonoscopy Indications:              High risk colon cancer surveillance: Personal                            history of colonic polyps 2013 Medicines:                Monitored Anesthesia Care Procedure:                Pre-Anesthesia Assessment:                           - Prior to the procedure, a History and Physical                            was performed, and patient medications and                            allergies were reviewed. The patient's tolerance of                            previous anesthesia was also reviewed. The risks                            and benefits of the procedure and the sedation                            options and risks were discussed with the patient.                            All questions were answered, and informed consent                            was obtained. Prior Anticoagulants: The patient has                            taken no previous anticoagulant or antiplatelet                            agents except for aspirin. ASA Grade Assessment:                            III - A patient with severe systemic disease. After                            reviewing the risks and benefits, the patient was                            deemed in satisfactory condition to undergo the  procedure.                           After obtaining informed consent, the colonoscope                            was passed under direct vision. Throughout the                            procedure, the patient's blood pressure, pulse, and                            oxygen saturations were monitored continuously. The                            Colonoscope was introduced through the anus and              advanced to the the cecum, identified by                            appendiceal orifice and ileocecal valve. The                            colonoscopy was performed without difficulty. The                            patient tolerated the procedure well. The quality                            of the bowel preparation was good. The ileocecal                            valve, appendiceal orifice, and rectum were                            photographed. Scope In: 9:54:09 AM Scope Out: 10:16:46 AM Scope Withdrawal Time: 0 hours 17 minutes 19 seconds  Total Procedure Duration: 0 hours 22 minutes 37 seconds  Findings:                 A 30 mm sessile polyp was found in the proximal                            ascending colon with frond-like surface, 1 fold                            above the ileocecal valve. Also examined by NBI. It                            involves 2 folds, 1/4 of the circumference. Few                            biopsies were taken with a cold forceps for  histology. Estimated blood loss: none.                           Three sessile polyps were found in the cecum. The                            polyps were 4 to 6 mm in size. These polyps were                            removed with a cold snare. Resection and retrieval                            were complete. Estimated blood loss: none. There                            were few small 2 to 4 mm polyps in the ascending                            colon were not removed.                           Two sessile polyps were found in the proximal                            transverse colon. The polyps were 10 mm in size.                            These polyps were removed with a hot snare.                            Resection and retrieval were complete.                           A few small-mouthed diverticula were found in the                            sigmoid colon.                            Non-bleeding internal hemorrhoids were found during                            retroflexion and during perianal exam. The                            hemorrhoids were small. Complications:            No immediate complications. Estimated Blood Loss:     Estimated blood loss: none. Impression:               - Large flat proximal ascending colon. Biopsied.                           - Colonic polyps status post polypectomy.                           -  Mild sigmoid diverticulosis. Recommendation:           - Patient has a contact number available for                            emergencies. The signs and symptoms of potential                            delayed complications were discussed with the                            patient. Return to normal activities tomorrow.                            Written discharge instructions were provided to the                            patient.                           - Resume previous diet.                           - Continue present medications.                           - Await pathology results.                           - Repeat colonoscopy for surveillance based on                            pathology results. She would likely require EMR by                            Dr. Rush Landmark. Will discuss with him once the                            biopsy results are back.                           - Return to GI clinic in 4 weeks. Jackquline Denmark, MD 10/27/2018 10:36:34 AM This report has been signed electronically.

## 2018-10-27 NOTE — Progress Notes (Signed)
Report to PACU, RN, vss, BBS= Clear.  

## 2018-10-27 NOTE — Op Note (Signed)
[facility] Patient Name: Shannon Mills Procedure Date: 10/27/2018 9:40 AM MRN: [id number] Endoscopist: Jackquline Denmark , MD Age: 78 Referring MD:  Date of Birth: [birth year] Gender: Female Account #: [id number] Procedure:                Upper GI endoscopy Indications:              Dysphagia Medicines:                Monitored Anesthesia Care Procedure:                Pre-Anesthesia Assessment:                           - Prior to the procedure, a History and Physical                            was performed, and patient medications and                            allergies were reviewed. The patient's tolerance of                            previous anesthesia was also reviewed. The risks                            and benefits of the procedure and the sedation                            options and risks were discussed with the patient.                            All questions were answered, and informed consent                            was obtained. Prior Anticoagulants: The patient has                            taken no previous anticoagulant or antiplatelet                            agents except for aspirin. ASA Grade Assessment:                            III - A patient with severe systemic disease. After                            reviewing the risks and benefits, the patient was                            deemed in satisfactory condition to undergo the                            procedure.  After obtaining informed consent, the endoscope was                            passed under direct vision. Throughout the                            procedure, the patient's blood pressure, pulse, and                            oxygen saturations were monitored continuously. The                            Model GIF-HQ190 [id number]) scope was introduced                            through the mouth, and advanced to the second part     of duodenum. The upper GI endoscopy was                            accomplished without difficulty. The patient                            tolerated the procedure well. Scope In: Scope Out: Findings:                 A mild Schatzki ring was found at the                            gastroesophageal junction. The scope was withdrawn.                            Dilation was performed with a Maloney dilator with                            mild resistance at 48 Fr and 50 Fr. Estimated blood                            loss: none.                           A 2 cm hiatal hernia was present.                           Localized mild inflammation characterized by                            erythema was found in the gastric antrum. Biopsies                            were taken with a cold forceps for histology.                            Estimated blood loss: none.  The examined duodenum was normal. Complications:            No immediate complications. Estimated Blood Loss:     Estimated blood loss: none. Impression:               - Schatzki ring s/p esophageal dilatation                           - 2 cm hiatal hernia.                           - Mild gastritis. Recommendation:           - Patient has a contact number available for                            emergencies. The signs and symptoms of potential                            delayed complications were discussed with the                            patient. Return to normal activities tomorrow.                            Written discharge instructions were provided to the                            patient.                           - Post dilatation diet.                           - Continue omeprazole 20 mg p.o. twice daily.                           - Await pathology results.                           - Return to GI clinic in 12 weeks. Jackquline Denmark, MD 10/27/2018 10:23:37 AM This report has been signed  electronically.

## 2018-10-27 NOTE — Patient Instructions (Signed)
Handouts given for esophageal stricture and Post esophageal dilation diet.  Handouts also given for polyps, diverticulosis and hemorrhoids.  Continue Omeprazole 20 mg twice a day.  Await pathology results to determine next step for polyp removal.  YOU HAD AN ENDOSCOPIC PROCEDURE TODAY AT [facility]:   Refer to the procedure report that was given to you for any specific questions about what was found during the examination.  If the procedure report does not answer your questions, please call your gastroenterologist to clarify.  If you requested that your care partner not be given the details of your procedure findings, then the procedure report has been included in a sealed envelope for you to review at your convenience later.  YOU SHOULD EXPECT: Some feelings of bloating in the abdomen. Passage of more gas than usual.  Walking can help get rid of the air that was put into your GI tract during the procedure and reduce the bloating. If you had a lower endoscopy (such as a colonoscopy or flexible sigmoidoscopy) you may notice spotting of blood in your stool or on the toilet paper. If you underwent a bowel prep for your procedure, you may not have a normal bowel movement for a few days.  Please Note:  You might notice some irritation and congestion in your nose or some drainage.  This is from the oxygen used during your procedure.  There is no need for concern and it should clear up in a day or so.  SYMPTOMS TO REPORT IMMEDIATELY:   Following lower endoscopy (colonoscopy or flexible sigmoidoscopy):  Excessive amounts of blood in the stool  Significant tenderness or worsening of abdominal pains  Swelling of the abdomen that is new, acute  Fever of 100F or higher   Following upper endoscopy (EGD)  Vomiting of blood or coffee ground material  New chest pain or pain under the shoulder blades  Painful or persistently difficult swallowing  New shortness of breath  Fever of 100F  or higher  Black, tarry-looking stools  For urgent or emergent issues, a gastroenterologist can be reached at any hour by calling [phone].   DIET:  We do recommend a small meal at first, but then you may proceed to your regular diet.  Drink plenty of fluids but you should avoid alcoholic beverages for 24 hours.  ACTIVITY:  You should plan to take it easy for the rest of today and you should NOT DRIVE or use heavy machinery until tomorrow (because of the sedation medicines used during the test).    FOLLOW UP: Our staff will call the number listed on your records 48-72 hours following your procedure to check on you and address any questions or concerns that you may have regarding the information given to you following your procedure. If we do not reach you, we will leave a message.  We will attempt to reach you two times.  During this call, we will ask if you have developed any symptoms of COVID 19. If you develop any symptoms (ie: fever, flu-like symptoms, shortness of breath, cough etc.) before then, please call [phone].  If you test positive for Covid 19 in the 2 weeks post procedure, please call and report this information to [place].    If any biopsies were taken you will be contacted by phone or by letter within the next 1-3 weeks.  Please call us at [phone] if you have not heard about the biopsies in 3 weeks.    SIGNATURES/CONFIDENTIALITY:  You and/or your care partner have signed paperwork which will be entered into your electronic medical record.  These signatures attest to the fact that that the information above on your After Visit Summary has been reviewed and is understood.  Full responsibility of the confidentiality of this discharge information lies with you and/or your care-partner.

## 2018-10-27 NOTE — Progress Notes (Signed)
Called to room to assist during endoscopic procedure.  Patient ID and intended procedure confirmed with present staff. Received instructions for my participation in the procedure from the performing physician.  

## 2018-10-29 ENCOUNTER — Telehealth: Payer: Self-pay

## 2018-10-29 NOTE — Telephone Encounter (Signed)
Called patient to reschedule Ash appt

## 2018-10-29 NOTE — Telephone Encounter (Signed)
  Follow up Call-  Call back number 10/27/2018  Post procedure Call Back phone  # [phone]  Permission to leave phone message Yes  Some recent data might be hidden     Patient questions:  Do you have a fever, pain , or abdominal swelling? No. Pain Score  0 *  Have you tolerated food without any problems? Yes.    Have you been able to return to your normal activities? Yes.    Do you have any questions about your discharge instructions: Diet   No. Medications  No. Follow up visit  No.  Do you have questions or concerns about your Care? No.  Actions: * If pain score is 4 or above: 1. No action needed, pain <4.Have you developed a fever since your procedure? no  2.   Have you had an respiratory symptoms (SOB or cough) since your procedure? no  3.   Have you tested positive for COVID 19 since your procedure no  4.   Have you had any family members/close contacts diagnosed with the COVID 19 since your procedure?  no   If yes to any of these questions please route to Joylene John, RN and Alphonsa Gin, Therapist, sports.

## 2018-11-02 ENCOUNTER — Ambulatory Visit: Payer: Medicare Other | Admitting: Cardiology

## 2018-11-10 ENCOUNTER — Telehealth: Payer: Self-pay | Admitting: Gastroenterology

## 2018-11-10 MED ORDER — AMBULATORY NON FORMULARY MEDICATION: 0 refills | Status: DC

## 2018-11-10 NOTE — Telephone Encounter (Signed)
I have called patient and let her know thatwe sent her prescription to her pharmacy and to let us know how she is by the end of the week. Patient says she has no problems swallowing.

## 2018-11-10 NOTE — Telephone Encounter (Signed)
Please advise 

## 2018-11-10 NOTE — Telephone Encounter (Signed)
Lets try GI cocktail 5 to 10 cc every 6-8 hours as needed (equal amounts of Maalox, viscous lidocaine and liquid Bentyl or liquid Donnatal). Let us know how she is by the end of the week. Also please ask if there is any problems swallowing  RG

## 2018-11-12 ENCOUNTER — Ambulatory Visit (INDEPENDENT_AMBULATORY_CARE_PROVIDER_SITE_OTHER): Payer: Medicare Other | Admitting: Cardiology

## 2018-11-12 ENCOUNTER — Encounter: Payer: Self-pay | Admitting: Cardiology

## 2018-11-12 ENCOUNTER — Other Ambulatory Visit: Payer: Self-pay

## 2018-11-12 ENCOUNTER — Telehealth: Payer: Self-pay

## 2018-11-12 VITALS — BP 112/58 | HR 57 | Ht 62.0 in | Wt 143.0 lb

## 2018-11-12 DIAGNOSIS — E7800 Pure hypercholesterolemia, unspecified: Secondary | ICD-10-CM

## 2018-11-12 DIAGNOSIS — I10 Essential (primary) hypertension: Secondary | ICD-10-CM

## 2018-11-12 NOTE — Progress Notes (Signed)
Cardiology Office Note:    Date:  11/12/2018   ID:  Shannon Mills, DOB Aug 29, 1940, MRN [id number]  PCP:  [name]., MD  Cardiologist:  Jenean Lindau, MD   Referring MD: [name]., MD    ASSESSMENT:    No diagnosis found. PLAN:    In order of problems listed above:  1. Status post aortic valve replacement: Patient is stable from this perspective.  She takes aspirin on a regular basis. 2. Essential hypertension: Blood pressure is stable. 3. Mixed dyslipidemia: Lipids were reviewed and primary care recently did her blood work and it is fine.  Diet was discussed 4. Patient will be seen in follow-up appointment in 6 months or earlier if the patient has any concerns    Medication Adjustments/Labs and Tests Ordered: Current medicines are reviewed at length with the patient today.  Concerns regarding medicines are outlined above.  No orders of the defined types were placed in this encounter.  No orders of the defined types were placed in this encounter.    No chief complaint on file.    History of Present Illness:    Shannon Mills is a 78 y.o. female.  Patient has past medical history of aortic valve replacement.  Echocardiogram report is mentioned below.  She went to [facility] several months ago and was evaluated and cardiovascular status was found to be stable and discharged.  Since then she is doing fine.  No chest pain orthopnea or PND.  At the time of my evaluation, the patient is alert awake oriented and in no distress.  Past Medical History:  Diagnosis Date   Anxiety    Chronic reflux esophagitis    Hiatal hernia    Hx of colonic polyp    Hypercholesterolemia    Hypertension    Hypothyroidism    Interstitial cystitis    Osteoporosis    S/P TAVR (transcatheter aortic valve replacement)    23 mm Edwards Sapien 3 transcatheter heart valve placed via percutaneous right transfemoral approach    Severe aortic stenosis    Vitamin D  deficiency     Past Surgical History:  Procedure Laterality Date   CHOLECYSTECTOMY     COLONOSCOPY  09/18/2011   Colonic polyp, status post polypectomy. Mild sigmoid diverticulosis. Otherwise grossly normal colonoscopy to terminal ileum    FOOT SURGERY     INTRAOPERATIVE TRANSTHORACIC ECHOCARDIOGRAM N/A 03/03/2018   Procedure: INTRAOPERATIVE TRANSTHORACIC ECHOCARDIOGRAM;  Surgeon: Burnell Blanks, MD;  Location: [place];  Service: Open Heart Surgery;  Laterality: N/A;   RIGHT/LEFT HEART CATH AND CORONARY ANGIOGRAPHY N/A 02/03/2018   Procedure: RIGHT/LEFT HEART CATH AND CORONARY ANGIOGRAPHY;  Surgeon: Martinique, Peter M, MD;  Location: [place] [facility];  Service: Cardiovascular;  Laterality: N/A;   TRANSCATHETER AORTIC VALVE REPLACEMENT, TRANSFEMORAL  03/03/2018   TRANSCATHETER AORTIC VALVE REPLACEMENT, TRANSFEMORAL N/A 03/03/2018   Procedure: TRANSCATHETER AORTIC VALVE REPLACEMENT, TRANSFEMORAL;  Surgeon: Burnell Blanks, MD;  Location: [place];  Service: Open Heart Surgery;  Laterality: N/A;   TUBAL LIGATION      Current Medications: Current Meds  Medication Sig   AMBULATORY NON FORMULARY MEDICATION Medication Name: Gi Cocktail Take 5-10 cc every 6-8 hours as needed.   aspirin EC 81 MG tablet Take 81 mg by mouth daily.   cetirizine (ZYRTEC) 10 MG tablet Take 10 mg by mouth at bedtime.   Cholecalciferol (VITAMIN D-3) 25 MCG (1000 UT) CAPS Take 1,000 Units by mouth daily.  escitalopram (LEXAPRO) 10 MG tablet Take 10 mg by mouth at bedtime.    levothyroxine (SYNTHROID, LEVOTHROID) 50 MCG tablet Take 50 mcg by mouth daily before breakfast.   metoprolol succinate (TOPROL XL) 25 MG 24 hr tablet Take 1 tablet (25 mg total) by mouth daily. (Patient taking differently: Take 25 mg by mouth every evening. )   omeprazole (PRILOSEC) 40 MG capsule Take 40 mg by mouth daily.   omeprazole (PRILOSEC) 40 MG capsule Take 1 capsule (40 mg total) by mouth 2 (two) times a day.     rosuvastatin (CRESTOR) 40 MG tablet Take 40 mg by mouth at bedtime.      Allergies:   Wellbutrin [bupropion] and Levaquin [levofloxacin]   Social History   Socioeconomic History   Marital status: Married    Spouse name: Not on file   Number of children: 2   Years of education: Not on file   Highest education level: Not on file  Occupational History   Occupation: [employer] resource strain: Not on file   Food insecurity    Worry: Not on file    Inability: Not on file   Transportation needs    Medical: Not on file    Non-medical: Not on file  Tobacco Use   Smoking status: Never Smoker   Smokeless tobacco: Never Used  Substance and Sexual Activity   Alcohol use: No   Drug use: No   Sexual activity: Not on file  Lifestyle   Physical activity    Days per week: Not on file    Minutes per session: Not on file   Stress: Not on file  Relationships   Social connections    Talks on phone: Not on file    Gets together: Not on file    Attends religious service: Not on file    Active member of club or organization: Not on file    Attends meetings of clubs or organizations: Not on file    Relationship status: Not on file  Other Topics Concern   Not on file  Social History Narrative   Not on file     Family History: The patient's family history includes COPD in her father; Dementia in her mother; Throat cancer in her brother. There is no history of Colon cancer or Esophageal cancer.  ROS:   Please see the history of present illness.    All other systems reviewed and are negative.  EKGs/Labs/Other Studies Reviewed:    The following studies were reviewed today: IMPRESSIONS    1. The left ventricle has normal systolic function, with an ejection fraction of 55-60%. The cavity size was normal. Left ventricular diastolic Doppler parameters are consistent with impaired relaxation. Elevated mean left atrial pressure  No evidence of  left ventricular regional wall motion abnormalities.  2. The right ventricle has normal systolic function. The cavity was normal. There is no increase in right ventricular wall thickness.  3. Mild thickening of the mitral valve leaflet. There is moderate mitral annular calcification present.  4. A 15mm an Wende Crease Sapien bioprosthetic aortic valve (TAVR) valve is present in the aortic position. Procedure Date: 03/03/18 Normal aortic valve prosthesis. Echo findings are consistent with perivalvular leak of the aortic prosthesis.  5. TAVR valve     Peak velocity: 3.25m/s     Mean gradient 76mmHg     mild perivalvular leak.  6. The aortic root is normal in size and structure.  FINDINGS  Recent Labs: 02/27/2018: ALT 42; B Natriuretic Peptide 75.7 03/04/2018: Magnesium 2.0 06/08/2018: BUN 14; Creatinine, Ser 1.04; Hemoglobin 12.7; Platelets 183; Potassium 4.5; Sodium 134  Recent Lipid Panel No results found for: CHOL, TRIG, HDL, CHOLHDL, VLDL, LDLCALC, LDLDIRECT  Physical Exam:    VS:  BP (!) 112/58 (BP Location: Left Arm, Patient Position: Sitting, Cuff Size: Normal)    Pulse (!) 57    Ht 5\' 2"  (1.575 m)    Wt 143 lb (64.9 kg)    SpO2 98%    BMI 26.16 kg/m     Wt Readings from Last 3 Encounters:  11/12/18 143 lb (64.9 kg)  10/27/18 150 lb (68 kg)  09/22/18 150 lb (68 kg)     GEN: Patient is in no acute distress HEENT: Normal NECK: No JVD; No carotid bruits LYMPHATICS: No lymphadenopathy CARDIAC: Hear sounds regular, 2/6 systolic murmur at the apex. RESPIRATORY:  Clear to auscultation without rales, wheezing or rhonchi  ABDOMEN: Soft, non-tender, non-distended MUSCULOSKELETAL:  No edema; No deformity  SKIN: Warm and dry NEUROLOGIC:  Alert and oriented x 3 PSYCHIATRIC:  Normal affect   Signed, Jenean Lindau, MD  11/12/2018 1:38 PM    [facility]

## 2018-11-12 NOTE — Telephone Encounter (Signed)
The appt to discuss procedure has been scheduled.  The pt will schedule procedure at her office visit.

## 2018-11-12 NOTE — Telephone Encounter (Signed)
-----   Message from Irving Copas., MD sent at 11/12/2018  8:23 AM EDT ----- Regarding: RE: routine EMR of colon polyp Raj,Looks like it is very interesting polyp to go after.Would be happy to see her.Lema Heinkel, go ahead and schedule a clinic visit and look for a: EMR slot 2 hours.   If she wants to wait to schedule it until after our clinic visit that is fine as well but probably reasonable since we are getting pretty full.Thank you.GM ----- Message ----- From: Jackquline Denmark, MD Sent: 11/11/2018   7:34 PM EDT To: Irving Copas., MD Subject: routine EMR of colon polyp                      Hi Gabe  Can you please go EMR on this very nice patient of mine who has 3 cm ascending colon flat polyp.  Bx- tubular adenoma.  By no means urgent.  Thx  Merrie Roof

## 2018-11-12 NOTE — Patient Instructions (Signed)

## 2018-11-17 NOTE — Telephone Encounter (Signed)
Patient called said that the GI cocktail helped her a lot and she loved it. She would like to know if she needs more.

## 2018-11-17 NOTE — Telephone Encounter (Signed)
Please advise 

## 2018-11-24 MED ORDER — AMBULATORY NON FORMULARY MEDICATION: 2 refills | Status: DC

## 2018-11-24 NOTE — Telephone Encounter (Signed)
Please call her another prescription #[id number], 2 refills Use it prn Thx  RG

## 2018-11-24 NOTE — Telephone Encounter (Signed)
Sent prescription to patients pharmacy, patient informed.

## 2018-11-25 ENCOUNTER — Telehealth: Payer: Self-pay

## 2018-11-25 NOTE — Telephone Encounter (Signed)
Covid-19 screening questions   Do you now or have you had a fever in the last 14 days? No  Do you have any respiratory symptoms of shortness of breath or cough now or in the last 14 days? No  Do you have any family members or close contacts with diagnosed or suspected Covid-19 in the past 14 days? No  Have you been tested for Covid-19 and found to be positive? No        

## 2018-11-26 ENCOUNTER — Other Ambulatory Visit: Payer: Self-pay

## 2018-11-26 ENCOUNTER — Encounter: Payer: Self-pay | Admitting: Gastroenterology

## 2018-11-26 ENCOUNTER — Ambulatory Visit (INDEPENDENT_AMBULATORY_CARE_PROVIDER_SITE_OTHER): Payer: Medicare Other | Admitting: Gastroenterology

## 2018-11-26 VITALS — BP 134/74 | HR 60 | Temp 98.6°F | Ht 62.0 in | Wt 143.1 lb

## 2018-11-26 DIAGNOSIS — Z86010 Personal history of colonic polyps: Secondary | ICD-10-CM | POA: Diagnosis not present

## 2018-11-26 DIAGNOSIS — K219 Gastro-esophageal reflux disease without esophagitis: Secondary | ICD-10-CM | POA: Diagnosis not present

## 2018-11-26 MED ORDER — PANTOPRAZOLE SODIUM 40 MG PO TBEC: 40.0000 mg | DELAYED_RELEASE_TABLET | Freq: Every day | ORAL | 6 refills | Status: DC

## 2018-11-26 MED ORDER — DICYCLOMINE HCL 10 MG PO CAPS: 10.0000 mg | ORAL_CAPSULE | Freq: Two times a day (BID) | ORAL | 2 refills | Status: DC | PRN

## 2018-11-26 NOTE — Progress Notes (Signed)
Chief Complaint: FU  Referring Provider:  [name]., MD      ASSESSMENT AND PLAN;   #1.  GERD with Schatzki's ring s/p EGD with dil to 50 Fr 10/27/2018 with resolution of dysphagia. H/O noncardiac chest pains. Better with GI cocktail.  #2. Severe AS s/p TAVR 03/03/2018 off plavix 08/2018. Followed by Dr Geraldo Pitter. 2DE- Nl, normally functioning TAVR. Doing quite well clinically. OK by cardiology to undergo GI procedures.  #3. Large ascending tubular adenomas. Awaiting EMR   Plan: - Change omeprazole to protonix 40mg  po QD #30, 6 reffils. - GI coctail PRN.  She was given prescription with 2 refills. - Continue Bentyl 10mg  po bid prn #60, 6 refills - She already has appointment with Dr. Rush Landmark for EMR of large a sending colon polyp.  We will follow his recommendations.  - FU in 6 months.   HPI:    Shannon Mills is a 78 y.o. female  For follow-up visit. No further dysphagia Still has epigastric discomfort and noncardiac chest pains.  Much better with GI cocktail. Omeprazole is not helping much.  NO shortness of breath.  No nausea, vomiting, regurgitation, odynophagia.  No significant diarrhea or constipation.  There is no melena or hematochezia. No unintentional weight loss.   No lower abdominal pain.  Had episode of chest pains in February-noncardiac admitted to [facility].  Cardiac work-up was negative.  Husband- [name] is pt is ours. Past Medical History:  Diagnosis Date  . Anxiety   . Chronic reflux esophagitis   . Hiatal hernia   . Hx of colonic polyp   . Hypercholesterolemia   . Hypertension   . Hypothyroidism   . Interstitial cystitis   . Osteoporosis   . S/P TAVR (transcatheter aortic valve replacement)    23 mm Edwards Sapien 3 transcatheter heart valve placed via percutaneous right transfemoral approach   . Severe aortic stenosis   . Vitamin D deficiency     Past Surgical History:  Procedure Laterality Date  . CHOLECYSTECTOMY    .  COLONOSCOPY  09/18/2011   Colonic polyp, status post polypectomy. Mild sigmoid diverticulosis. Otherwise grossly normal colonoscopy to terminal ileum   . FOOT SURGERY    . INTRAOPERATIVE TRANSTHORACIC ECHOCARDIOGRAM N/A 03/03/2018   Procedure: INTRAOPERATIVE TRANSTHORACIC ECHOCARDIOGRAM;  Surgeon: Burnell Blanks, MD;  Location: [facility];  Service: Open Heart Surgery;  Laterality: N/A;  . RIGHT/LEFT HEART CATH AND CORONARY ANGIOGRAPHY N/A 02/03/2018   Procedure: RIGHT/LEFT HEART CATH AND CORONARY ANGIOGRAPHY;  Surgeon: Martinique, Peter M, MD;  Location: [facility];  Service: Cardiovascular;  Laterality: N/A;  . TRANSCATHETER AORTIC VALVE REPLACEMENT, TRANSFEMORAL  03/03/2018  . TRANSCATHETER AORTIC VALVE REPLACEMENT, TRANSFEMORAL N/A 03/03/2018   Procedure: TRANSCATHETER AORTIC VALVE REPLACEMENT, TRANSFEMORAL;  Surgeon: Burnell Blanks, MD;  Location: [facility];  Service: Open Heart Surgery;  Laterality: N/A;  . TUBAL LIGATION      Family History  Problem Relation Age of Onset  . Dementia Mother   . COPD Father   . Throat cancer Brother   . Colon cancer Neg Hx   . Esophageal cancer Neg Hx     Social History   Tobacco Use  . Smoking status: Never Smoker  . Smokeless tobacco: Never Used  Substance Use Topics  . Alcohol use: No  . Drug use: No    Current Outpatient Medications  Medication Sig Dispense Refill  . aspirin EC 81 MG tablet Take 81 mg by mouth daily.    Marland Kitchen  cetirizine (ZYRTEC) 10 MG tablet Take 10 mg by mouth at bedtime.    . Cholecalciferol (VITAMIN D-3) 25 MCG (1000 UT) CAPS Take 1,000 Units by mouth daily.    Marland Kitchen escitalopram (LEXAPRO) 10 MG tablet Take 10 mg by mouth at bedtime.     Marland Kitchen levothyroxine (SYNTHROID, LEVOTHROID) 50 MCG tablet Take 50 mcg by mouth daily before breakfast.    . metoprolol succinate (TOPROL XL) 25 MG 24 hr tablet Take 1 tablet (25 mg total) by mouth daily. (Patient taking differently: Take 25 mg by mouth every evening. ) 90 tablet 3   . omeprazole (PRILOSEC) 40 MG capsule Take 1 capsule (40 mg total) by mouth 2 (two) times a day. 60 capsule 2  . rosuvastatin (CRESTOR) 40 MG tablet Take 40 mg by mouth at bedtime.     . AMBULATORY NON FORMULARY MEDICATION Medication Name: Gi Cocktail Take 5-10 cc every 6-8 hours as needed. (Patient not taking: Reported on 11/26/2018) 120 mL 2   No current facility-administered medications for this visit.     Allergies  Allergen Reactions  . Wellbutrin [Bupropion] Nausea Only    EXTREME NAUSEA  . Levaquin [Levofloxacin] Nausea And Vomiting    Review of Systems:  neg     Physical Exam:    BP 134/74   Pulse 60   Temp 98.6 F (37 C)   Ht 5\' 2"  (1.575 m)   Wt 143 lb 2 oz (64.9 kg)   BMI 26.18 kg/m  Filed Weights   11/26/18 1031  Weight: 143 lb 2 oz (64.9 kg)   Not examined -   Data Reviewed: I have personally reviewed following labs and imaging studies  CBC: CBC Latest Ref Rng & Units 06/08/2018 03/04/2018 03/03/2018  WBC 4.0 - 10.5 K/uL 4.9 4.8 -  Hemoglobin 12.0 - 15.0 g/dL 12.7 11.3(L) 11.2(L)  Hematocrit 36.0 - 46.0 % 38.7 35.4(L) 33.0(L)  Platelets 150 - 400 K/uL 183 112(L) -    CMP: CMP Latest Ref Rng & Units 06/08/2018 03/04/2018 03/03/2018  Glucose 70 - 99 mg/dL 83 99 117(H)  BUN 8 - 23 mg/dL 14 12 17   Creatinine 0.44 - 1.00 mg/dL 1.04(H) 0.91 0.90  Sodium 135 - 145 mmol/L 134(L) 139 141  Potassium 3.5 - 5.1 mmol/L 4.5 3.7 3.8  Chloride 98 - 111 mmol/L 102 110 105  CO2 22 - 32 mmol/L 23 26 -  Calcium 8.9 - 10.3 mg/dL 9.0 8.6(L) -  Total Protein 6.5 - 8.1 g/dL - - -  Total Bilirubin 0.3 - 1.2 mg/dL - - -  Alkaline Phos 38 - 126 U/L - - -  AST 15 - 41 U/L - - -  ALT 0 - 44 U/L - - -      Carmell Austria, MD 11/26/2018, 10:57 AM  Cc: [name]., MD

## 2018-11-26 NOTE — Patient Instructions (Signed)
If you are age 78 or older, your body mass index should be between 23-30. Your Body mass index is 26.18 kg/m. If this is out of the aforementioned range listed, please consider follow up with your Primary Care Provider.  If you are age 29 or younger, your body mass index should be between 19-25. Your Body mass index is 26.18 kg/m. If this is out of the aformentioned range listed, please consider follow up with your Primary Care Provider.   We have sent the following medications to your pharmacy for you to pick up at your convenience: Bentyl Protonix  Thank you,  Dr. Jackquline Denmark

## 2018-12-15 ENCOUNTER — Ambulatory Visit (INDEPENDENT_AMBULATORY_CARE_PROVIDER_SITE_OTHER): Payer: Medicare Other | Admitting: Gastroenterology

## 2018-12-15 ENCOUNTER — Encounter: Payer: Self-pay | Admitting: Gastroenterology

## 2018-12-15 VITALS — BP 137/70 | HR 72 | Temp 97.5°F | Ht 62.0 in | Wt 143.0 lb

## 2018-12-15 DIAGNOSIS — R933 Abnormal findings on diagnostic imaging of other parts of digestive tract: Secondary | ICD-10-CM

## 2018-12-15 DIAGNOSIS — D122 Benign neoplasm of ascending colon: Secondary | ICD-10-CM

## 2018-12-15 DIAGNOSIS — Z860100 Personal history of colon polyps, unspecified: Secondary | ICD-10-CM | POA: Insufficient documentation

## 2018-12-15 HISTORY — DX: Personal history of colon polyps, unspecified: Z86.0100

## 2018-12-15 HISTORY — DX: Abnormal findings on diagnostic imaging of other parts of digestive tract: R93.3

## 2018-12-15 HISTORY — DX: Benign neoplasm of ascending colon: D12.2

## 2018-12-15 HISTORY — DX: Personal history of colonic polyps: Z86.010

## 2018-12-15 NOTE — Patient Instructions (Addendum)
It has been recommended to you by your physician that you have a(n) Colon +EMR  2 hr hospital case completed.  We did not schedule the procedure(s) today due to schedule availability. Our office will contact you once we have Oct 2020 Hospital schedule available.   - Endoscopic Mucosal Resection  -OVESCO full thickness Resection -Endorotor   Your provider has requested that you go to the basement level for lab work 1 week prior to hospital procedure. Press "B" on the elevator. The lab is located at the first door on the left as you exit the elevator.   Thank you for choosing me and [facility].  Dr. Rush Landmark

## 2018-12-15 NOTE — Progress Notes (Signed)
Shannon Mills VISIT   Primary Care Provider [name]., MD [place] [address] [phone]  Referring Provider Dr. Lyndel Safe  Patient Profile: Shannon Mills is a 78 y.o. female with a pmh significant for GERD, hiatal hernia, hyperlipidemia, hypertension, hypothyroidism, status post TAVR for aortic stenosis, anxiety, colon polyps.  The patient presents to the [facility] for an evaluation and management of problem(s) noted below:  Problem List 1. Adenomatous polyp of ascending colon   2. History of colonic polyps   3. Abnormal colonoscopy     History of Present Illness Please see initial consultation note by Dr. Lyndel Safe for full details of HPI.  This is a patient who presents for consideration of advanced polypectomy discussion.  She was found in July to have a large colon polyp in the ascending colon that was sessile, granular appearing and biopsies returned as adenomatous polyp.  Today, the patient is doing well.  She is accompanied by her husband.  Her last colonoscopy was in 2013.  Overall she has a bit of anxiousness about having to undergo repeat colonoscopy for this polyp if we can remove it without Cameron's.  However she is hopeful to stave off surgery.  She has experienced in the sense that her husband had a large polyp that required a surgical resection years prior.  She currently is not having any other major GI issues other than what she is described to Dr. Lyndel Safe in the past.  She denies any hematochezia or melena.  GI Review of Systems Positive as above Negative for odynophagia, nausea, vomiting, pain, change in bowel habits  Review of Systems General: Denies fevers/chills/weight loss HEENT: Denies oral lesions Cardiovascular: Denies chest pain/palpitations Pulmonary: Denies shortness of breath Gastroenterological: See HPI Genitourinary: Denies darkened urine Hematological: Denies easy  bruising/bleeding Endocrine: Denies temperature intolerance Dermatological: Denies jaundice Psychological: Mood is stable   Medications Current Outpatient Medications  Medication Sig Dispense Refill   AMBULATORY NON FORMULARY MEDICATION Medication Name: Gi Cocktail Take 5-10 cc every 6-8 hours as needed. 120 mL 2   aspirin EC 81 MG tablet Take 81 mg by mouth daily.     cetirizine (ZYRTEC) 10 MG tablet Take 10 mg by mouth at bedtime.     Cholecalciferol (VITAMIN D-3) 25 MCG (1000 UT) CAPS Take 1,000 Units by mouth daily.     dicyclomine (BENTYL) 10 MG capsule Take 1 capsule (10 mg total) by mouth 2 (two) times daily as needed for spasms. 60 capsule 2   escitalopram (LEXAPRO) 10 MG tablet Take 10 mg by mouth at bedtime.      levothyroxine (SYNTHROID, LEVOTHROID) 50 MCG tablet Take 50 mcg by mouth daily before breakfast.     metoprolol succinate (TOPROL XL) 25 MG 24 hr tablet Take 1 tablet (25 mg total) by mouth daily. (Patient taking differently: Take 25 mg by mouth every evening. ) 90 tablet 3   pantoprazole (PROTONIX) 40 MG tablet Take 1 tablet (40 mg total) by mouth daily. 30 tablet 6   rosuvastatin (CRESTOR) 40 MG tablet Take 40 mg by mouth at bedtime.      No current facility-administered medications for this visit.     Allergies Allergies  Allergen Reactions   Wellbutrin [Bupropion] Nausea Only    EXTREME NAUSEA   Levaquin [Levofloxacin] Nausea And Vomiting    Histories Past Medical History:  Diagnosis Date   Anxiety    Chronic reflux esophagitis    Hiatal hernia  Hx of colonic polyp    Hypercholesterolemia    Hypertension    Hypothyroidism    Interstitial cystitis    Osteoporosis    S/P TAVR (transcatheter aortic valve replacement)    23 mm Edwards Sapien 3 transcatheter heart valve placed via percutaneous right transfemoral approach    Severe aortic stenosis    Vitamin D deficiency    Past Surgical History:  Procedure Laterality Date    CHOLECYSTECTOMY     COLONOSCOPY  09/18/2011   Colonic polyp, status post polypectomy. Mild sigmoid diverticulosis. Otherwise grossly normal colonoscopy to terminal ileum    FOOT SURGERY     INTRAOPERATIVE TRANSTHORACIC ECHOCARDIOGRAM N/A 03/03/2018   Procedure: INTRAOPERATIVE TRANSTHORACIC ECHOCARDIOGRAM;  Surgeon: Burnell Blanks, MD;  Location: [facility];  Service: Open Heart Surgery;  Laterality: N/A;   RIGHT/LEFT HEART CATH AND CORONARY ANGIOGRAPHY N/A 02/03/2018   Procedure: RIGHT/LEFT HEART CATH AND CORONARY ANGIOGRAPHY;  Surgeon: Martinique, Peter M, MD;  Location: [facility];  Service: Cardiovascular;  Laterality: N/A;   TRANSCATHETER AORTIC VALVE REPLACEMENT, TRANSFEMORAL  03/03/2018   TRANSCATHETER AORTIC VALVE REPLACEMENT, TRANSFEMORAL N/A 03/03/2018   Procedure: TRANSCATHETER AORTIC VALVE REPLACEMENT, TRANSFEMORAL;  Surgeon: Burnell Blanks, MD;  Location: [facility];  Service: Open Heart Surgery;  Laterality: N/A;   TUBAL LIGATION     Social History   Socioeconomic History   Marital status: Married    Spouse name: Not on file   Number of children: 2   Years of education: Not on file   Highest education level: Not on file  Occupational History   Occupation: [employer] resource strain: Not on file   Food insecurity    Worry: Not on file    Inability: Not on file   Transportation needs    Medical: Not on file    Non-medical: Not on file  Tobacco Use   Smoking status: Never Smoker   Smokeless tobacco: Never Used  Substance and Sexual Activity   Alcohol use: No   Drug use: No   Sexual activity: Not on file  Lifestyle   Physical activity    Days per week: Not on file    Minutes per session: Not on file   Stress: Not on file  Relationships   Social connections    Talks on phone: Not on file    Gets together: Not on file    Attends religious service: Not on file    Active member of club or  organization: Not on file    Attends meetings of clubs or organizations: Not on file    Relationship status: Not on file   Intimate partner violence    Fear of current or ex partner: Not on file    Emotionally abused: Not on file    Physically abused: Not on file    Forced sexual activity: Not on file  Other Topics Concern   Not on file  Social History Narrative   Not on file   Family History  Problem Relation Age of Onset   Dementia Mother    COPD Father    Throat cancer Brother    Colon cancer Neg Hx    Esophageal cancer Neg Hx    Inflammatory bowel disease Neg Hx    Liver disease Neg Hx    Pancreatic cancer Neg Hx    Rectal cancer Neg Hx    Stomach cancer Neg Hx    I have reviewed her medical, social,  and family history in detail and updated the electronic medical record as necessary.    PHYSICAL EXAMINATION  BP 137/70    Pulse 72    Temp (!) 97.5 F (36.4 C)    Ht 5\' 2"  (1.575 m)    Wt 143 lb (64.9 kg)    BMI 26.16 kg/m  Wt Readings from Last 3 Encounters:  12/15/18 143 lb (64.9 kg)  11/26/18 143 lb 2 oz (64.9 kg)  11/12/18 143 lb (64.9 kg)  GEN: NAD, appears stated age, doesn't appear chronically ill, accompanied by husband PSYCH: Cooperative, without pressured speech EYE: Conjunctivae pink, sclerae anicteric ENT: MMM, without oral ulcers, no erythema or exudates noted NECK: Supple CV: RR without R/Gs  RESP: CTAB posteriorly, without wheezing GI: NABS, soft, NT, mildly protuberant, without rebound or guarding, no HSM appreciated MSK/EXT: No lower extremity edema SKIN: No jaundice NEURO:  Alert & Oriented x 3, no focal deficits   REVIEW OF DATA  I reviewed the following data at the time of this encounter:  GI Procedures and Studies  July 2020 colonoscopy - Large flat proximal ascending colon. Biopsied. - Colonic polyps status post polypectomy. - Mild sigmoid diverticulosis.  Laboratory Studies  Reviewed those in epic  Imaging Studies  No  relevant studies to review   ASSESSMENT  Ms. Bloodsaw is a 78 y.o. female with a pmh significant for GERD, hiatal hernia, hyperlipidemia, hypertension, hypothyroidism, status post TAVR for aortic stenosis, anxiety, colon polyps.  The patient is seen today for evaluation and management of:  1. Adenomatous polyp of ascending colon   2. History of colonic polyps   3. Abnormal colonoscopy    The patient is clinically and hemodynamically stable at this point in time.  Based upon the description and endoscopic pictures I do feel that it is reasonable to pursue an Advanced Polypectomy attempt of the polyp/lesion.  We discussed some of the techniques of advanced polypectomy which include Endoscopic Mucosal Resection, OVESCO Full-Thickness Resection, Endorotor Morcellation, and Tissue Ablation via Fulguration.  The risks and benefits of endoscopic evaluation were discussed with the patient; these include but are not limited to the risk of perforation, infection, bleeding, missed lesions, lack of diagnosis, severe illness requiring hospitalization, as well as anesthesia and sedation related illnesses.  During attempts at advanced polypectomy, the risks of bleeding and perforation/leak are increased as opposed to diagnostic and screening colonoscopies, and that was discussed with the patient as well.   In addition, I explained that with the possible need for piecemeal resection, subsequent short-interval endoscopic evaluation for follow up and potential retreatment of the lesion/area may be necessary.  I did offer, a referral to surgery in order for patient to have opportunity to discuss surgical management/intervention prior to finalizing decision for attempt at endoscopic removal, however, the patient deferred on this.  If, after attempt at removal of the polyp, it is found that the patient has a complication or that an invasive lesion or malignant lesion is found, or that the polyp continues to recur, the patient  is aware and understands that surgery may still be indicated/required.  All patient questions were answered, to the best of my ability, and the patient agrees to the aforementioned plan of action with follow-up as indicated.   PLAN  Laboratories as outlined below (preprocedure) Proceed with scheduling colonoscopy with EMR in 2-hour slot   Orders Placed This Encounter  Procedures   CBC   Basic Metabolic Panel (BMET)   INR/PT    New Prescriptions  No medications on file   Modified Medications   No medications on file    Planned Follow Up No follow-ups on file.   Justice Britain, MD [facility] Advanced Endoscopy Office # [phone]

## 2018-12-20 ENCOUNTER — Other Ambulatory Visit: Payer: Self-pay | Admitting: Gastroenterology

## 2019-01-03 DIAGNOSIS — Z23 Encounter for immunization: Secondary | ICD-10-CM | POA: Diagnosis not present

## 2019-01-11 ENCOUNTER — Telehealth: Payer: Self-pay | Admitting: Gastroenterology

## 2019-01-11 ENCOUNTER — Other Ambulatory Visit: Payer: Self-pay

## 2019-01-11 DIAGNOSIS — Z86010 Personal history of colonic polyps: Secondary | ICD-10-CM

## 2019-01-11 DIAGNOSIS — R933 Abnormal findings on diagnostic imaging of other parts of digestive tract: Secondary | ICD-10-CM

## 2019-01-11 DIAGNOSIS — D122 Benign neoplasm of ascending colon: Secondary | ICD-10-CM

## 2019-01-11 NOTE — Telephone Encounter (Signed)
Pt scheduled for Colon+EMR on 02/10/19 @ [facility] 1:15pm. Pt informed that she will also need labs 1 week prior to procedure and Covid Testing. Information has been mailed to pt. Pt will call with any questions once she receives packet of information. Pt voiced understanding.

## 2019-01-11 NOTE — Telephone Encounter (Signed)
Pt called stating that she was supposed to be scheduled for a procedure with Dr. Rush Landmark at the hospital to remove a polyp. Pls call her.

## 2019-01-11 NOTE — Telephone Encounter (Signed)
Do you have her on your list?  I didn't want both of [place] working on it.  Let me know what I need to do.

## 2019-01-18 DIAGNOSIS — F419 Anxiety disorder, unspecified: Secondary | ICD-10-CM | POA: Diagnosis not present

## 2019-01-18 DIAGNOSIS — E063 Autoimmune thyroiditis: Secondary | ICD-10-CM | POA: Diagnosis not present

## 2019-01-18 DIAGNOSIS — E785 Hyperlipidemia, unspecified: Secondary | ICD-10-CM | POA: Diagnosis not present

## 2019-01-18 DIAGNOSIS — Z952 Presence of prosthetic heart valve: Secondary | ICD-10-CM | POA: Diagnosis not present

## 2019-01-19 NOTE — Telephone Encounter (Addendum)
Pt had to be r/s from 02/10/19 to 02/18/19 due to scheduling issues at [facility]. New instructions have been sent to pt. Pt is aware and will call with any questions once she receives information in the mail.

## 2019-01-21 DIAGNOSIS — E785 Hyperlipidemia, unspecified: Secondary | ICD-10-CM | POA: Diagnosis not present

## 2019-01-21 DIAGNOSIS — E063 Autoimmune thyroiditis: Secondary | ICD-10-CM | POA: Diagnosis not present

## 2019-02-06 ENCOUNTER — Other Ambulatory Visit (HOSPITAL_COMMUNITY): Payer: Medicare Other

## 2019-02-13 ENCOUNTER — Other Ambulatory Visit (HOSPITAL_COMMUNITY): Payer: Medicare Other

## 2019-02-15 ENCOUNTER — Other Ambulatory Visit (HOSPITAL_COMMUNITY)
Admission: RE | Admit: 2019-02-15 | Discharge: 2019-02-15 | Disposition: A | Discharge status: Home / Self Care | Payer: Medicare Other | Source: Ambulatory Visit | Attending: Gastroenterology | Admitting: Gastroenterology

## 2019-02-15 ENCOUNTER — Other Ambulatory Visit (HOSPITAL_COMMUNITY): Payer: Medicare Other

## 2019-02-15 ENCOUNTER — Other Ambulatory Visit (INDEPENDENT_AMBULATORY_CARE_PROVIDER_SITE_OTHER): Payer: Medicare Other

## 2019-02-15 DIAGNOSIS — D122 Benign neoplasm of ascending colon: Secondary | ICD-10-CM | POA: Diagnosis not present

## 2019-02-15 DIAGNOSIS — Z86010 Personal history of colonic polyps: Secondary | ICD-10-CM

## 2019-02-15 DIAGNOSIS — Z01812 Encounter for preprocedural laboratory examination: Secondary | ICD-10-CM | POA: Diagnosis not present

## 2019-02-15 DIAGNOSIS — Z20828 Contact with and (suspected) exposure to other viral communicable diseases: Secondary | ICD-10-CM | POA: Diagnosis not present

## 2019-02-15 DIAGNOSIS — R933 Abnormal findings on diagnostic imaging of other parts of digestive tract: Secondary | ICD-10-CM

## 2019-02-15 LAB — PROTIME-INR
INR: 1.1 ratio — ABNORMAL HIGH (ref 0.8–1.0)
Prothrombin Time: 12.3 s (ref 9.6–13.1)

## 2019-02-15 LAB — CBC
HCT: 37.5 % (ref 36.0–46.0)
Hemoglobin: 12.5 g/dL (ref 12.0–15.0)
MCHC: 33.3 g/dL (ref 30.0–36.0)
MCV: 86.4 fl (ref 78.0–100.0)
Platelets: 124 10*3/uL — ABNORMAL LOW (ref 150.0–400.0)
RBC: 4.34 Mil/uL (ref 3.87–5.11)
RDW: 14.5 % (ref 11.5–15.5)
WBC: 4.3 10*3/uL (ref 4.0–10.5)

## 2019-02-15 LAB — BASIC METABOLIC PANEL
BUN: 17 mg/dL (ref 6–23)
CO2: 26 mEq/L (ref 19–32)
Calcium: 9.3 mg/dL (ref 8.4–10.5)
Chloride: 104 mEq/L (ref 96–112)
Creatinine, Ser: 1.03 mg/dL (ref 0.40–1.20)
GFR: 51.76 mL/min — ABNORMAL LOW (ref >60.00)
Glucose, Bld: 100 mg/dL — ABNORMAL HIGH (ref 70–99)
Potassium: 4 mEq/L (ref 3.5–5.1)
Sodium: 139 mEq/L (ref 135–145)

## 2019-02-16 LAB — NOVEL CORONAVIRUS, NAA (HOSP ORDER, SEND-OUT TO REF LAB; TAT 18-24 HRS): SARS-CoV-2, NAA: NOT DETECTED

## 2019-02-17 ENCOUNTER — Other Ambulatory Visit: Payer: Self-pay

## 2019-02-17 ENCOUNTER — Encounter (HOSPITAL_COMMUNITY): Payer: Self-pay | Admitting: *Deleted

## 2019-02-17 NOTE — Progress Notes (Signed)
Pt denies SOB and chest pain. Pt stated that she is under the care of Dr. Angelena Form, Cardiology. Pt made aware to stop taking vitamins, fish oil and herbal medications. Do not take any NSAIDs ie: Ibuprofen, Advil, Naproxen (Aleve), Motrin, BC and Goody Powder. Pt verbalized understanding of all pre-op instructions.

## 2019-02-17 NOTE — Progress Notes (Signed)
Nurse made Dr. Suzette Battiest, Anesthesiology, aware that pt had a TAVR on 03/03/18 and had a recent follow up visit with cardiologist ( no new orders from anesthesiologist ).

## 2019-02-17 NOTE — Anesthesia Preprocedure Evaluation (Addendum)
Anesthesia Evaluation  Patient identified by MRN, date of birth, ID band Patient awake    Reviewed: Allergy & Precautions, NPO status , Patient's Chart, lab work & pertinent test results, reviewed documented beta blocker date and time   Airway Mallampati: II  TM Distance: >3 FB Neck ROM: Full    Dental no notable dental hx.    Pulmonary neg pulmonary ROS,    Pulmonary exam normal breath sounds clear to auscultation       Cardiovascular hypertension, Pt. on home beta blockers Normal cardiovascular exam Rhythm:Regular Rate:Normal  S/P TAVR (transcatheter aortic valve replacement   Neuro/Psych Anxiety negative neurological ROS     GI/Hepatic Neg liver ROS, hiatal hernia,   Endo/Other  Hypothyroidism   Renal/GU negative Renal ROS     Musculoskeletal negative musculoskeletal ROS (+)   Abdominal   Peds  Hematology HLD   Anesthesia Other Findings Colon Polyps  Reproductive/Obstetrics                            Anesthesia Physical Anesthesia Plan  ASA: II  Anesthesia Plan: MAC   Post-op Pain Management:    Induction: Intravenous  PONV Risk Score and Plan: 2 and Propofol infusion and Treatment may vary due to age or medical condition  Airway Management Planned: Nasal Cannula  Additional Equipment:   Intra-op Plan:   Post-operative Plan:   Informed Consent: I have reviewed the patients History and Physical, chart, labs and discussed the procedure including the risks, benefits and alternatives for the proposed anesthesia with the patient or authorized representative who has indicated his/her understanding and acceptance.     Dental advisory given  Plan Discussed with: CRNA  Anesthesia Plan Comments:        Anesthesia Quick Evaluation

## 2019-02-18 ENCOUNTER — Encounter (HOSPITAL_COMMUNITY)
Admission: RE | Disposition: A | Discharge status: Home / Self Care | Payer: Self-pay | Source: Home / Self Care | Attending: Gastroenterology

## 2019-02-18 ENCOUNTER — Ambulatory Visit (HOSPITAL_COMMUNITY): Payer: Medicare Other | Admitting: Certified Registered Nurse Anesthetist

## 2019-02-18 ENCOUNTER — Encounter (HOSPITAL_COMMUNITY): Payer: Self-pay | Admitting: *Deleted

## 2019-02-18 ENCOUNTER — Ambulatory Visit (HOSPITAL_COMMUNITY)
Admission: RE | Admit: 2019-02-18 | Discharge: 2019-02-18 | Disposition: A | Discharge status: Home / Self Care | Payer: Medicare Other | Attending: Gastroenterology | Admitting: Gastroenterology

## 2019-02-18 DIAGNOSIS — K635 Polyp of colon: Secondary | ICD-10-CM

## 2019-02-18 DIAGNOSIS — D121 Benign neoplasm of appendix: Secondary | ICD-10-CM | POA: Insufficient documentation

## 2019-02-18 DIAGNOSIS — Z86010 Personal history of colonic polyps: Secondary | ICD-10-CM | POA: Diagnosis not present

## 2019-02-18 DIAGNOSIS — I10 Essential (primary) hypertension: Secondary | ICD-10-CM | POA: Insufficient documentation

## 2019-02-18 DIAGNOSIS — Z881 Allergy status to other antibiotic agents status: Secondary | ICD-10-CM | POA: Diagnosis not present

## 2019-02-18 DIAGNOSIS — D122 Benign neoplasm of ascending colon: Secondary | ICD-10-CM | POA: Diagnosis not present

## 2019-02-18 DIAGNOSIS — K648 Other hemorrhoids: Secondary | ICD-10-CM | POA: Diagnosis not present

## 2019-02-18 DIAGNOSIS — D214 Benign neoplasm of connective and other soft tissue of abdomen: Secondary | ICD-10-CM | POA: Diagnosis not present

## 2019-02-18 DIAGNOSIS — D123 Benign neoplasm of transverse colon: Secondary | ICD-10-CM | POA: Diagnosis not present

## 2019-02-18 DIAGNOSIS — K644 Residual hemorrhoidal skin tags: Secondary | ICD-10-CM | POA: Insufficient documentation

## 2019-02-18 DIAGNOSIS — R933 Abnormal findings on diagnostic imaging of other parts of digestive tract: Secondary | ICD-10-CM

## 2019-02-18 HISTORY — PX: HEMOSTASIS CLIP PLACEMENT: SHX6857

## 2019-02-18 HISTORY — PX: ENDOSCOPIC MUCOSAL RESECTION: SHX6839

## 2019-02-18 HISTORY — PX: COLONOSCOPY WITH PROPOFOL: SHX5780

## 2019-02-18 HISTORY — PX: SUBMUCOSAL LIFTING INJECTION: SHX6855

## 2019-02-18 HISTORY — PX: POLYPECTOMY: SHX5525

## 2019-02-18 SURGERY — COLONOSCOPY WITH PROPOFOL
Anesthesia: Monitor Anesthesia Care

## 2019-02-18 MED ORDER — PHENYLEPHRINE 40 MCG/ML (10ML) SYRINGE FOR IV PUSH (FOR BLOOD PRESSURE SUPPORT)
PREFILLED_SYRINGE | INTRAVENOUS | Status: DC | PRN
  Administered 2019-02-18: 80 ug via INTRAVENOUS

## 2019-02-18 MED ORDER — LIDOCAINE 2% (20 MG/ML) 5 ML SYRINGE
INTRAMUSCULAR | Status: DC | PRN
  Administered 2019-02-18: 20 mg via INTRAVENOUS
  Administered 2019-02-18: 60 mg via INTRAVENOUS

## 2019-02-18 MED ORDER — PROPOFOL 500 MG/50ML IV EMUL
INTRAVENOUS | Status: DC | PRN
  Administered 2019-02-18: 150 ug/kg/min via INTRAVENOUS

## 2019-02-18 MED ORDER — EPHEDRINE SULFATE-NACL 50-0.9 MG/10ML-% IV SOSY
PREFILLED_SYRINGE | INTRAVENOUS | Status: DC | PRN
  Administered 2019-02-18 (×6): 5 mg via INTRAVENOUS

## 2019-02-18 MED ORDER — LACTATED RINGERS IV SOLN
INTRAVENOUS | Status: AC | PRN
  Administered 2019-02-18: 1000 mL via INTRAVENOUS

## 2019-02-18 MED ORDER — SODIUM CHLORIDE 0.9 % IV SOLN: INTRAVENOUS | Status: DC

## 2019-02-18 SURGICAL SUPPLY — 22 items

## 2019-02-18 NOTE — Op Note (Addendum)
[facility] Patient Name: Shannon Mills Procedure Date : 02/18/2019 MRN: [id number] Attending MD: Justice Britain , MD Date of Birth: 1941/01/17 CSN: [id number] Age: 78 Admit Type: Outpatient Procedure:                Colonoscopy Indications:              Excision of colonic polyp Providers:                Justice Britain, MD, Carlyn Reichert, RN, Cherylynn Ridges, Technician, Lerry Paterson CRNA Referring MD:             Jackquline Denmark, MD Medicines:                Monitored Anesthesia Care Complications:            No immediate complications. Estimated Blood Loss:     Estimated blood loss was minimal. Procedure:                Pre-Anesthesia Assessment:                           - Prior to the procedure, a History and Physical                            was performed, and patient medications and                            allergies were reviewed. The patient's tolerance of                            previous anesthesia was also reviewed. The risks                            and benefits of the procedure and the sedation                            options and risks were discussed with the patient.                            All questions were answered, and informed consent                            was obtained. Prior Anticoagulants: The patient has                            taken no previous anticoagulant or antiplatelet                            agents except for aspirin. ASA Grade Assessment:                            III - A patient with severe systemic disease. After  reviewing the risks and benefits, the patient was                            deemed in satisfactory condition to undergo the                            procedure.                           After obtaining informed consent, the colonoscope                            was passed under direct vision. Throughout the                             procedure, the patient's blood pressure, pulse, and                            oxygen saturations were monitored continuously. The                            PCF-H190DL ([id number] Olympus pediatric colonoscope                            was introduced through the anus and advanced to the                            the cecum, identified by appendiceal orifice and                            ileocecal valve. The colonoscopy was performed                            without difficulty. The patient tolerated the                            procedure. The quality of the bowel preparation was                            adequate. The ileocecal valve, appendiceal orifice,                            and rectum were photographed. Scope In: 7:50:18 AM Scope Out: 9:05:32 AM Scope Withdrawal Time: 1 hour 12 minutes 4 seconds  Total Procedure Duration: 1 hour 15 minutes 14 seconds  Findings:      The digital rectal exam findings include hemorrhoids. Pertinent       negatives include no palpable rectal lesions.      A 12 mm polyp was found in the appendiceal orifice. The polyp was       sessile. Preparations were made for mucosal resection. Orise gel was       injected to raise the lesion. Piecemeal mucosal resection using a snare       was performed. Resection and retrieval were complete. To close the       defect after  mucosal resection, one hemostatic clip was successfully       placed (MR conditional). There was no bleeding at the end of the       procedure.      A 35 mm polyp was found in the proximal ascending colon within the first       haustral fold from the cecum. The polyp was sessile and granular lateral       spreading. Preparations were made for mucosal resection. Orise gel was       injected to raise the lesion. Piecemeal mucosal resection using a snare       was performed. Resection and retrieval were complete. To close the       defect after mucosal resection, twelve hemostatic clips were        successfully placed (MR conditional). We tried to have complete closure       of defect but due to the resection margin it was not possible to zipline       close completely. There was no bleeding at the end of the procedure.      Four sessile polyps were found in the transverse colon (2) and ascending       colon (2). The polyps were 3 to 6 mm in size. These polyps were removed       with a cold snare. Resection and retrieval were complete.      Non-bleeding non-thrombosed external and internal hemorrhoids were found       during retroflexion, during perianal exam and during digital exam. Impression:               - Hemorrhoids found on digital rectal exam.                           - One 12 mm polyp at the appendiceal orifice,                            removed with mucosal resection. Resected and                            retrieved. Clip (MR conditional) was placed.                           - One 35 mm polyp in the proximal ascending colon,                            removed with mucosal resection. Resected and                            retrieved. Clips (MR conditional) were placed.                           - Four 3 to 6 mm polyps in the transverse colon and                            in the ascending colon, removed with a cold snare.                            Resected and retrieved.                           -  Non-bleeding non-thrombosed external and internal                            hemorrhoids. Recommendation:           - The patient will be observed post-procedure,                            until all discharge criteria are met.                           - Discharge patient to home.                           - Patient has a contact number available for                            emergencies. The signs and symptoms of potential                            delayed complications were discussed with the                            patient. Return to normal activities  tomorrow.                            Written discharge instructions were provided to the                            patient.                           - Monitor for signs/symptoms of bleeding,                            perforation, and infection. If issues please call                            our number to get further assistance as needed.                           - High fiber diet.                           - Continue present medications.                           - No ibuprofen, naproxen, or other non-steroidal                            anti-inflammatory drugs for 1 week after polyp                            removal to decrease risk of post-mucosectomy  bleeding.                           - Await pathology results.                           - Repeat colonoscopy in 6 months for surveillance                            after piecemeal mucosectomy.                           - The findings and recommendations were discussed                            with the patient. Procedure Code(s):        --- Professional ---                           [phone], Colonoscopy, flexible; with endoscopic                            mucosal resection                           [id number], 79, Colonoscopy, flexible; with removal of                            tumor(s), polyp(s), or other lesion(s) by snare                            technique Diagnosis Code(s):        --- Professional ---                           K64.8, Other hemorrhoids                           K63.5, Polyp of colon CPT copyright 2019 [employer]. All rights reserved. The codes documented in this report are preliminary and upon coder review may  be revised to meet current compliance requirements. Justice Britain, MD 02/18/2019 9:22:26 AM Number of Addenda: 0

## 2019-02-18 NOTE — Anesthesia Procedure Notes (Signed)
Procedure Name: [place] Performed by: Milford Cage, CRNA Patient Re-evaluated:Patient Re-evaluated prior to induction Oxygen Delivery Method: Simple face mask

## 2019-02-18 NOTE — Anesthesia Postprocedure Evaluation (Signed)
Anesthesia Post Note  Patient: Shannon Mills  Procedure(s) Performed: COLONOSCOPY WITH PROPOFOL (N/A ) ENDOSCOPIC MUCOSAL RESECTION (N/A ) SUBMUCOSAL LIFTING INJECTION HEMOSTASIS CLIP PLACEMENT POLYPECTOMY     Patient location during evaluation: Endoscopy Anesthesia Type: MAC Level of consciousness: awake and alert Pain management: pain level controlled Vital Signs Assessment: post-procedure vital signs reviewed and stable Respiratory status: spontaneous breathing, nonlabored ventilation, respiratory function stable and patient connected to nasal cannula oxygen Cardiovascular status: stable and blood pressure returned to baseline Postop Assessment: no apparent nausea or vomiting Anesthetic complications: no    Last Vitals:  Vitals:   02/18/19 0923 02/18/19 0933  BP: (!) 130/59 132/67  Pulse: 71 66  Resp: 14 15  Temp:    SpO2: 100% 100%    Last Pain:  Vitals:   02/18/19 0933  TempSrc:   PainSc: 0-No pain                 Ryan P Ellender

## 2019-02-18 NOTE — Transfer of Care (Signed)
Immediate Anesthesia Transfer of Care Note  Patient: Shannon Mills  Procedure(s) Performed: COLONOSCOPY WITH PROPOFOL (N/A ) ENDOSCOPIC MUCOSAL RESECTION (N/A ) SUBMUCOSAL LIFTING INJECTION HEMOSTASIS CLIP PLACEMENT POLYPECTOMY  Patient Location: PACU  Anesthesia Type:MAC  Level of Consciousness: awake  Airway & Oxygen Therapy: Patient Spontanous Breathing and Patient connected to face mask oxygen  Post-op Assessment: Report given to RN and Post -op Vital signs reviewed and stable  Post vital signs: Reviewed and stable  Last Vitals:  Vitals Value Taken Time  BP 94/50 02/18/19 0913  Temp    Pulse 67 02/18/19 0913  Resp 16 02/18/19 0913  SpO2 100 % 02/18/19 0913  Vitals shown include unvalidated device data.  Last Pain:  Vitals:   02/18/19 0705  TempSrc: Temporal  PainSc: 0-No pain         Complications: No apparent anesthesia complications

## 2019-02-18 NOTE — Discharge Instructions (Signed)

## 2019-02-18 NOTE — H&P (Signed)
GASTROENTEROLOGY PROCEDURE H&P NOTE   Primary Care Physician: [name]., MD  HPI: Shannon Mills is a 78 y.o. female who presents for Colonoscopy with EMR attempt.  Past Medical History:  Diagnosis Date  . Anxiety   . Chronic reflux esophagitis   . Hiatal hernia   . Hx of colonic polyp   . Hypercholesterolemia   . Hypertension   . Hypothyroidism   . Interstitial cystitis   . Osteoporosis   . S/P TAVR (transcatheter aortic valve replacement)    23 mm Edwards Sapien 3 transcatheter heart valve placed via percutaneous right transfemoral approach   . Severe aortic stenosis   . Vitamin D deficiency    Past Surgical History:  Procedure Laterality Date  . CHOLECYSTECTOMY    . COLONOSCOPY  09/18/2011   Colonic polyp, status post polypectomy. Mild sigmoid diverticulosis. Otherwise grossly normal colonoscopy to terminal ileum   . FOOT SURGERY    . INTRAOPERATIVE TRANSTHORACIC ECHOCARDIOGRAM N/A 03/03/2018   Procedure: INTRAOPERATIVE TRANSTHORACIC ECHOCARDIOGRAM;  Surgeon: Burnell Blanks, MD;  Location: [place];  Service: Open Heart Surgery;  Laterality: N/A;  . RIGHT/LEFT HEART CATH AND CORONARY ANGIOGRAPHY N/A 02/03/2018   Procedure: RIGHT/LEFT HEART CATH AND CORONARY ANGIOGRAPHY;  Surgeon: Martinique, Peter M, MD;  Location: [place] [facility];  Service: Cardiovascular;  Laterality: N/A;  . TRANSCATHETER AORTIC VALVE REPLACEMENT, TRANSFEMORAL  03/03/2018  . TRANSCATHETER AORTIC VALVE REPLACEMENT, TRANSFEMORAL N/A 03/03/2018   Procedure: TRANSCATHETER AORTIC VALVE REPLACEMENT, TRANSFEMORAL;  Surgeon: Burnell Blanks, MD;  Location: [place];  Service: Open Heart Surgery;  Laterality: N/A;  . TUBAL LIGATION     No current facility-administered medications for this encounter.    Allergies  Allergen Reactions  . Wellbutrin [Bupropion] Nausea Only    EXTREME NAUSEA  . Levaquin [Levofloxacin] Nausea And Vomiting   Family History  Problem Relation Age of Onset  .  Dementia Mother   . COPD Father   . Throat cancer Brother   . Colon cancer Neg Hx   . Esophageal cancer Neg Hx   . Inflammatory bowel disease Neg Hx   . Liver disease Neg Hx   . Pancreatic cancer Neg Hx   . Rectal cancer Neg Hx   . Stomach cancer Neg Hx    Social History   Socioeconomic History  . Marital status: Married    Spouse name: Not on file  . Number of children: 2  . Years of education: Not on file  . Highest education level: Not on file  Occupational History  . Occupation: Retired-Office Research scientist (medical)  . Financial resource strain: Not on file  . Food insecurity    Worry: Not on file    Inability: Not on file  . Transportation needs    Medical: Not on file    Non-medical: Not on file  Tobacco Use  . Smoking status: Never Smoker  . Smokeless tobacco: Never Used  Substance and Sexual Activity  . Alcohol use: No  . Drug use: No  . Sexual activity: Not on file  Lifestyle  . Physical activity    Days per week: Not on file    Minutes per session: Not on file  . Stress: Not on file  Relationships  . Social Herbalist on phone: Not on file    Gets together: Not on file    Attends religious service: Not on file    Active member of club or organization: Not on file  Attends meetings of clubs or organizations: Not on file    Relationship status: Not on file  . Intimate partner violence    Fear of current or ex partner: Not on file    Emotionally abused: Not on file    Physically abused: Not on file    Forced sexual activity: Not on file  Other Topics Concern  . Not on file  Social History Narrative  . Not on file    Physical Exam: Vital signs in last 24 hours: Temp:  [97.4 F (36.3 C)] 97.4 F (36.3 C) (10/29 0705) Resp:  [12] 12 (10/29 0705) BP: (131)/(65) 131/65 (10/29 0705) SpO2:  [100 %] 100 % (10/29 0705) Weight:  [64 kg] 64 kg (10/29 0705)   GEN: NAD EYE: Sclerae anicteric ENT: MMM CV: Non-tachycardic GI: Soft, NT/ND  NEURO:  Alert & Oriented x 3  Lab Results: Recent Labs    02/15/19 1111  WBC 4.3  HGB 12.5  HCT 37.5  PLT 124.0*   BMET Recent Labs    02/15/19 1111  NA 139  K 4.0  CL 104  CO2 26  GLUCOSE 100*  BUN 17  CREATININE 1.03  CALCIUM 9.3   LFT No results for input(s): PROT, ALBUMIN, AST, ALT, ALKPHOS, BILITOT, BILIDIR, IBILI in the last 72 hours. PT/INR Recent Labs    02/15/19 1111  LABPROT 12.3  INR 1.1*     Impression / Plan: This is a 78 y.o.female who presents for Colonoscopy with EMR attempt.  The risks and benefits of endoscopic evaluation were discussed with the patient; these include but are not limited to the risk of perforation, infection, bleeding, missed lesions, lack of diagnosis, severe illness requiring hospitalization, as well as anesthesia and sedation related illnesses.  The patient is agreeable to proceed.    Shannon Britain, MD [facility] Office # [phone]

## 2019-02-19 ENCOUNTER — Encounter: Payer: Self-pay | Admitting: Gastroenterology

## 2019-02-19 LAB — SURGICAL PATHOLOGY

## 2019-02-20 ENCOUNTER — Other Ambulatory Visit: Payer: Self-pay | Admitting: Gastroenterology

## 2019-03-01 DIAGNOSIS — Z1231 Encounter for screening mammogram for malignant neoplasm of breast: Secondary | ICD-10-CM | POA: Diagnosis not present

## 2019-03-04 ENCOUNTER — Other Ambulatory Visit: Payer: Self-pay | Admitting: Physician Assistant

## 2019-03-04 DIAGNOSIS — Z952 Presence of prosthetic heart valve: Secondary | ICD-10-CM

## 2019-03-25 ENCOUNTER — Other Ambulatory Visit: Payer: Self-pay | Admitting: Gastroenterology

## 2019-04-12 NOTE — Progress Notes (Signed)
[facility] [place]                                       Cardiology Office Note    Date:  04/13/2019   ID:  Shannon Mills, DOB 02-25-1941, MRN [id number]  PCP:  [name]., MD  Cardiologist: Dr. Geraldo Pitter / Dr. Angelena Form & Dr. Roxy Manns (TAVR)  CC: 1 year s/p TAVR  History of Present Illness:  Shannon Mills is a 78 y.o. 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   The patient developed progressive fatigue with decreased energy, decreased exercise tolerance, DOE and substernal chest tightness brought on with physical exertion and relieved by rest. Transthoracic echocardiogram performed February 03, 2018 revealed severe aortic stenosis with preserved left ventricular systolic function. Diagnostic cardiac catheterization confirmed the presence of severe aortic stenosis and was notable for the absence of significant coronary artery disease.   The patient was evaluated by the multidisciplinary valve team and underwent successful TAVR with a 23 mm Edwards Sapien 3 THV via the TF approach on 03/03/18. Post operative echo showed EF 65-70%, normally functioning TAVR with mean gradient 16 mm Hg and small PVL (oriented towards the pulmonary artery). 1 month post op echo showed EF 70%, normally functioning TAVR with mild PVL and moderate AS with mean gradient 20-30 mm Hg. Given elevated gradients, I recommended 6 month follow up echo which showed a mean gradient of 25 mm Hg.   Today she presents to clinic for follow up. No CP or SOB. No LE edema, orthopnea or PND. No dizziness or syncope. No blood in stool or urine. No palpitations.  She mostly complains of central chest pain when doing certain arm movements like mixing in a bowl.  It usually resolves with stopping that activity and resting.  She also complains of intermittent fatigue.   Past Medical History:   Diagnosis Date  . Anxiety   . Chronic reflux esophagitis   . Hiatal hernia   . Hx of colonic polyp   . Hypercholesterolemia   . Hypertension   . Hypothyroidism   . Interstitial cystitis   . Osteoporosis   . S/P TAVR (transcatheter aortic valve replacement)    23 mm Edwards Sapien 3 transcatheter heart valve placed via percutaneous right transfemoral approach   . Severe aortic stenosis   . Vitamin D deficiency     Past Surgical History:  Procedure Laterality Date  . CHOLECYSTECTOMY    . COLONOSCOPY  09/18/2011   Colonic polyp, status post polypectomy. Mild sigmoid diverticulosis. Otherwise grossly normal colonoscopy to terminal ileum   . COLONOSCOPY WITH PROPOFOL N/A 02/18/2019   Procedure: COLONOSCOPY WITH PROPOFOL;  Surgeon: Rush Landmark Telford Nab., MD;  Location: [place];  Service: Gastroenterology;  Laterality: N/A;  . ENDOSCOPIC MUCOSAL RESECTION N/A 02/18/2019   Procedure: ENDOSCOPIC MUCOSAL RESECTION;  Surgeon: Rush Landmark Telford Nab., MD;  Location: [place];  Service: Gastroenterology;  Laterality: N/A;  . FOOT SURGERY    . HEMOSTASIS CLIP PLACEMENT  02/18/2019   Procedure: HEMOSTASIS CLIP PLACEMENT;  Surgeon: Irving Copas., MD;  Location: [facility] ENDOSCOPY;  Service: Gastroenterology;;  . INTRAOPERATIVE TRANSTHORACIC ECHOCARDIOGRAM N/A 03/03/2018   Procedure: INTRAOPERATIVE TRANSTHORACIC ECHOCARDIOGRAM;  Surgeon: Burnell Blanks, MD;  Location: [place];  Service: Open Heart Surgery;  Laterality: N/A;  . POLYPECTOMY  02/18/2019   Procedure: POLYPECTOMY;  Surgeon: Rush Landmark Telford Nab., MD;  Location: [place];  Service: Gastroenterology;;  . RIGHT/LEFT HEART CATH AND CORONARY ANGIOGRAPHY N/A 02/03/2018   Procedure: RIGHT/LEFT HEART CATH AND CORONARY ANGIOGRAPHY;  Surgeon: Martinique, Peter M, MD;  Location: [place] CV LAB;  Service: Cardiovascular;  Laterality: N/A;  . SUBMUCOSAL LIFTING INJECTION  02/18/2019   Procedure: SUBMUCOSAL LIFTING  INJECTION;  Surgeon: Irving Copas., MD;  Location: [place];  Service: Gastroenterology;;  . TRANSCATHETER AORTIC VALVE REPLACEMENT, TRANSFEMORAL  03/03/2018  . TRANSCATHETER AORTIC VALVE REPLACEMENT, TRANSFEMORAL N/A 03/03/2018   Procedure: TRANSCATHETER AORTIC VALVE REPLACEMENT, TRANSFEMORAL;  Surgeon: Burnell Blanks, MD;  Location: [place];  Service: Open Heart Surgery;  Laterality: N/A;  . TUBAL LIGATION      Current Medications: Outpatient Medications Prior to Visit  Medication Sig Dispense Refill  . AMBULATORY NON FORMULARY MEDICATION Medication Name: Gi Cocktail Take 5-10 cc every 6-8 hours as needed. 120 mL 2  . aspirin EC 81 MG tablet Take 81 mg by mouth daily.    . cetirizine (ZYRTEC) 10 MG tablet Take 10 mg by mouth at bedtime.    . Cholecalciferol (VITAMIN D3) 50 MCG (2000 UT) TABS Take 2,000 Units by mouth daily.    Marland Kitchen escitalopram (LEXAPRO) 10 MG tablet Take 10 mg by mouth at bedtime.     Marland Kitchen levothyroxine (SYNTHROID, LEVOTHROID) 50 MCG tablet Take 50 mcg by mouth daily before breakfast.    . metoprolol succinate (TOPROL XL) 25 MG 24 hr tablet Take 1 tablet (25 mg total) by mouth daily. (Patient taking differently: Take 25 mg by mouth every evening. ) 90 tablet 3  . pantoprazole (PROTONIX) 40 MG tablet Take 1 tablet (40 mg total) by mouth daily. (Patient taking differently: Take 40 mg by mouth at bedtime. ) 30 tablet 6  . rosuvastatin (CRESTOR) 40 MG tablet Take 40 mg by mouth at bedtime.      No facility-administered medications prior to visit.     Allergies:   Wellbutrin [bupropion] and Levaquin [levofloxacin]   Social History   Socioeconomic History  . Marital status: Married    Spouse name: Not on file  . Number of children: 2  . Years of education: Not on file  . Highest education level: Not on file  Occupational History  . Occupation: Nutritional therapist  Tobacco Use  . Smoking status: Never Smoker  . Smokeless tobacco: Never Used   Substance and Sexual Activity  . Alcohol use: No  . Drug use: No  . Sexual activity: Not on file  Other Topics Concern  . Not on file  Social History Narrative  . Not on file   Social Determinants of Health   Financial Resource Strain:   . Difficulty of Paying Living Expenses: Not on file  Food Insecurity:   . Worried About Charity fundraiser in the Last Year: Not on file  . Ran Out of Food in the Last Year: Not on file  Transportation Needs:   . Lack of Transportation (Medical): Not on file  . Lack of Transportation (Non-Medical): Not on file  Physical Activity:   . Days of Exercise per Week: Not on file  . Minutes of Exercise per Session: Not on file  Stress:   . Feeling of Stress : Not on file  Social Connections:   . Frequency of Communication with Friends and Family: Not on file  . Frequency of Social Gatherings  with Friends and Family: Not on file  . Attends Religious Services: Not on file  . Active Member of Clubs or Organizations: Not on file  . Attends Archivist Meetings: Not on file  . Marital Status: Not on file     Family History:  The patient's family history includes COPD in her father; Dementia in her mother; Throat cancer in her brother.      ROS:   Please see the history of present illness.    ROS All other systems reviewed and are negative.   PHYSICAL EXAM:   VS:  BP 102/62   Pulse (!) 55   Ht 5' 2.5" (1.588 m)   Wt 137 lb 1.9 oz (62.2 kg)   SpO2 99%   BMI 24.68 kg/m    GEN: Well nourished, well developed, in no acute distress HEENT: normal Neck: no JVD or masses Cardiac: RRR; soft flow murmur. No rubs, or gallops,no edema  Respiratory:  clear to auscultation bilaterally, normal work of breathing GI: soft, nontender, nondistended, + BS MS: no deformity or atrophy Skin: warm and dry, no rash Neuro:  Alert and Oriented x 3, Strength and sensation are intact Psych: euthymic mood, full affect   Wt Readings from Last 3 Encounters:   04/13/19 137 lb 1.9 oz (62.2 kg)  02/18/19 141 lb (64 kg)  12/15/18 143 lb (64.9 kg)     Studies/Labs Reviewed:   EKG:  EKG is NOT ordered today.  Recent Labs: 02/15/2019: BUN 17; Creatinine, Ser 1.03; Hemoglobin 12.5; Platelets 124.0; Potassium 4.0; Sodium 139   Lipid Panel No results found for: CHOL, TRIG, HDL, CHOLHDL, VLDL, LDLCALC, LDLDIRECT  Additional studies/ records that were reviewed today include:  TAVR OPERATIVE NOTE  Date of Procedure: 03/03/2018  Preoperative Diagnosis: Severe Aortic Stenosis  Postoperative Diagnosis: Same  Procedure:   Transcatheter Aortic Valve Replacement - Percutaneous Right Transfemoral Approach Edwards Sapien 3 THV (size 23 mm, model # 9600TFX, serial # [id number])  Co-Surgeons: Lauree Chandler, MD and Valentina Gu. Roxy Manns, MD  Pre-operative Echo Findings:   Severe aortic stenosis   Normal left ventricular systolic function Post-operative Echo Findings:   No paravalvular leak   Normal left ventricular systolic function  _____________   Echo 03/04/18 Study Conclusions - Left ventricle: The cavity size was normal. Wall thickness was   increased in a pattern of mild LVH. Systolic function was   vigorous. The estimated ejection fraction was in the range of 65%   to 70%. Wall motion was normal; there were no regional wall   motion abnormalities. Doppler parameters are consistent with   abnormal left ventricular relaxation (grade 1 diastolic   dysfunction). - Aortic valve: s/p Edwards Sapien 3 THV. No obstruction. Small   perivalvular leak (oriented toward the pulmonary artery). Mean   gradient (S): 16 mm Hg. Peak gradient (S): 38 mm Hg. Valve area   (VTI): 1.23 cm^2. Valve area (Vmax): 1 cm^2. Valve area (Vmean):   1.27 cm^2. - Mitral valve: Mildly thickened leaflets . Moderate posterior MAC.   Trivial regurgitation. Valve area by pressure half-time: 1.88   cm^2. - Left atrium: The atrium was normal in size. - Inferior vena  cava: The vessel was normal in size. The   respirophasic diameter changes were in the normal range (>= 50%),   consistent with normal central venous pressure. Impressions: - Compared to the echo yesterday, the LVEF is higher at 65-70%.   There is a small perivalvular leak. The TAVR valve gradients are  higher at 16 mmHg and 38 mmHg mean, respectively.  _____________  Echo 04/01/18 Study Conclusions - Left ventricle: The cavity size was normal. There was mild focal   basal hypertrophy of the septum. Systolic function was vigorous.   The estimated ejection fraction was in the range of 65% to 70%.   Wall motion was normal; there were no regional wall motion   abnormalities. Doppler parameters are consistent with abnormal   left ventricular relaxation (grade 1 diastolic dysfunction).   Longitudinal strain, 2D: -21.3 %. The calculated 3D left   ventricular ejection fraction was 72 % - Aortic valve: A prosthesis was present and functioning normally.   The prosthesis had a normal range of motion. The sewing ring   appeared normal, had no rocking motion, and showed no evidence of   dehiscence. There was moderate stenosis. There was mild   perivalvular regurgitation. Mean gradient (S): 30 mm Hg. Peak   gradient (S): 62 mm Hg. - Mitral valve: Calcified annulus. Mildly thickened leaflets . The   findings are consistent with mild stenosis. Mean gradient (D): 5   mm Hg. Valve area by pressure half-time: 1.64 cm^2. - Atrial septum: No defect or patent foramen ovale was identified. Impressions: - S/P TAVR. Perivalvular leak is mild and similar to prior.   Gradients from parasternal windows suggest mean gradient of 20   mmHg, up slightly from prior. However, Doppler from right sternal   border shows a mean gradient of 30 mmHg with a peak gradient of   62 mmHg and velocity of 3.9 m/s. Given the vigorous contraction   of the LV and the shape of the Doppler signal, this RSB   measurement may be an  LV/LVOT gradient. Recommend clinical   correlation, continued monitoring of gradient.  _____________  Echo 04/13/19  1. Left ventricular ejection fraction, by visual estimation, is 65 to 70%. The left ventricle has hyperdynamic function. There is no left ventricular hypertrophy.  2. The left ventricle has no regional wall motion abnormalities.  3. Global right ventricle has normal systolic function.The right ventricular size is normal. No increase in right ventricular wall thickness.  4. Left atrial size was moderately dilated.  5. Right atrial size was normal.  6. Moderate mitral annular calcification.  7. The mitral valve is normal in structure. No evidence of mitral valve regurgitation. No evidence of mitral stenosis.  8. The tricuspid valve is normal in structure. Tricuspid valve regurgitation is mild.  9. The aortic valve is normal in structure. Aortic valve regurgitation is trivial. No evidence of aortic valve sclerosis or stenosis. 10. TAVR- 03/03/2018, 94m Edwards Sapien 3. Peak velocity 3.116m, mean 2040m. 11. The pulmonic valve was normal in structure. Pulmonic valve regurgitation is trivial. 12. Normal pulmonary artery systolic pressure. 13. The inferior vena cava is normal in size with greater than 50% respiratory variability, suggesting right atrial pressure of 3 mmHg. 14. No significant change from prior study.  Aortic Valve: The aortic valve is normal in structure. Aortic valve regurgitation is trivial. The aortic valve is structurally normal, with no evidence of sclerosis or stenosis. Aortic valve mean gradient measures 18.8 mmHg. Aortic valve peak gradient measures 35.9 mmHg. Aortic valve area, by VTI measures 1.79 cm. TAVR- 03/03/2018, 25m71mwards Sapien 3. Peak velocity 3.40m/57mean 20mmH42m  ASSESSMENT & PLAN:   Severe AS s/p TAVR: echo today shows EF 65%, normally functioning TAVR with a mean gradient of 20mm H18md trivial PVL, both improved from previous  echos. She has NYHA class II symptoms, mostly fatigue. SBE prophylaxis discussed; she has amoxicillin. Continue on aspirin 61m daily indefinitely.   HTN: BP well controlled. Continue current regimen  HLD: continue statin   GERD: continue PPI  Pulmonary nodule: Solitary 3 mm solid pulmonary nodule noted on pre TAVR CT. No follow-up needed if patient is low-risk. Pt is a never smoker and low risk. No follow up neccessary.   Medication Adjustments/Labs and Tests Ordered: Current medicines are reviewed at length with the patient today.  Concerns regarding medicines are outlined above.  Medication changes, Labs and Tests ordered today are listed in the Patient Instructions below. Patient Instructions  Medication Instructions:  No changes *If you need a refill on your cardiac medications before your next appointment, please call your pharmacy*  Lab Work: none If you have labs (blood work) drawn today and your tests are completely normal, you will receive your results only by: .Marland KitchenMyChart Message (if you have MyChart) OR . A paper copy in the mail If you have any lab test that is abnormal or we need to change your treatment, we will call you to review the results.  Testing/Procedures: none  Follow-Up: [facility] our [facility].  As part of our continuing mission to provide you with exceptional heart care, we have created designated Provider Care Teams.  These Care Teams include your primary Cardiologist (physician) and Advanced Practice Providers (APPs -  Physician Assistants and Nurse Practitioners) who all work together to provide you with the care you need, when you need it.  Your next appointment:   6 month(s)  The format for your next appointment:   Either In Person or Virtual  Provider:   You will see Dr. RGeraldo Pitter  Or, you can be scheduled with the following Advanced Practice Provider on your designated Care Team [facility]:   CLaurann Montana FNP    Other Instructions Refilled amoxicillin --take 4 tablets (2000 mg) one hour prior to any dental procedures.     SMable Fill PA-C  04/13/2019 12:00 PM    [facility] [address] [place]   [id number]: [phone] Fax: [phone]

## 2019-04-13 ENCOUNTER — Other Ambulatory Visit: Payer: Self-pay

## 2019-04-13 ENCOUNTER — Ambulatory Visit (HOSPITAL_COMMUNITY): Payer: Medicare Other | Attending: Cardiology

## 2019-04-13 ENCOUNTER — Ambulatory Visit (INDEPENDENT_AMBULATORY_CARE_PROVIDER_SITE_OTHER): Payer: Medicare Other | Admitting: Physician Assistant

## 2019-04-13 ENCOUNTER — Encounter: Payer: Self-pay | Admitting: Gastroenterology

## 2019-04-13 ENCOUNTER — Encounter: Payer: Self-pay | Admitting: Physician Assistant

## 2019-04-13 VITALS — BP 102/62 | HR 55 | Ht 62.5 in | Wt 137.1 lb

## 2019-04-13 DIAGNOSIS — E782 Mixed hyperlipidemia: Secondary | ICD-10-CM

## 2019-04-13 DIAGNOSIS — E7800 Pure hypercholesterolemia, unspecified: Secondary | ICD-10-CM

## 2019-04-13 DIAGNOSIS — Z952 Presence of prosthetic heart valve: Secondary | ICD-10-CM

## 2019-04-13 DIAGNOSIS — I10 Essential (primary) hypertension: Secondary | ICD-10-CM | POA: Diagnosis not present

## 2019-04-13 DIAGNOSIS — R911 Solitary pulmonary nodule: Secondary | ICD-10-CM

## 2019-04-13 MED ORDER — AMOXICILLIN 500 MG PO CAPS: ORAL_CAPSULE | ORAL | 1 refills | Status: DC

## 2019-04-13 NOTE — Patient Instructions (Signed)
Medication Instructions:  No changes *If you need a refill on your cardiac medications before your next appointment, please call your pharmacy*  Lab Work: none If you have labs (blood work) drawn today and your tests are completely normal, you will receive your results only by: Marland Kitchen MyChart Message (if you have MyChart) OR . A paper copy in the mail If you have any lab test that is abnormal or we need to change your treatment, we will call you to review the results.  Testing/Procedures: none  Follow-Up: At [facility], you and your health needs are our priority.  As part of our continuing mission to provide you with exceptional heart care, we have created designated Provider Care Teams.  These Care Teams include your primary Cardiologist (physician) and Advanced Practice Providers (APPs -  Physician Assistants and Nurse Practitioners) who all work together to provide you with the care you need, when you need it.  Your next appointment:   6 month(s)  The format for your next appointment:   Either In Person or Virtual  Provider:   You will see Dr. Geraldo Pitter.  Or, you can be scheduled with the following Advanced Practice Provider on your designated Care Team (at our [facility]):  Laurann Montana, FNP    Other Instructions Refilled amoxicillin --take 4 tablets (2000 mg) one hour prior to any dental procedures.

## 2019-05-19 DIAGNOSIS — I10 Essential (primary) hypertension: Secondary | ICD-10-CM | POA: Diagnosis not present

## 2019-05-19 DIAGNOSIS — F419 Anxiety disorder, unspecified: Secondary | ICD-10-CM | POA: Diagnosis not present

## 2019-05-19 DIAGNOSIS — Z952 Presence of prosthetic heart valve: Secondary | ICD-10-CM | POA: Diagnosis not present

## 2019-05-19 DIAGNOSIS — E063 Autoimmune thyroiditis: Secondary | ICD-10-CM | POA: Diagnosis not present

## 2019-05-21 DIAGNOSIS — E785 Hyperlipidemia, unspecified: Secondary | ICD-10-CM | POA: Diagnosis not present

## 2019-05-21 DIAGNOSIS — E063 Autoimmune thyroiditis: Secondary | ICD-10-CM | POA: Diagnosis not present

## 2019-05-31 DIAGNOSIS — M5432 Sciatica, left side: Secondary | ICD-10-CM | POA: Diagnosis not present

## 2019-05-31 DIAGNOSIS — E663 Overweight: Secondary | ICD-10-CM | POA: Diagnosis not present

## 2019-05-31 DIAGNOSIS — N289 Disorder of kidney and ureter, unspecified: Secondary | ICD-10-CM | POA: Diagnosis not present

## 2019-05-31 DIAGNOSIS — D696 Thrombocytopenia, unspecified: Secondary | ICD-10-CM | POA: Diagnosis not present

## 2019-06-21 DIAGNOSIS — Z6825 Body mass index (BMI) 25.0-25.9, adult: Secondary | ICD-10-CM | POA: Diagnosis not present

## 2019-06-21 DIAGNOSIS — M5432 Sciatica, left side: Secondary | ICD-10-CM | POA: Diagnosis not present

## 2019-06-28 DIAGNOSIS — E538 Deficiency of other specified B group vitamins: Secondary | ICD-10-CM | POA: Diagnosis not present

## 2019-06-29 DIAGNOSIS — M5432 Sciatica, left side: Secondary | ICD-10-CM | POA: Diagnosis not present

## 2019-06-29 DIAGNOSIS — M5489 Other dorsalgia: Secondary | ICD-10-CM | POA: Diagnosis not present

## 2019-06-29 DIAGNOSIS — R2689 Other abnormalities of gait and mobility: Secondary | ICD-10-CM | POA: Diagnosis not present

## 2019-07-06 DIAGNOSIS — M5489 Other dorsalgia: Secondary | ICD-10-CM | POA: Diagnosis not present

## 2019-07-06 DIAGNOSIS — R2689 Other abnormalities of gait and mobility: Secondary | ICD-10-CM | POA: Diagnosis not present

## 2019-07-06 DIAGNOSIS — M5432 Sciatica, left side: Secondary | ICD-10-CM | POA: Diagnosis not present

## 2019-07-13 DIAGNOSIS — M5489 Other dorsalgia: Secondary | ICD-10-CM | POA: Diagnosis not present

## 2019-07-13 DIAGNOSIS — M5432 Sciatica, left side: Secondary | ICD-10-CM | POA: Diagnosis not present

## 2019-07-13 DIAGNOSIS — R2689 Other abnormalities of gait and mobility: Secondary | ICD-10-CM | POA: Diagnosis not present

## 2019-07-16 DIAGNOSIS — R2689 Other abnormalities of gait and mobility: Secondary | ICD-10-CM | POA: Diagnosis not present

## 2019-07-16 DIAGNOSIS — M5489 Other dorsalgia: Secondary | ICD-10-CM | POA: Diagnosis not present

## 2019-07-16 DIAGNOSIS — M5432 Sciatica, left side: Secondary | ICD-10-CM | POA: Diagnosis not present

## 2019-07-19 DIAGNOSIS — M5432 Sciatica, left side: Secondary | ICD-10-CM | POA: Diagnosis not present

## 2019-07-19 DIAGNOSIS — M5489 Other dorsalgia: Secondary | ICD-10-CM | POA: Diagnosis not present

## 2019-07-19 DIAGNOSIS — R2689 Other abnormalities of gait and mobility: Secondary | ICD-10-CM | POA: Diagnosis not present

## 2019-07-24 DIAGNOSIS — G5702 Lesion of sciatic nerve, left lower limb: Secondary | ICD-10-CM | POA: Diagnosis not present

## 2019-07-26 DIAGNOSIS — M5432 Sciatica, left side: Secondary | ICD-10-CM | POA: Diagnosis not present

## 2019-07-26 DIAGNOSIS — R2689 Other abnormalities of gait and mobility: Secondary | ICD-10-CM | POA: Diagnosis not present

## 2019-07-26 DIAGNOSIS — M5489 Other dorsalgia: Secondary | ICD-10-CM | POA: Diagnosis not present

## 2019-07-29 DIAGNOSIS — M5489 Other dorsalgia: Secondary | ICD-10-CM | POA: Diagnosis not present

## 2019-07-29 DIAGNOSIS — M5432 Sciatica, left side: Secondary | ICD-10-CM | POA: Diagnosis not present

## 2019-07-29 DIAGNOSIS — R2689 Other abnormalities of gait and mobility: Secondary | ICD-10-CM | POA: Diagnosis not present

## 2019-08-02 DIAGNOSIS — M5432 Sciatica, left side: Secondary | ICD-10-CM | POA: Diagnosis not present

## 2019-08-02 DIAGNOSIS — M5489 Other dorsalgia: Secondary | ICD-10-CM | POA: Diagnosis not present

## 2019-08-02 DIAGNOSIS — R2689 Other abnormalities of gait and mobility: Secondary | ICD-10-CM | POA: Diagnosis not present

## 2019-08-05 DIAGNOSIS — R2689 Other abnormalities of gait and mobility: Secondary | ICD-10-CM | POA: Diagnosis not present

## 2019-08-05 DIAGNOSIS — M5489 Other dorsalgia: Secondary | ICD-10-CM | POA: Diagnosis not present

## 2019-08-05 DIAGNOSIS — M5432 Sciatica, left side: Secondary | ICD-10-CM | POA: Diagnosis not present

## 2019-08-10 DIAGNOSIS — M5432 Sciatica, left side: Secondary | ICD-10-CM | POA: Diagnosis not present

## 2019-08-10 DIAGNOSIS — M5489 Other dorsalgia: Secondary | ICD-10-CM | POA: Diagnosis not present

## 2019-08-10 DIAGNOSIS — R2689 Other abnormalities of gait and mobility: Secondary | ICD-10-CM | POA: Diagnosis not present

## 2019-08-11 DIAGNOSIS — R2689 Other abnormalities of gait and mobility: Secondary | ICD-10-CM | POA: Diagnosis not present

## 2019-08-11 DIAGNOSIS — M5432 Sciatica, left side: Secondary | ICD-10-CM | POA: Diagnosis not present

## 2019-08-11 DIAGNOSIS — M5489 Other dorsalgia: Secondary | ICD-10-CM | POA: Diagnosis not present

## 2019-08-16 DIAGNOSIS — R2689 Other abnormalities of gait and mobility: Secondary | ICD-10-CM | POA: Diagnosis not present

## 2019-08-16 DIAGNOSIS — M5489 Other dorsalgia: Secondary | ICD-10-CM | POA: Diagnosis not present

## 2019-08-16 DIAGNOSIS — M5432 Sciatica, left side: Secondary | ICD-10-CM | POA: Diagnosis not present

## 2019-08-18 DIAGNOSIS — R2689 Other abnormalities of gait and mobility: Secondary | ICD-10-CM | POA: Diagnosis not present

## 2019-08-18 DIAGNOSIS — M5432 Sciatica, left side: Secondary | ICD-10-CM | POA: Diagnosis not present

## 2019-08-18 DIAGNOSIS — M5489 Other dorsalgia: Secondary | ICD-10-CM | POA: Diagnosis not present

## 2019-08-23 DIAGNOSIS — M5489 Other dorsalgia: Secondary | ICD-10-CM | POA: Diagnosis not present

## 2019-08-23 DIAGNOSIS — M5432 Sciatica, left side: Secondary | ICD-10-CM | POA: Diagnosis not present

## 2019-08-23 DIAGNOSIS — R2689 Other abnormalities of gait and mobility: Secondary | ICD-10-CM | POA: Diagnosis not present

## 2019-08-24 DIAGNOSIS — M4726 Other spondylosis with radiculopathy, lumbar region: Secondary | ICD-10-CM | POA: Diagnosis not present

## 2019-08-24 DIAGNOSIS — M5136 Other intervertebral disc degeneration, lumbar region: Secondary | ICD-10-CM | POA: Diagnosis not present

## 2019-08-24 DIAGNOSIS — Z6825 Body mass index (BMI) 25.0-25.9, adult: Secondary | ICD-10-CM | POA: Diagnosis not present

## 2019-08-24 DIAGNOSIS — M47896 Other spondylosis, lumbar region: Secondary | ICD-10-CM | POA: Diagnosis not present

## 2019-08-24 DIAGNOSIS — M545 Low back pain: Secondary | ICD-10-CM | POA: Diagnosis not present

## 2019-08-25 DIAGNOSIS — R2689 Other abnormalities of gait and mobility: Secondary | ICD-10-CM | POA: Diagnosis not present

## 2019-08-25 DIAGNOSIS — M5489 Other dorsalgia: Secondary | ICD-10-CM | POA: Diagnosis not present

## 2019-08-25 DIAGNOSIS — M5432 Sciatica, left side: Secondary | ICD-10-CM | POA: Diagnosis not present

## 2019-09-01 DIAGNOSIS — M5432 Sciatica, left side: Secondary | ICD-10-CM | POA: Diagnosis not present

## 2019-09-01 DIAGNOSIS — M5489 Other dorsalgia: Secondary | ICD-10-CM | POA: Diagnosis not present

## 2019-09-01 DIAGNOSIS — R2689 Other abnormalities of gait and mobility: Secondary | ICD-10-CM | POA: Diagnosis not present

## 2019-09-08 DIAGNOSIS — K219 Gastro-esophageal reflux disease without esophagitis: Secondary | ICD-10-CM | POA: Insufficient documentation

## 2019-09-08 DIAGNOSIS — M5442 Lumbago with sciatica, left side: Secondary | ICD-10-CM | POA: Diagnosis not present

## 2019-09-08 DIAGNOSIS — G8929 Other chronic pain: Secondary | ICD-10-CM

## 2019-09-08 DIAGNOSIS — I10 Essential (primary) hypertension: Secondary | ICD-10-CM | POA: Diagnosis not present

## 2019-09-08 DIAGNOSIS — F321 Major depressive disorder, single episode, moderate: Secondary | ICD-10-CM

## 2019-09-08 DIAGNOSIS — R5381 Other malaise: Secondary | ICD-10-CM | POA: Diagnosis not present

## 2019-09-08 DIAGNOSIS — R5383 Other fatigue: Secondary | ICD-10-CM

## 2019-09-08 DIAGNOSIS — E782 Mixed hyperlipidemia: Secondary | ICD-10-CM | POA: Diagnosis not present

## 2019-09-08 HISTORY — DX: Other fatigue: R53.81

## 2019-09-08 HISTORY — DX: Other chronic pain: G89.29

## 2019-09-08 HISTORY — DX: Major depressive disorder, single episode, moderate: F32.1

## 2019-09-08 HISTORY — DX: Gastro-esophageal reflux disease without esophagitis: K21.9

## 2019-09-08 HISTORY — DX: Other malaise: R53.83

## 2019-09-14 DIAGNOSIS — M5136 Other intervertebral disc degeneration, lumbar region: Secondary | ICD-10-CM | POA: Diagnosis not present

## 2019-09-14 DIAGNOSIS — M4317 Spondylolisthesis, lumbosacral region: Secondary | ICD-10-CM | POA: Diagnosis not present

## 2019-09-14 DIAGNOSIS — Z6825 Body mass index (BMI) 25.0-25.9, adult: Secondary | ICD-10-CM | POA: Diagnosis not present

## 2019-09-14 DIAGNOSIS — M4726 Other spondylosis with radiculopathy, lumbar region: Secondary | ICD-10-CM | POA: Diagnosis not present

## 2019-09-15 ENCOUNTER — Other Ambulatory Visit: Payer: Self-pay | Admitting: Orthopaedic Surgery

## 2019-09-15 DIAGNOSIS — M4726 Other spondylosis with radiculopathy, lumbar region: Secondary | ICD-10-CM

## 2019-10-04 ENCOUNTER — Other Ambulatory Visit: Payer: Self-pay

## 2019-10-05 ENCOUNTER — Encounter: Payer: Self-pay | Admitting: Cardiology

## 2019-10-05 ENCOUNTER — Ambulatory Visit (INDEPENDENT_AMBULATORY_CARE_PROVIDER_SITE_OTHER): Payer: Medicare Other | Admitting: Cardiology

## 2019-10-05 ENCOUNTER — Other Ambulatory Visit: Payer: Self-pay

## 2019-10-05 DIAGNOSIS — Z952 Presence of prosthetic heart valve: Secondary | ICD-10-CM

## 2019-10-05 DIAGNOSIS — E7800 Pure hypercholesterolemia, unspecified: Secondary | ICD-10-CM | POA: Diagnosis not present

## 2019-10-05 DIAGNOSIS — N289 Disorder of kidney and ureter, unspecified: Secondary | ICD-10-CM

## 2019-10-05 DIAGNOSIS — I10 Essential (primary) hypertension: Secondary | ICD-10-CM | POA: Diagnosis not present

## 2019-10-05 NOTE — Progress Notes (Signed)
Cardiology Office Note:    Date:  10/05/2019   ID:  Shannon Mills, DOB May 27, 1940, MRN [id number]  PCP:  Gweneth Fritter, FNP  Cardiologist:  Jenean Lindau, MD   Referring MD: No ref. provider found    ASSESSMENT:    1. Essential hypertension   2. Hypercholesterolemia   3. S/P TAVR (transcatheter aortic valve replacement)   4. Renal insufficiency    PLAN:    In order of problems listed above:  1. Primary prevention stressed with patient.  Importance of compliance with diet medication stressed and she vocalized understanding.  She is trying to do her best to be mobile and active. 2. Essential hypertension: Blood pressure is stable and she monitors regularly at home 3. Mixed dyslipidemia: Lipids managed by primary care physician and she was told that they were fine.  I reviewed blood work done by her primary care physician and lipids are fine. 4. Renal insufficiency: Stable at this time managed by primary care. 5. Post TAVR: Echocardiogram from December was reviewed with the patient at length.  This is stable 6. Patient will be seen in follow-up appointment in 6 months or earlier if the patient has any concerns    Medication Adjustments/Labs and Tests Ordered: Current medicines are reviewed at length with the patient today.  Concerns regarding medicines are outlined above.  No orders of the defined types were placed in this encounter.  No orders of the defined types were placed in this encounter.    Chief Complaint  Patient presents with  . Follow-up     History of Present Illness:    Shannon Mills is a 79 y.o. female.  Patient has past medical history of essential hypertension and post TAVR surgery for aortic valve.  She is doing fine.  She denies any chest pain orthopnea or PND.  She is not ambulating much because of orthopedic issues affecting her hip.  At the time of my evaluation, the patient is alert awake oriented and in no distress.  Past Medical History:    Diagnosis Date  . Anxiety   . Chronic reflux esophagitis   . Hiatal hernia   . Hx of colonic polyp   . Hypercholesterolemia   . Hypertension   . Hypothyroidism   . Interstitial cystitis   . Osteoporosis   . S/P TAVR (transcatheter aortic valve replacement)    23 mm Edwards Sapien 3 transcatheter heart valve placed via percutaneous right transfemoral approach   . Severe aortic stenosis   . Vitamin D deficiency     Past Surgical History:  Procedure Laterality Date  . CHOLECYSTECTOMY    . COLONOSCOPY  09/18/2011   Colonic polyp, status post polypectomy. Mild sigmoid diverticulosis. Otherwise grossly normal colonoscopy to terminal ileum   . COLONOSCOPY WITH PROPOFOL N/A 02/18/2019   Procedure: COLONOSCOPY WITH PROPOFOL;  Surgeon: Rush Landmark Telford Nab., MD;  Location: [place];  Service: Gastroenterology;  Laterality: N/A;  . ENDOSCOPIC MUCOSAL RESECTION N/A 02/18/2019   Procedure: ENDOSCOPIC MUCOSAL RESECTION;  Surgeon: Rush Landmark Telford Nab., MD;  Location: [place];  Service: Gastroenterology;  Laterality: N/A;  . FOOT SURGERY    . HEMOSTASIS CLIP PLACEMENT  02/18/2019   Procedure: HEMOSTASIS CLIP PLACEMENT;  Surgeon: Irving Copas., MD;  Location: [facility] ENDOSCOPY;  Service: Gastroenterology;;  . INTRAOPERATIVE TRANSTHORACIC ECHOCARDIOGRAM N/A 03/03/2018   Procedure: INTRAOPERATIVE TRANSTHORACIC ECHOCARDIOGRAM;  Surgeon: Burnell Blanks, MD;  Location: [place];  Service: Open Heart Surgery;  Laterality: N/A;  . POLYPECTOMY  02/18/2019   Procedure: POLYPECTOMY;  Surgeon: Rush Landmark Telford Nab., MD;  Location: [place];  Service: Gastroenterology;;  . RIGHT/LEFT HEART CATH AND CORONARY ANGIOGRAPHY N/A 02/03/2018   Procedure: RIGHT/LEFT HEART CATH AND CORONARY ANGIOGRAPHY;  Surgeon: Martinique, Peter M, MD;  Location: [place] [facility];  Service: Cardiovascular;  Laterality: N/A;  . SUBMUCOSAL LIFTING INJECTION  02/18/2019   Procedure: SUBMUCOSAL LIFTING  INJECTION;  Surgeon: Irving Copas., MD;  Location: [place];  Service: Gastroenterology;;  . TRANSCATHETER AORTIC VALVE REPLACEMENT, TRANSFEMORAL  03/03/2018  . TRANSCATHETER AORTIC VALVE REPLACEMENT, TRANSFEMORAL N/A 03/03/2018   Procedure: TRANSCATHETER AORTIC VALVE REPLACEMENT, TRANSFEMORAL;  Surgeon: Burnell Blanks, MD;  Location: [place];  Service: Open Heart Surgery;  Laterality: N/A;  . TUBAL LIGATION      Current Medications: Current Meds  Medication Sig  . acetaminophen (TYLENOL) 500 MG tablet Take 1 tablet by mouth every 6 (six) hours as needed.  . AMBULATORY NON FORMULARY MEDICATION Medication Name: Gi Cocktail Take 5-10 cc every 6-8 hours as needed.  Marland Kitchen amoxicillin (AMOXIL) 500 MG capsule Take 4 tablets (2000 mg) by mouth 60 min prior to any dental procedures  . aspirin EC 81 MG tablet Take 81 mg by mouth daily.  Marland Kitchen b complex vitamins capsule Take 1 capsule by mouth daily.  . cetirizine (ZYRTEC) 10 MG tablet Take 10 mg by mouth at bedtime.  . Cholecalciferol (VITAMIN D3) 50 MCG (2000 UT) TABS Take 2,000 Units by mouth daily.  Marland Kitchen escitalopram (LEXAPRO) 10 MG tablet Take 10 mg by mouth at bedtime.   . gabapentin (NEURONTIN) 300 MG capsule Take 300 mg by mouth 3 (three) times daily.  Marland Kitchen ibuprofen (ADVIL) 200 MG tablet Take 1 tablet by mouth every 6 (six) hours as needed.  Marland Kitchen levothyroxine (SYNTHROID, LEVOTHROID) 50 MCG tablet Take 50 mcg by mouth daily before breakfast.  . metoprolol succinate (TOPROL XL) 25 MG 24 hr tablet Take 1 tablet (25 mg total) by mouth daily. (Patient taking differently: Take 25 mg by mouth every evening. )  . pantoprazole (PROTONIX) 40 MG tablet Take 1 tablet (40 mg total) by mouth daily. (Patient taking differently: Take 40 mg by mouth at bedtime. )  . rosuvastatin (CRESTOR) 40 MG tablet Take 40 mg by mouth at bedtime.   Marland Kitchen tiZANidine (ZANAFLEX) 4 MG tablet Take 1 tablet by mouth daily.     Allergies:   Wellbutrin [bupropion] and Levaquin  [levofloxacin]   Social History   Socioeconomic History  . Marital status: Married    Spouse name: Not on file  . Number of children: 2  . Years of education: Not on file  . Highest education level: Not on file  Occupational History  . Occupation: Nutritional therapist  Tobacco Use  . Smoking status: Never Smoker  . Smokeless tobacco: Never Used  Vaping Use  . Vaping Use: Never used  Substance and Sexual Activity  . Alcohol use: No  . Drug use: No  . Sexual activity: Not on file  Other Topics Concern  . Not on file  Social History Narrative  . Not on file   Social Determinants of Health   Financial Resource Strain:   . Difficulty of Paying Living Expenses:   Food Insecurity:   . Worried About Charity fundraiser in the Last Year:   . Arboriculturist in the Last Year:   Transportation Needs:   . Film/video editor (Medical):   Marland Kitchen Lack of Transportation (Non-Medical):  Physical Activity:   . Days of Exercise per Week:   . Minutes of Exercise per Session:   Stress:   . Feeling of Stress :   Social Connections:   . Frequency of Communication with Friends and Family:   . Frequency of Social Gatherings with Friends and Family:   . Attends Religious Services:   . Active Member of Clubs or Organizations:   . Attends Archivist Meetings:   Marland Kitchen Marital Status:      Family History: The patient's family history includes COPD in her father; Dementia in her mother; Throat cancer in her brother. There is no history of Colon cancer, Esophageal cancer, Inflammatory bowel disease, Liver disease, Pancreatic cancer, Rectal cancer, or Stomach cancer.  ROS:   Please see the history of present illness.    All other systems reviewed and are negative.  EKGs/Labs/Other Studies Reviewed:    The following studies were reviewed today: I discussed my findings with the patient at length   Recent Labs: 02/15/2019: BUN 17; Creatinine, Ser 1.03; Hemoglobin 12.5; Platelets  124.0; Potassium 4.0; Sodium 139  Recent Lipid Panel No results found for: CHOL, TRIG, HDL, CHOLHDL, VLDL, LDLCALC, LDLDIRECT  Physical Exam:    VS:  BP (!) 150/80   Pulse (!) 59   Ht 5' 2.5" (1.588 m)   Wt 138 lb (62.6 kg)   SpO2 97%   BMI 24.84 kg/m     Wt Readings from Last 3 Encounters:  10/05/19 138 lb (62.6 kg)  04/13/19 137 lb 1.9 oz (62.2 kg)  02/18/19 141 lb (64 kg)     GEN: Patient is in no acute distress HEENT: Normal NECK: No JVD; No carotid bruits LYMPHATICS: No lymphadenopathy CARDIAC: Hear sounds regular, 2/6 systolic murmur at the apex. RESPIRATORY:  Clear to auscultation without rales, wheezing or rhonchi  ABDOMEN: Soft, non-tender, non-distended MUSCULOSKELETAL:  No edema; No deformity  SKIN: Warm and dry NEUROLOGIC:  Alert and oriented x 3 PSYCHIATRIC:  Normal affect   Signed, Jenean Lindau, MD  10/05/2019 11:15 AM    [place]

## 2019-10-05 NOTE — Patient Instructions (Signed)

## 2019-10-19 ENCOUNTER — Ambulatory Visit
Admission: RE | Admit: 2019-10-19 | Discharge: 2019-10-19 | Disposition: A | Discharge status: Home / Self Care | Payer: Medicare Other | Source: Ambulatory Visit | Attending: Orthopaedic Surgery | Admitting: Orthopaedic Surgery

## 2019-10-19 ENCOUNTER — Other Ambulatory Visit: Payer: Self-pay

## 2019-10-19 DIAGNOSIS — M4726 Other spondylosis with radiculopathy, lumbar region: Secondary | ICD-10-CM

## 2019-11-02 DIAGNOSIS — M5416 Radiculopathy, lumbar region: Secondary | ICD-10-CM | POA: Diagnosis not present

## 2019-11-16 DIAGNOSIS — Z6825 Body mass index (BMI) 25.0-25.9, adult: Secondary | ICD-10-CM | POA: Diagnosis not present

## 2019-11-16 DIAGNOSIS — M545 Low back pain: Secondary | ICD-10-CM | POA: Diagnosis not present

## 2019-11-16 DIAGNOSIS — M5136 Other intervertebral disc degeneration, lumbar region: Secondary | ICD-10-CM | POA: Diagnosis not present

## 2019-11-16 DIAGNOSIS — M5416 Radiculopathy, lumbar region: Secondary | ICD-10-CM | POA: Diagnosis not present

## 2019-11-16 DIAGNOSIS — M4317 Spondylolisthesis, lumbosacral region: Secondary | ICD-10-CM | POA: Diagnosis not present

## 2019-11-22 DIAGNOSIS — M5416 Radiculopathy, lumbar region: Secondary | ICD-10-CM | POA: Diagnosis not present

## 2019-12-07 DIAGNOSIS — M4317 Spondylolisthesis, lumbosacral region: Secondary | ICD-10-CM | POA: Diagnosis not present

## 2019-12-07 DIAGNOSIS — Z6825 Body mass index (BMI) 25.0-25.9, adult: Secondary | ICD-10-CM | POA: Diagnosis not present

## 2019-12-07 DIAGNOSIS — M5416 Radiculopathy, lumbar region: Secondary | ICD-10-CM | POA: Diagnosis not present

## 2019-12-07 DIAGNOSIS — M5136 Other intervertebral disc degeneration, lumbar region: Secondary | ICD-10-CM | POA: Diagnosis not present

## 2019-12-22 HISTORY — PX: BACK SURGERY: SHX140

## 2019-12-23 DIAGNOSIS — M5432 Sciatica, left side: Secondary | ICD-10-CM | POA: Diagnosis not present

## 2019-12-23 DIAGNOSIS — M5116 Intervertebral disc disorders with radiculopathy, lumbar region: Secondary | ICD-10-CM | POA: Diagnosis not present

## 2019-12-23 DIAGNOSIS — M4726 Other spondylosis with radiculopathy, lumbar region: Secondary | ICD-10-CM | POA: Diagnosis not present

## 2019-12-31 ENCOUNTER — Telehealth: Payer: Self-pay

## 2019-12-31 NOTE — Telephone Encounter (Signed)
   [facility] Pre-operative Risk Assessment    HEARTCARE STAFF: - Please ensure there is not already an duplicate clearance open for this procedure. - Under Visit Info/Reason for Call, type in Other and utilize the format Clearance MM/DD/YY or Clearance TBD. Do not use dashes or single digits. - If request is for dental extraction, please clarify the # of teeth to be extracted.  Request for surgical clearance:  1. What type of surgery is being performed? Bilateral L4-L5 Hemilaminectomy. Left sided partial Discectomy.    2. When is this surgery scheduled? TBD   3. What type of clearance is required (medical clearance vs. Pharmacy clearance to hold med vs. Both)? Both  4. Are there any medications that need to be held prior to surgery and how long? Aspirin for 7 days   5. Practice name and name of physician performing surgery? [facility]. Dr. Ivan Croft  6. What is the office phone number? [phone]   7.   What is the office fax number? [phone]  8.   Anesthesia type (None, local, MAC, general) ? General   Gita Kudo 12/31/2019, 11:37 AM  _________________________________________________________________   (provider comments below)

## 2020-01-04 NOTE — Telephone Encounter (Signed)
   Primary Cardiologist: No primary care provider on file.  Chart reviewed as part of pre-operative protocol coverage. Patient was contacted 01/04/2020 in reference to pre-operative risk assessment for pending surgery as outlined below.  VIA ROSADO was last seen on 10/05/2019 by Dr. Geraldo Pitter.  Since that day, Shannon Mills has done well without chest pain or shortness of breath. Pre-TAVR cath in 01/2018 showed normal coronary arteries. Recent echo in 03/2019 showed normal EF with stable TAVR.   Therefore, based on ACC/AHA guidelines, the patient would be at acceptable risk for the planned procedure without further cardiovascular testing.   The patient was advised that if she develops new symptoms prior to surgery to contact our office to arrange for a follow-up visit, and she verbalized understanding.  I will route this recommendation to the requesting party via Epic fax function and remove from pre-op pool. Please call with questions.  She may hold aspirin for 7 days prior to the surgery and restart as soon as possible after the procedure at the surgeon's discretion.   [place], [place] 01/04/2020, 9:58 AM

## 2020-01-07 DIAGNOSIS — Z4689 Encounter for fitting and adjustment of other specified devices: Secondary | ICD-10-CM | POA: Diagnosis not present

## 2020-01-07 DIAGNOSIS — Z01812 Encounter for preprocedural laboratory examination: Secondary | ICD-10-CM | POA: Diagnosis not present

## 2020-01-07 DIAGNOSIS — M4726 Other spondylosis with radiculopathy, lumbar region: Secondary | ICD-10-CM | POA: Diagnosis not present

## 2020-01-07 DIAGNOSIS — M5432 Sciatica, left side: Secondary | ICD-10-CM | POA: Diagnosis not present

## 2020-01-07 DIAGNOSIS — Z20822 Contact with and (suspected) exposure to covid-19: Secondary | ICD-10-CM | POA: Diagnosis not present

## 2020-01-07 DIAGNOSIS — M5116 Intervertebral disc disorders with radiculopathy, lumbar region: Secondary | ICD-10-CM | POA: Diagnosis not present

## 2020-01-07 DIAGNOSIS — K219 Gastro-esophageal reflux disease without esophagitis: Secondary | ICD-10-CM | POA: Diagnosis not present

## 2020-01-12 DIAGNOSIS — M5432 Sciatica, left side: Secondary | ICD-10-CM | POA: Diagnosis not present

## 2020-01-12 DIAGNOSIS — M47816 Spondylosis without myelopathy or radiculopathy, lumbar region: Secondary | ICD-10-CM | POA: Diagnosis not present

## 2020-01-12 DIAGNOSIS — Z981 Arthrodesis status: Secondary | ICD-10-CM | POA: Diagnosis not present

## 2020-01-12 DIAGNOSIS — M47896 Other spondylosis, lumbar region: Secondary | ICD-10-CM | POA: Diagnosis not present

## 2020-01-12 DIAGNOSIS — M5116 Intervertebral disc disorders with radiculopathy, lumbar region: Secondary | ICD-10-CM

## 2020-01-12 DIAGNOSIS — M5126 Other intervertebral disc displacement, lumbar region: Secondary | ICD-10-CM | POA: Diagnosis not present

## 2020-01-12 DIAGNOSIS — M48061 Spinal stenosis, lumbar region without neurogenic claudication: Secondary | ICD-10-CM | POA: Diagnosis not present

## 2020-01-12 DIAGNOSIS — M4726 Other spondylosis with radiculopathy, lumbar region: Secondary | ICD-10-CM | POA: Diagnosis not present

## 2020-01-12 HISTORY — DX: Intervertebral disc disorders with radiculopathy, lumbar region: M51.16

## 2020-02-10 DIAGNOSIS — M545 Low back pain, unspecified: Secondary | ICD-10-CM | POA: Diagnosis not present

## 2020-02-15 DIAGNOSIS — E785 Hyperlipidemia, unspecified: Secondary | ICD-10-CM | POA: Diagnosis not present

## 2020-02-15 DIAGNOSIS — Z23 Encounter for immunization: Secondary | ICD-10-CM | POA: Diagnosis not present

## 2020-02-15 DIAGNOSIS — R5381 Other malaise: Secondary | ICD-10-CM | POA: Diagnosis not present

## 2020-02-15 DIAGNOSIS — R5383 Other fatigue: Secondary | ICD-10-CM | POA: Diagnosis not present

## 2020-02-15 DIAGNOSIS — I10 Essential (primary) hypertension: Secondary | ICD-10-CM | POA: Diagnosis not present

## 2020-02-15 DIAGNOSIS — E782 Mixed hyperlipidemia: Secondary | ICD-10-CM | POA: Diagnosis not present

## 2020-04-05 ENCOUNTER — Other Ambulatory Visit: Payer: Self-pay

## 2020-04-05 DIAGNOSIS — N3010 Interstitial cystitis (chronic) without hematuria: Secondary | ICD-10-CM | POA: Insufficient documentation

## 2020-04-05 DIAGNOSIS — E559 Vitamin D deficiency, unspecified: Secondary | ICD-10-CM | POA: Insufficient documentation

## 2020-04-05 DIAGNOSIS — K449 Diaphragmatic hernia without obstruction or gangrene: Secondary | ICD-10-CM | POA: Insufficient documentation

## 2020-04-05 DIAGNOSIS — Z860100 Personal history of colon polyps, unspecified: Secondary | ICD-10-CM | POA: Insufficient documentation

## 2020-04-05 DIAGNOSIS — I350 Nonrheumatic aortic (valve) stenosis: Secondary | ICD-10-CM | POA: Insufficient documentation

## 2020-04-05 DIAGNOSIS — F419 Anxiety disorder, unspecified: Secondary | ICD-10-CM | POA: Insufficient documentation

## 2020-04-05 DIAGNOSIS — M810 Age-related osteoporosis without current pathological fracture: Secondary | ICD-10-CM | POA: Insufficient documentation

## 2020-04-06 ENCOUNTER — Ambulatory Visit (INDEPENDENT_AMBULATORY_CARE_PROVIDER_SITE_OTHER): Payer: Medicare Other | Admitting: Cardiology

## 2020-04-06 ENCOUNTER — Encounter: Payer: Self-pay | Admitting: Cardiology

## 2020-04-06 ENCOUNTER — Other Ambulatory Visit: Payer: Self-pay

## 2020-04-06 VITALS — BP 128/62 | HR 64 | Ht 62.0 in | Wt 131.0 lb

## 2020-04-06 DIAGNOSIS — E7800 Pure hypercholesterolemia, unspecified: Secondary | ICD-10-CM | POA: Diagnosis not present

## 2020-04-06 DIAGNOSIS — N289 Disorder of kidney and ureter, unspecified: Secondary | ICD-10-CM

## 2020-04-06 DIAGNOSIS — I10 Essential (primary) hypertension: Secondary | ICD-10-CM

## 2020-04-06 DIAGNOSIS — Z952 Presence of prosthetic heart valve: Secondary | ICD-10-CM | POA: Diagnosis not present

## 2020-04-06 NOTE — Patient Instructions (Signed)

## 2020-04-06 NOTE — Progress Notes (Signed)
Cardiology Office Note:    Date:  04/06/2020   ID:  ALYCIA COOPERWOOD, DOB 02/10/1941, MRN [id number]  PCP:  Gweneth Fritter, FNP  Cardiologist:  Jenean Lindau, MD   Referring MD: Gweneth Fritter, FNP    ASSESSMENT:    1. Essential hypertension   2. Hypercholesterolemia   3. S/P TAVR (transcatheter aortic valve replacement)   4. Renal insufficiency    PLAN:    In order of problems listed above:  1. Primary prevention stressed with the patient.  Importance of compliance with diet medication stressed and she vocalized understanding. 2. Aortic valve stenosis post TAVR procedure: Stable at this time and I mentioned to the patient findings with the last echocardiogram. 3. Essential hypertension: Blood pressure stable and diet was emphasized 4. Mixed dyslipidemia.  Lab work reviewed and I discussed with the patient findings and diet and she is compliant with it. 5. Patient will be seen in follow-up appointment in 6 months or earlier if the patient has any concerns    Medication Adjustments/Labs and Tests Ordered: Current medicines are reviewed at length with the patient today.  Concerns regarding medicines are outlined above.  No orders of the defined types were placed in this encounter.  No orders of the defined types were placed in this encounter.    No chief complaint on file.    History of Present Illness:    ARTEMISA SLADEK is a 79 y.o. female.  Patient has past medical history of aortic stenosis post aortic valve replacement with TAVR procedure, essential hypertension and dyslipidemia.  She denies any problems at this time and takes care of activities of daily living.  She ambulates age appropriately.  She cannot do much because of orthopedic issues involving her back for which she had back surgery a few weeks ago.  At the time of my evaluation, the patient is alert awake oriented and in no distress.  Past Medical History:  Diagnosis Date  . Abnormal colonoscopy  12/15/2018  . Adenomatous polyp of ascending colon 12/15/2018  . Anxiety   . Chest pain 06/08/2018  . Chronic left-sided low back pain with left-sided sciatica 09/08/2019  . Chronic reflux esophagitis   . Essential hypertension 01/29/2018  . Gastroesophageal reflux disease without esophagitis 09/08/2019  . Hiatal hernia   . History of colonic polyps 12/15/2018  . Hx of colonic polyp   . Hypercholesterolemia   . Hypertension   . Hyponatremia 06/10/2018  . Hypothyroidism   . Interstitial cystitis   . Interstitial cystitis (chronic) without hematuria 06/19/2015  . 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 Malaise and fatigue 09/08/2019  . Moderate major depression ([place]) 09/08/2019  . Osteoporosis   . Renal insufficiency 06/10/2018  . S/P TAVR (transcatheter aortic valve replacement)    23 mm Edwards Sapien 3 transcatheter heart valve placed via percutaneous right transfemoral approach   . Severe aortic stenosis   . Vitamin D deficiency     Past Surgical History:  Procedure Laterality Date  . CHOLECYSTECTOMY    . COLONOSCOPY  09/18/2011   Colonic polyp, status post polypectomy. Mild sigmoid diverticulosis. Otherwise grossly normal colonoscopy to terminal ileum   . COLONOSCOPY WITH PROPOFOL N/A 02/18/2019   Procedure: COLONOSCOPY WITH PROPOFOL;  Surgeon: Rush Landmark Telford Nab., MD;  Location: [place];  Service: Gastroenterology;  Laterality: N/A;  . ENDOSCOPIC MUCOSAL RESECTION N/A 02/18/2019   Procedure: ENDOSCOPIC MUCOSAL RESECTION;  Surgeon: Rush Landmark Telford Nab., MD;  Location: [place];  Service: Gastroenterology;  Laterality: N/A;  . FOOT SURGERY    . HEMOSTASIS CLIP PLACEMENT  02/18/2019   Procedure: HEMOSTASIS CLIP PLACEMENT;  Surgeon: Irving Copas., MD;  Location: [facility] ENDOSCOPY;  Service: Gastroenterology;;  . INTRAOPERATIVE TRANSTHORACIC ECHOCARDIOGRAM N/A 03/03/2018   Procedure: INTRAOPERATIVE TRANSTHORACIC ECHOCARDIOGRAM;  Surgeon: Burnell Blanks, MD;  Location: [place];  Service: Open Heart Surgery;  Laterality: N/A;  . POLYPECTOMY  02/18/2019   Procedure: POLYPECTOMY;  Surgeon: Rush Landmark Telford Nab., MD;  Location: [place];  Service: Gastroenterology;;  . RIGHT/LEFT HEART CATH AND CORONARY ANGIOGRAPHY N/A 02/03/2018   Procedure: RIGHT/LEFT HEART CATH AND CORONARY ANGIOGRAPHY;  Surgeon: Martinique, Peter M, MD;  Location: [place] [facility];  Service: Cardiovascular;  Laterality: N/A;  . SUBMUCOSAL LIFTING INJECTION  02/18/2019   Procedure: SUBMUCOSAL LIFTING INJECTION;  Surgeon: Irving Copas., MD;  Location: [place];  Service: Gastroenterology;;  . TRANSCATHETER AORTIC VALVE REPLACEMENT, TRANSFEMORAL  03/03/2018  . TRANSCATHETER AORTIC VALVE REPLACEMENT, TRANSFEMORAL N/A 03/03/2018   Procedure: TRANSCATHETER AORTIC VALVE REPLACEMENT, TRANSFEMORAL;  Surgeon: Burnell Blanks, MD;  Location: [place];  Service: Open Heart Surgery;  Laterality: N/A;  . TUBAL LIGATION      Current Medications: Current Meds  Medication Sig  . acetaminophen (TYLENOL) 500 MG tablet Take 1 tablet by mouth every 6 (six) hours as needed.  . AMBULATORY NON FORMULARY MEDICATION Medication Name: Gi Cocktail Take 5-10 cc every 6-8 hours as needed.  Marland Kitchen aspirin EC 81 MG tablet Take 81 mg by mouth daily.  Marland Kitchen b complex vitamins capsule Take 1 capsule by mouth daily.  . cetirizine (ZYRTEC) 10 MG tablet Take 10 mg by mouth at bedtime.  Marland Kitchen escitalopram (LEXAPRO) 20 MG tablet Take 20 mg by mouth daily.  Marland Kitchen ibuprofen (ADVIL) 200 MG tablet Take 1 tablet by mouth every 6 (six) hours as needed.  Marland Kitchen levothyroxine (SYNTHROID, LEVOTHROID) 50 MCG tablet Take 50 mcg by mouth daily before breakfast.  . metoprolol succinate (TOPROL XL) 25 MG 24 hr tablet Take 1 tablet (25 mg total) by mouth daily. (Patient taking differently: Take 25 mg by mouth every evening.)  . pantoprazole (PROTONIX) 40 MG tablet Take 1 tablet (40 mg total) by mouth daily. (Patient  taking differently: Take 40 mg by mouth at bedtime.)  . rosuvastatin (CRESTOR) 40 MG tablet Take 40 mg by mouth at bedtime.   . sucralfate (CARAFATE) 1 g tablet Take 1 g by mouth 4 (four) times daily.     Allergies:   Wellbutrin [bupropion], Levaquin [levofloxacin], and Pregabalin   Social History   Socioeconomic History  . Marital status: Married    Spouse name: Not on file  . Number of children: 2  . Years of education: Not on file  . Highest education level: Not on file  Occupational History  . Occupation: Nutritional therapist  Tobacco Use  . Smoking status: Never Smoker  . Smokeless tobacco: Never Used  Vaping Use  . Vaping Use: Never used  Substance and Sexual Activity  . Alcohol use: No  . Drug use: No  . Sexual activity: Not on file  Other Topics Concern  . Not on file  Social History Narrative  . Not on file   Social Determinants of Health   Financial Resource Strain: Not on file  Food Insecurity: Not on file  Transportation Needs: Not on file  Physical Activity: Not on file  Stress: Not on file  Social Connections: Not on file     Family History: The patient's  family history includes COPD in her father; Dementia in her mother; Throat cancer in her brother. There is no history of Colon cancer, Esophageal cancer, Inflammatory bowel disease, Liver disease, Pancreatic cancer, Rectal cancer, or Stomach cancer.  ROS:   Please see the history of present illness.    All other systems reviewed and are negative.  EKGs/Labs/Other Studies Reviewed:    The following studies were reviewed today: I discussed my findings with the patient at extensive length.   Recent Labs: No results found for requested labs within last 8760 hours.  Recent Lipid Panel No results found for: CHOL, TRIG, HDL, CHOLHDL, VLDL, LDLCALC, LDLDIRECT  Physical Exam:    VS:  BP 128/62   Pulse 64   Ht 5\' 2"  (1.575 m)   Wt 131 lb (59.4 kg)   SpO2 95%   BMI 23.96 kg/m     Wt Readings  from Last 3 Encounters:  04/06/20 131 lb (59.4 kg)  10/05/19 138 lb (62.6 kg)  04/13/19 137 lb 1.9 oz (62.2 kg)     GEN: Patient is in no acute distress HEENT: Normal NECK: No JVD; No carotid bruits LYMPHATICS: No lymphadenopathy CARDIAC: Hear sounds regular, 2/6 systolic murmur at the apex. RESPIRATORY:  Clear to auscultation without rales, wheezing or rhonchi  ABDOMEN: Soft, non-tender, non-distended MUSCULOSKELETAL:  No edema; No deformity  SKIN: Warm and dry NEUROLOGIC:  Alert and oriented x 3 PSYCHIATRIC:  Normal affect   Signed, Jenean Lindau, MD  04/06/2020 4:16 PM    [facility]

## 2020-04-18 DIAGNOSIS — Z1231 Encounter for screening mammogram for malignant neoplasm of breast: Secondary | ICD-10-CM | POA: Diagnosis not present

## 2020-08-17 DIAGNOSIS — E039 Hypothyroidism, unspecified: Secondary | ICD-10-CM | POA: Diagnosis present

## 2020-08-17 DIAGNOSIS — R5383 Other fatigue: Secondary | ICD-10-CM | POA: Diagnosis not present

## 2020-08-17 DIAGNOSIS — E782 Mixed hyperlipidemia: Secondary | ICD-10-CM | POA: Diagnosis not present

## 2020-08-17 DIAGNOSIS — N3010 Interstitial cystitis (chronic) without hematuria: Secondary | ICD-10-CM | POA: Diagnosis not present

## 2020-08-17 DIAGNOSIS — N1831 Chronic kidney disease, stage 3a: Secondary | ICD-10-CM

## 2020-08-17 DIAGNOSIS — R5381 Other malaise: Secondary | ICD-10-CM | POA: Diagnosis not present

## 2020-08-17 DIAGNOSIS — F321 Major depressive disorder, single episode, moderate: Secondary | ICD-10-CM | POA: Diagnosis not present

## 2020-08-17 DIAGNOSIS — I129 Hypertensive chronic kidney disease with stage 1 through stage 4 chronic kidney disease, or unspecified chronic kidney disease: Secondary | ICD-10-CM | POA: Diagnosis not present

## 2020-08-17 DIAGNOSIS — K2100 Gastro-esophageal reflux disease with esophagitis, without bleeding: Secondary | ICD-10-CM

## 2020-08-17 DIAGNOSIS — Z8719 Personal history of other diseases of the digestive system: Secondary | ICD-10-CM

## 2020-08-17 DIAGNOSIS — I10 Essential (primary) hypertension: Secondary | ICD-10-CM | POA: Diagnosis not present

## 2020-08-17 DIAGNOSIS — Z0000 Encounter for general adult medical examination without abnormal findings: Secondary | ICD-10-CM | POA: Diagnosis not present

## 2020-08-17 HISTORY — DX: Hypothyroidism, unspecified: E03.9

## 2020-08-17 HISTORY — DX: Chronic kidney disease, stage 3a: N18.31

## 2020-08-17 HISTORY — DX: Personal history of other diseases of the digestive system: Z87.19

## 2020-08-17 HISTORY — DX: Gastro-esophageal reflux disease with esophagitis, without bleeding: K21.00

## 2020-09-27 IMAGING — RF DG ESOPHAGUS
8 of 10 series · 14 of 24 positions shown · non-contrast
Comparison: Chest CT 02/17/2018

CLINICAL DATA: Esophageal dysphagia for 6 months. Choking on water.

EXAM:
ESOPHOGRAM / BARIUM SWALLOW / BARIUM TABLET STUDY
TECHNIQUE: Combined double contrast and single contrast examination performed
using effervescent crystals, thick barium liquid, and thin barium
liquid. The patient was observed with fluoroscopy swallowing a 13 mm
barium sulphate tablet.
FLUOROSCOPY TIME:  Fluoroscopy Time:  3 minutes and 10 seconds.
Radiation Exposure Index (if provided by the fluoroscopic device):
34.4 mGy
Number of Acquired Spot Images: 0

[Series 1: cp_standard · 0.35mm/px · 2 of 48 frames shown (1 of 8)]
[frame 8/48]
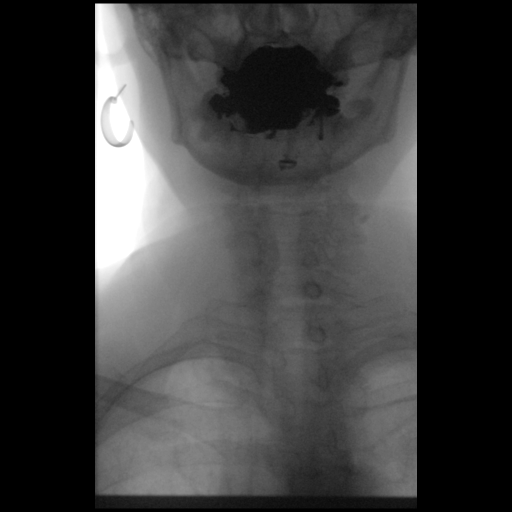
[frame 25/48]
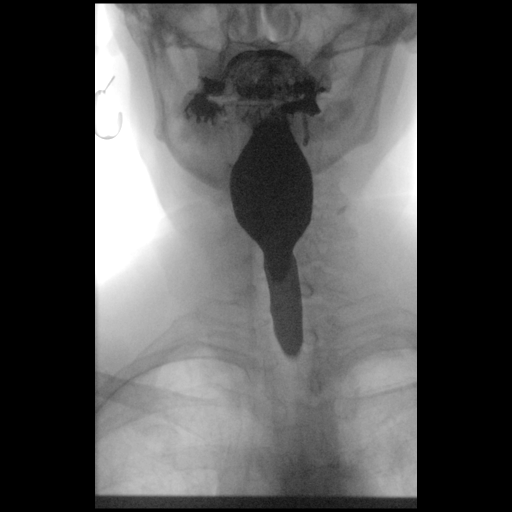

[Series 2: cp_standard · 0.35mm/px · 2 of 66 frames shown (2 of 8)]
[frame 34/66]
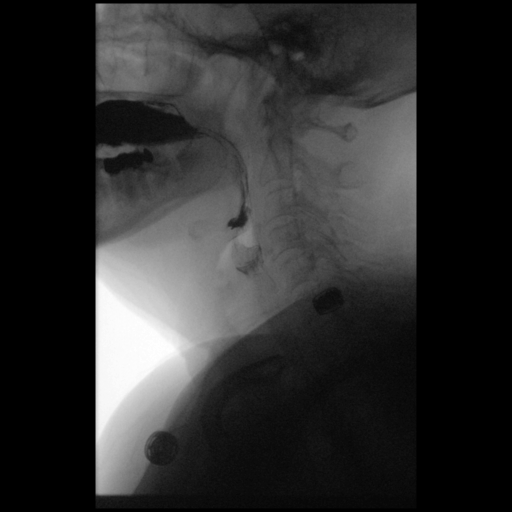
[frame 57/66]
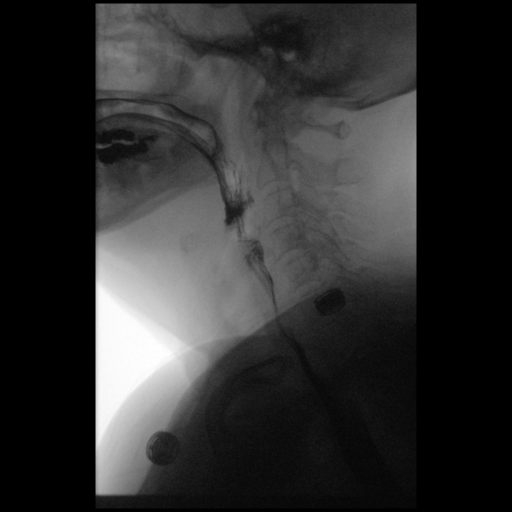

[Series 3: cp_standard · 0.35mm/px · 2 of 96 frames shown (3 of 8)]
[frame 41/96]
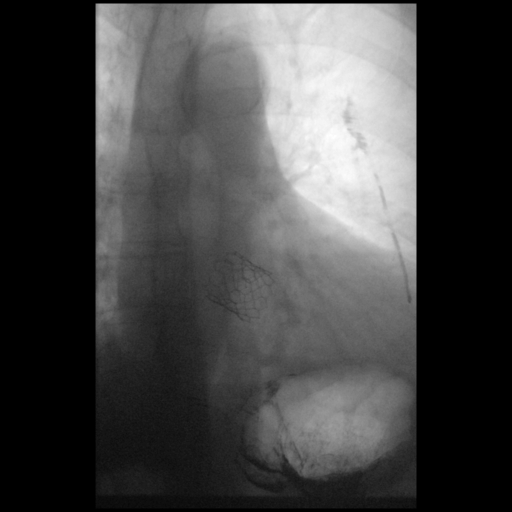
[frame 82/96]
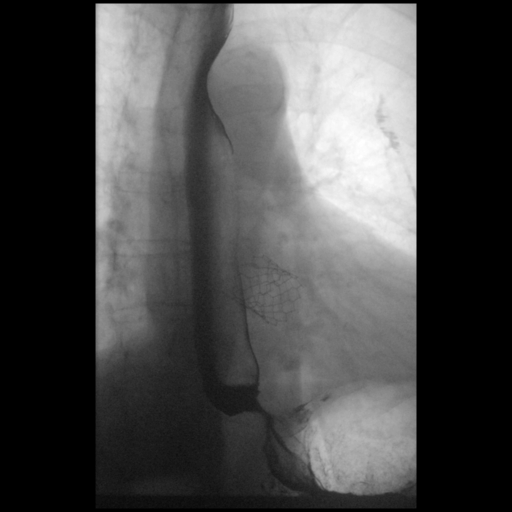

[Series 4: cp_standard · 0.35mm/px · 2 of 44 frames shown (4 of 8)]
[frame 23/44]
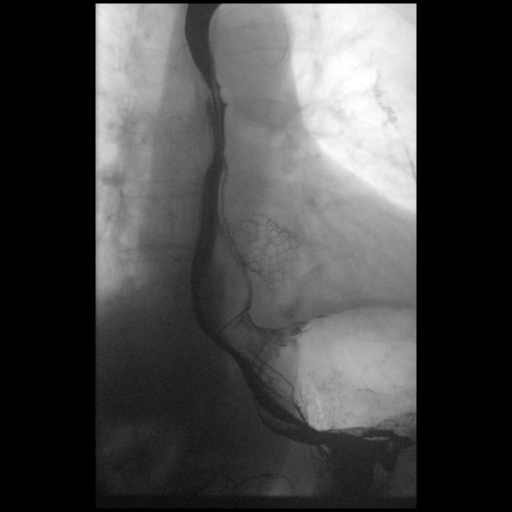
[frame 38/44]
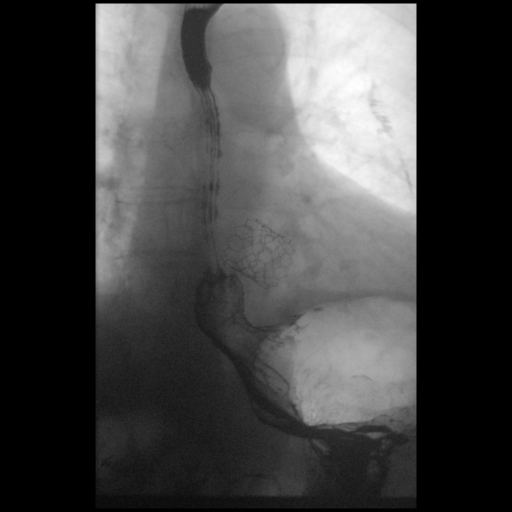

[Series 5: cp_standard · 0.36mm/px · 2 of 86 frames shown (5 of 8)]
[frame 14/86]
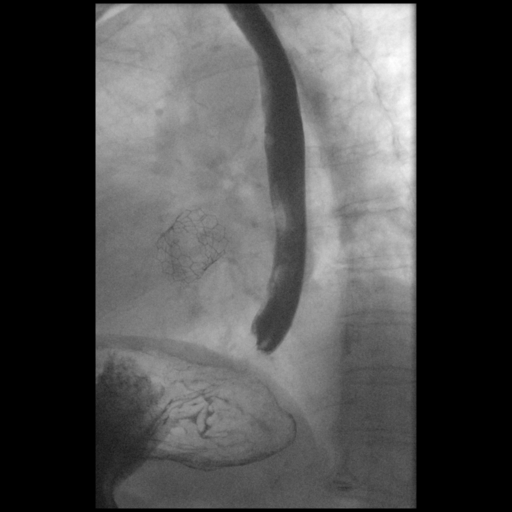
[frame 74/86]
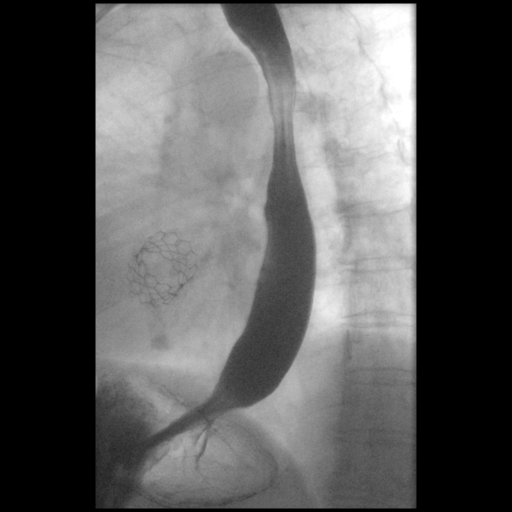

[Series 8: cp_standard · 0.36mm/px · 2 of 101 frames shown (6 of 8)]
[frame 2/101]
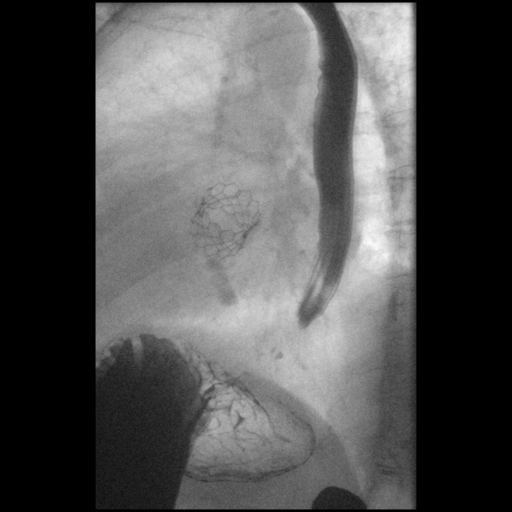
[frame 16/101]
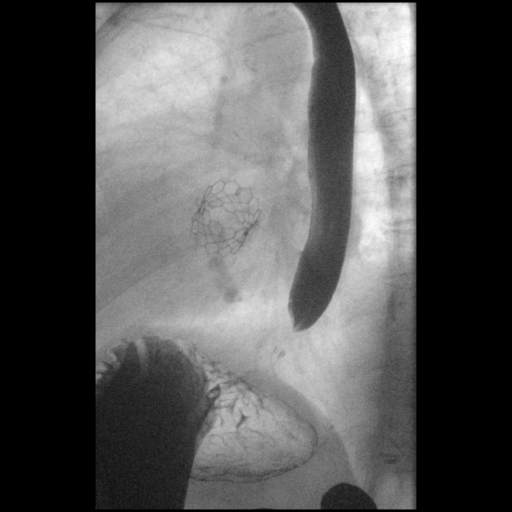

[Series 9: cp_standard · 0.18mm/px · 1 of 1 slices shown (7 of 8)]
[im 1/1]
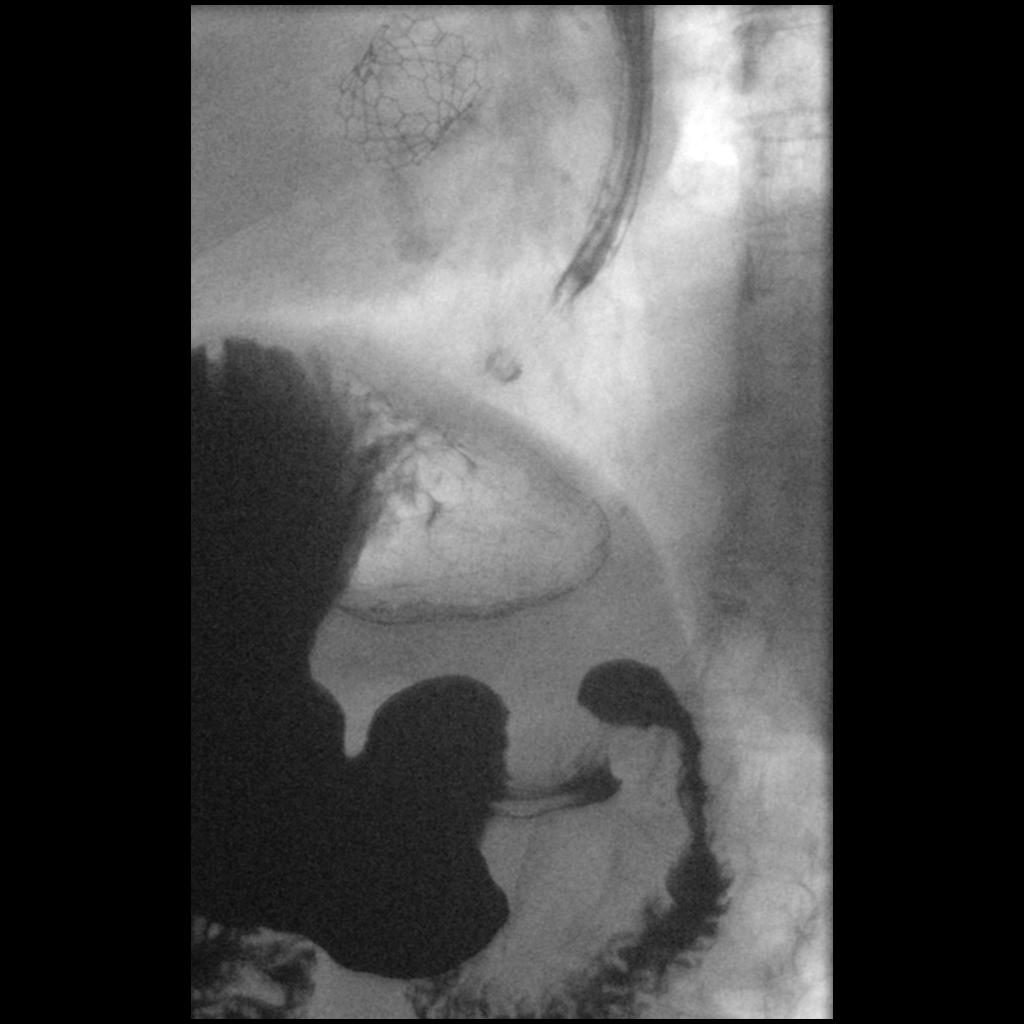

[Series 11: cp_standard · 0.18mm/px · 1 of 1 slices shown (8 of 8)]
[im 1/1]
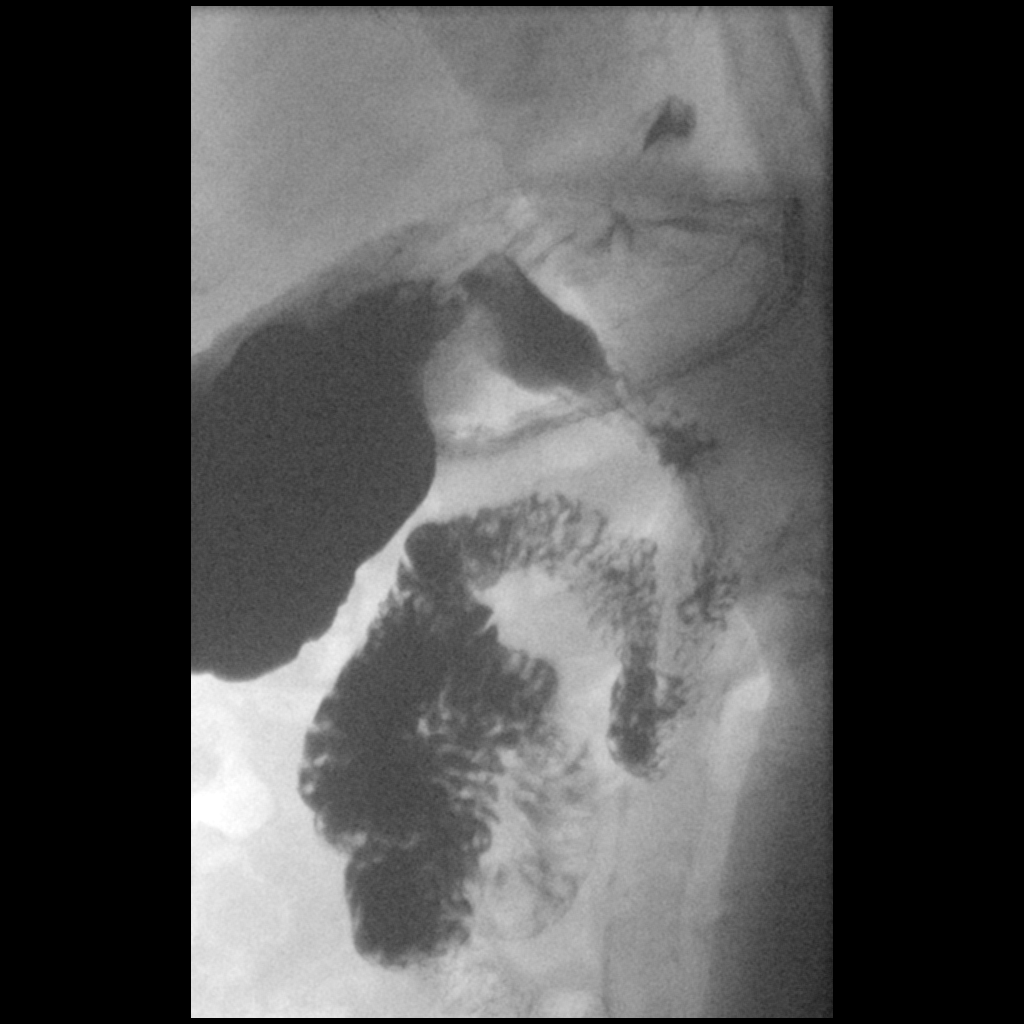

[14 of 24 positions shown; findings below may reference images not displayed]

FINDINGS: Initial barium swallows demonstrate normal pharyngeal motion with
swallowing. No laryngeal penetration or aspiration. No upper
esophageal webs, strictures or diverticuli. Mildly prominent
cricopharyngeal impression but no Jumper diverticulum.

Normal esophageal motility. No intrinsic or extrinsic lesions of the
esophagus were identified. No mucosal abnormalities. There is a
small sliding-type hiatal hernia and 1 episode of GE reflux.

The 13 mm barium pill passed into the stomach without difficulty.
IMPRESSION: 1. Prominent cricopharyngeal impression but no Jumper
diverticulum.
2. Normal esophageal motility.
3. Small sliding-type hiatal hernia and 1 episode of GE reflux.
4. No esophageal mass or stricture.

## 2020-10-06 ENCOUNTER — Encounter: Payer: Self-pay | Admitting: Cardiology

## 2020-10-06 ENCOUNTER — Ambulatory Visit (INDEPENDENT_AMBULATORY_CARE_PROVIDER_SITE_OTHER): Payer: Medicare Other | Admitting: Cardiology

## 2020-10-06 ENCOUNTER — Other Ambulatory Visit: Payer: Self-pay

## 2020-10-06 VITALS — BP 136/76 | HR 58 | Ht 62.0 in | Wt 130.2 lb

## 2020-10-06 DIAGNOSIS — I10 Essential (primary) hypertension: Secondary | ICD-10-CM | POA: Diagnosis not present

## 2020-10-06 DIAGNOSIS — E7800 Pure hypercholesterolemia, unspecified: Secondary | ICD-10-CM

## 2020-10-06 DIAGNOSIS — Z952 Presence of prosthetic heart valve: Secondary | ICD-10-CM

## 2020-10-06 NOTE — Progress Notes (Signed)
Cardiology Office Note:    Date:  10/06/2020   ID:  Shannon Mills, DOB 1940/05/07, MRN [id number]  PCP:  Gweneth Fritter, FNP  Cardiologist:  Jenean Lindau, MD   Referring MD: Gweneth Fritter, FNP    ASSESSMENT:    1. Essential hypertension   2. Hypercholesterolemia   3. S/P TAVR (transcatheter aortic valve replacement)    PLAN:    In order of problems listed above:  Primary prevention stressed with the patient.  Importance of compliance with diet medication stressed and she vocalized understanding.  She is going to effort tolerance and encouraged her to continue walking to the best of her ability. Essential hypertension: Blood pressure stable and diet was emphasized. Mixed dyslipidemia: On statin therapy and lipids followed by primary care. S/p TAVR: Stable at this time.  Auscultation also unremarkable. Patient will be seen in follow-up appointment in 6 months or earlier if the patient has any concerns    Medication Adjustments/Labs and Tests Ordered: Current medicines are reviewed at length with the patient today.  Concerns regarding medicines are outlined above.  No orders of the defined types were placed in this encounter.  No orders of the defined types were placed in this encounter.    No chief complaint on file.    History of Present Illness:    Shannon Mills is a 80 y.o. female.  Patient has past medical history of aortic valve replacement with TAVR, essential hypertension and dyslipidemia.  Patient denies any problems at this time and takes care of activities of daily living.  No chest pain orthopnea or PND.  She is active age appropriately.  At the time of my evaluation, the patient is alert awake oriented and in no distress.  Past Medical History:  Diagnosis Date   Abnormal colonoscopy 12/15/2018   Adenomatous polyp of ascending colon 12/15/2018   Anxiety    Chest pain 06/08/2018   Chronic kidney disease, stage 3a ([place]) 08/17/2020   Chronic left-sided  low back pain with left-sided sciatica 09/08/2019   Chronic reflux esophagitis    Essential hypertension 01/29/2018   Gastroesophageal reflux disease with esophagitis without hemorrhage 08/17/2020   Gastroesophageal reflux disease without esophagitis 09/08/2019   Hiatal hernia    History of colonic polyps 12/15/2018   History of hiatal hernia 08/17/2020   Hx of colonic polyp    Hypercholesterolemia    Hypertension    Hyponatremia 06/10/2018   Hypothyroidism    Hypothyroidism (acquired) 08/17/2020   Interstitial cystitis    Interstitial cystitis (chronic) without hematuria 06/19/2015   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 Mild sigmoid diverticulosis. Otherwise grossly normal colonoscopy to terminal ileum    COLONOSCOPY WITH PROPOFOL N/A 02/18/2019   Procedure: COLONOSCOPY WITH PROPOFOL;  Surgeon: Rush Landmark Telford Nab., MD;  Location: [place];  Service: Gastroenterology;  Laterality: N/A;   ENDOSCOPIC MUCOSAL RESECTION N/A 02/18/2019   Procedure: ENDOSCOPIC MUCOSAL RESECTION;  Surgeon: Rush Landmark, Telford Nab., MD;  Location: [place];  Service:  Gastroenterology;  Laterality: N/A;   FOOT SURGERY     HEMOSTASIS CLIP PLACEMENT  02/18/2019   Procedure: HEMOSTASIS CLIP PLACEMENT;  Surgeon: Rush Landmark Telford Nab., MD;  Location: [facility] ENDOSCOPY;  Service: Gastroenterology;;   INTRAOPERATIVE TRANSTHORACIC ECHOCARDIOGRAM N/A 03/03/2018   Procedure: INTRAOPERATIVE TRANSTHORACIC ECHOCARDIOGRAM;  Surgeon:  Burnell Blanks, MD;  Location: [place];  Service: Open Heart Surgery;  Laterality: N/A;   POLYPECTOMY  02/18/2019   Procedure: POLYPECTOMY;  Surgeon: Rush Landmark Telford Nab., MD;  Location: [place];  Service: Gastroenterology;;   RIGHT/LEFT HEART CATH AND CORONARY ANGIOGRAPHY N/A 02/03/2018   Procedure: RIGHT/LEFT HEART CATH AND CORONARY ANGIOGRAPHY;  Surgeon: Martinique, Peter M, MD;  Location: [place] CV LAB;  Service: Cardiovascular;  Laterality: N/A;   SUBMUCOSAL LIFTING INJECTION  02/18/2019   Procedure: SUBMUCOSAL LIFTING INJECTION;  Surgeon: Rush Landmark Telford Nab., MD;  Location: [place];  Service: Gastroenterology;;   TRANSCATHETER AORTIC VALVE REPLACEMENT, TRANSFEMORAL  03/03/2018   TRANSCATHETER AORTIC VALVE REPLACEMENT, TRANSFEMORAL N/A 03/03/2018   Procedure: TRANSCATHETER AORTIC VALVE REPLACEMENT, TRANSFEMORAL;  Surgeon: Burnell Blanks, MD;  Location: [place];  Service: Open Heart Surgery;  Laterality: N/A;   TUBAL LIGATION      Current Medications: Current Meds  Medication Sig   acetaminophen (TYLENOL) 500 MG tablet Take 1 tablet by mouth every 6 (six) hours as needed for pain.   aspirin EC 81 MG tablet Take 81 mg by mouth daily.   b complex vitamins capsule Take 1 capsule by mouth daily.   cetirizine (ZYRTEC) 10 MG tablet Take 10 mg by mouth at bedtime.   escitalopram (LEXAPRO) 20 MG tablet Take 20 mg by mouth daily.   levothyroxine (SYNTHROID, LEVOTHROID) 50 MCG tablet Take 50 mcg by mouth daily before breakfast.   metoprolol succinate (TOPROL XL) 25 MG 24 hr tablet Take 1 tablet (25 mg total) by mouth daily.   pantoprazole (PROTONIX) 40 MG tablet Take 1 tablet (40 mg total) by mouth daily.   rosuvastatin (CRESTOR) 40 MG tablet Take 40 mg by mouth at bedtime.      Allergies:   Wellbutrin [bupropion], Levaquin [levofloxacin], and Pregabalin   Social History   Socioeconomic History   Marital status: Married    Spouse name: Not on file   Number  of children: 2   Years of education: Not on file   Highest education level: Not on file  Occupational History   Occupation: Retired-Office payroll  Tobacco Use   Smoking status: Never   Smokeless tobacco: Never  Vaping Use   Vaping Use: Never used  Substance and Sexual Activity   Alcohol use: No   Drug use: No   Sexual activity: Not on file  Other Topics Concern   Not on file  Social History Narrative   Not on file   Social Determinants of Health   Financial Resource Strain: Not on file  Food Insecurity: Not on file  Transportation Needs: Not on file  Physical Activity: Not on file  Stress: Not on file  Social Connections: Not on file     Family History: The patient's family history includes COPD in her father; Dementia in her mother; Throat cancer in her brother. There is no history of Colon cancer, Esophageal cancer, Inflammatory bowel disease, Liver disease, Pancreatic cancer, Rectal cancer, or Stomach cancer.  ROS:   Please see the history of present illness.    All other systems reviewed and are negative.  EKGs/Labs/Other Studies Reviewed:    The following studies were reviewed  today: EKG was sinus, nonspecific ST-T changes   Recent Labs: No results found for requested labs within last 8760 hours.  Recent Lipid Panel No results found for: CHOL, TRIG, HDL, CHOLHDL, VLDL, LDLCALC, LDLDIRECT  Physical Exam:    VS:  BP 136/76   Pulse (!) 58   Ht 5\' 2"  (1.575 m)   Wt 130 lb 3.2 oz (59.1 kg)   SpO2 97%   BMI 23.81 kg/m     Wt Readings from Last 3 Encounters:  10/06/20 130 lb 3.2 oz (59.1 kg)  04/06/20 131 lb (59.4 kg)  10/05/19 138 lb (62.6 kg)     GEN: Patient is in no acute distress HEENT: Normal NECK: No JVD; No carotid bruits LYMPHATICS: No lymphadenopathy CARDIAC: Hear sounds regular, 2/6 systolic murmur at the apex. RESPIRATORY:  Clear to auscultation without rales, wheezing or rhonchi  ABDOMEN: Soft, non-tender,  non-distended MUSCULOSKELETAL:  No edema; No deformity  SKIN: Warm and dry NEUROLOGIC:  Alert and oriented x 3 PSYCHIATRIC:  Normal affect   Signed, Jenean Lindau, MD  10/06/2020 11:49 AM    Onset

## 2020-10-06 NOTE — Patient Instructions (Signed)

## 2020-10-26 ENCOUNTER — Telehealth: Payer: Self-pay | Admitting: Gastroenterology

## 2020-10-26 NOTE — Telephone Encounter (Signed)
Nurse triage - please send to nurse.

## 2020-10-26 NOTE — Telephone Encounter (Signed)
Pt calling to inform she is experiencing abd. Pt feels it may be from her hiatal hernia..  Plz advise  thank you

## 2020-10-27 NOTE — Telephone Encounter (Signed)
This is a patient of Dr Steve Rattler- Dr Rush Landmark only did colon EMR.  The pt states she has abd pain and hiatal hernia that she is suppose follow up with Dr Lyndel Safe about.  The pt states that this pain is something she has had in the past and Dr Lyndel Safe has "stretched her esophagus" in the past. She has been scheduled to see Dr Lyndel Safe on 7/19 at 4 pm.

## 2020-11-07 ENCOUNTER — Encounter: Payer: Self-pay | Admitting: Gastroenterology

## 2020-11-07 ENCOUNTER — Other Ambulatory Visit: Payer: Self-pay

## 2020-11-07 ENCOUNTER — Ambulatory Visit (INDEPENDENT_AMBULATORY_CARE_PROVIDER_SITE_OTHER): Payer: Medicare Other | Admitting: Gastroenterology

## 2020-11-07 VITALS — BP 124/68 | HR 60 | Ht 62.0 in | Wt 132.2 lb

## 2020-11-07 DIAGNOSIS — Z86010 Personal history of colonic polyps: Secondary | ICD-10-CM | POA: Diagnosis not present

## 2020-11-07 DIAGNOSIS — G8929 Other chronic pain: Secondary | ICD-10-CM | POA: Diagnosis not present

## 2020-11-07 DIAGNOSIS — Z952 Presence of prosthetic heart valve: Secondary | ICD-10-CM | POA: Diagnosis not present

## 2020-11-07 DIAGNOSIS — R1013 Epigastric pain: Secondary | ICD-10-CM | POA: Diagnosis not present

## 2020-11-07 DIAGNOSIS — D122 Benign neoplasm of ascending colon: Secondary | ICD-10-CM | POA: Diagnosis not present

## 2020-11-07 MED ORDER — DICYCLOMINE HCL 10 MG PO CAPS: 10.0000 mg | ORAL_CAPSULE | Freq: Two times a day (BID) | ORAL | 2 refills | Status: DC

## 2020-11-07 NOTE — Progress Notes (Signed)
Chief Complaint: FU  Referring Provider:  Gweneth Fritter, FNP      ASSESSMENT AND PLAN;   #1.  GERD with Schatzki's ring s/p EGD with dil to 50 Fr 10/27/2018 with resolution of dysphagia. H/O noncardiac chest pains. Better with GI cocktail.  #2. Severe AS s/p TAVR 03/03/2018 off plavix 08/2018. Followed by Dr Geraldo Pitter. 2DE- Nl, normally functioning TAVR. Doing quite well clinically. OK by cardiology to undergo GI procedures.  #3. Large ascending tubular adenoma s/p EMR by Dr. Rush Landmark 01/2019.  It was recommended to get repeat colonoscopy in 6 months.  She would like to hold off currently.  Plan: -Change omeprazole to protonix 40mg  po QD #30, 6 reffils. -Check CBC, CMP and lipase. -CTA to assess mesenteric circulation especially celiac artery -Continue Bentyl 10mg  po bid prn #60, 6 refills -Use IcyHot twice daily as needed to determine if there is any musculoskeletal component. -If still with problems would recommend EGD.  Not keen on getting repeat colonoscopy at this time.  She would consider in the future.   HPI:    Shannon Mills is a 80 y.o. female  For follow-up visit.  Has been having midepigastric pain-gets worse after eating and with walking. He has tried multiple medications with only temporary relief. Bentyl has helped somewhat as well.  She is quite worried about " mesenteric circulation".  No further dysphagia Still has epigastric discomfort and noncardiac chest pains.  GI cocktail helped only temporarily.  No jaundice dark urine or pale stools. Omeprazole is not helping much.  NO shortness of breath.  No nausea, vomiting, regurgitation, odynophagia.  No significant diarrhea or constipation.  There is no melena or hematochezia. No unintentional weight loss.   No lower abdominal pain.  Had episode of chest pains in February-noncardiac admitted to [facility].  Cardiac work-up was negative.  Husband- [name] is pt is ours. Past Medical History:  Diagnosis  Date   Abnormal colonoscopy 12/15/2018   Adenomatous polyp of ascending colon 12/15/2018   Anxiety    Chest pain 06/08/2018   Chronic kidney disease, stage 3a ([place]) 08/17/2020   Chronic left-sided low back pain with left-sided sciatica 09/08/2019   Chronic reflux esophagitis    Essential hypertension 01/29/2018   Gastroesophageal reflux disease with esophagitis without hemorrhage 08/17/2020   Gastroesophageal reflux disease without esophagitis 09/08/2019   Hiatal hernia    History of colonic polyps 12/15/2018   History of hiatal hernia 08/17/2020   Hx of colonic polyp    Hypercholesterolemia    Hypertension    Hyponatremia 06/10/2018   Hypothyroidism    Hypothyroidism (acquired) 08/17/2020   Interstitial cystitis    Interstitial cystitis (chronic) without hematuria 06/19/2015   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 Mild sigmoid diverticulosis. Otherwise grossly normal colonoscopy to terminal ileum    COLONOSCOPY WITH PROPOFOL N/A 02/18/2019   Procedure: COLONOSCOPY WITH PROPOFOL;  Surgeon: Rush Landmark Telford Nab., MD;  Location: [place];  Service:  Gastroenterology;  Laterality: N/A;   ENDOSCOPIC MUCOSAL RESECTION N/A 02/18/2019   Procedure: ENDOSCOPIC MUCOSAL RESECTION;  Surgeon: Rush Landmark Telford Nab., MD;  Location: [place];  Service: Gastroenterology;  Laterality: N/A;   FOOT SURGERY     HEMOSTASIS CLIP PLACEMENT  02/18/2019   Procedure: HEMOSTASIS CLIP PLACEMENT;   Surgeon: Rush Landmark Telford Nab., MD;  Location: [facility] ENDOSCOPY;  Service: Gastroenterology;;   INTRAOPERATIVE TRANSTHORACIC ECHOCARDIOGRAM N/A 03/03/2018   Procedure: INTRAOPERATIVE TRANSTHORACIC ECHOCARDIOGRAM;  Surgeon: Burnell Blanks, MD;  Location: [place];  Service: Open Heart Surgery;  Laterality: N/A;   POLYPECTOMY  02/18/2019   Procedure: POLYPECTOMY;  Surgeon: Rush Landmark Telford Nab., MD;  Location: [place];  Service: Gastroenterology;;   RIGHT/LEFT HEART CATH AND CORONARY ANGIOGRAPHY N/A 02/03/2018   Procedure: RIGHT/LEFT HEART CATH AND CORONARY ANGIOGRAPHY;  Surgeon: Martinique, Peter M, MD;  Location: [place] CV LAB;  Service: Cardiovascular;  Laterality: N/A;   SUBMUCOSAL LIFTING INJECTION  02/18/2019   Procedure: SUBMUCOSAL LIFTING INJECTION;  Surgeon: Rush Landmark Telford Nab., MD;  Location: [place];  Service: Gastroenterology;;   TRANSCATHETER AORTIC VALVE REPLACEMENT, TRANSFEMORAL  03/03/2018   TRANSCATHETER AORTIC VALVE REPLACEMENT, TRANSFEMORAL N/A 03/03/2018   Procedure: TRANSCATHETER AORTIC VALVE REPLACEMENT, TRANSFEMORAL;  Surgeon: Burnell Blanks, MD;  Location: [place];  Service: Open Heart Surgery;  Laterality: N/A;   TUBAL LIGATION      Family History  Problem Relation Age of Onset   Dementia Mother    COPD Father    Throat cancer Brother    Colon cancer Neg Hx    Esophageal cancer Neg Hx    Inflammatory bowel disease Neg Hx    Liver disease Neg Hx    Pancreatic cancer Neg Hx    Rectal cancer Neg Hx    Stomach cancer Neg Hx     Social History   Tobacco Use   Smoking status: Never   Smokeless tobacco: Never  Vaping Use   Vaping Use: Never used  Substance Use Topics   Alcohol use: No   Drug use: No    Current Outpatient Medications  Medication Sig Dispense Refill   acetaminophen (TYLENOL) 500 MG tablet Take 1 tablet by mouth every 6 (six) hours as needed for pain.     aspirin EC 81 MG tablet Take 81 mg by mouth daily.     b  complex vitamins capsule Take 1 capsule by mouth daily.     cetirizine (ZYRTEC) 10 MG tablet Take 10 mg by mouth at bedtime.     escitalopram (LEXAPRO) 20 MG tablet Take 20 mg by mouth daily.     levothyroxine (SYNTHROID, LEVOTHROID) 50 MCG tablet Take 50 mcg by mouth daily before breakfast.     metoprolol succinate (TOPROL XL) 25 MG 24 hr tablet Take 1 tablet (25 mg total) by mouth daily. 90 tablet 3   pantoprazole (PROTONIX) 40 MG tablet Take 1 tablet (40 mg total) by mouth daily. 30 tablet 6   rosuvastatin (CRESTOR) 40 MG tablet Take 40 mg by mouth at bedtime.      No current facility-administered medications for this visit.    Allergies  Allergen Reactions   Wellbutrin [Bupropion] Nausea Only    EXTREME NAUSEA   Levaquin [Levofloxacin] Nausea And Vomiting   Pregabalin Other (See Comments)    Confusion.    Review of Systems:  neg     Physical Exam:    BP 124/68   Pulse 60   Ht 5\' 2"  (1.575 m)   Wt 132  lb 4 oz (60 kg)   SpO2 97%   BMI 24.19 kg/m  Filed Weights   11/07/20 1613  Weight: 132 lb 4 oz (60 kg)   Not examined -   Data Reviewed: I have personally reviewed following labs and imaging studies  CBC: CBC Latest Ref Rng & Units 02/15/2019 06/08/2018 03/04/2018  WBC 4.0 - 10.5 K/uL 4.3 4.9 4.8  Hemoglobin 12.0 - 15.0 g/dL 12.5 12.7 11.3(L)  Hematocrit 36.0 - 46.0 % 37.5 38.7 35.4(L)  Platelets 150.0 - 400.0 K/uL 124.0(L) 183 112(L)    CMP: CMP Latest Ref Rng & Units 02/15/2019 06/08/2018 03/04/2018  Glucose 70 - 99 mg/dL 100(H) 83 99  BUN 6 - 23 mg/dL 17 14 12   Creatinine 0.40 - 1.20 mg/dL 1.03 1.04(H) 0.91  Sodium 135 - 145 mEq/L 139 134(L) 139  Potassium 3.5 - 5.1 mEq/L 4.0 4.5 3.7  Chloride 96 - 112 mEq/L 104 102 110  CO2 19 - 32 mEq/L 26 23 26   Calcium 8.4 - 10.5 mg/dL 9.3 9.0 8.6(L)  Total Protein 6.5 - 8.1 g/dL - - -  Total Bilirubin 0.3 - 1.2 mg/dL - - -  Alkaline Phos 38 - 126 U/L - - -  AST 15 - 41 U/L - - -  ALT 0 - 44 U/L - - -       Carmell Austria, MD 11/07/2020, 4:25 PM  Cc: Gweneth Fritter, FNP

## 2020-11-07 NOTE — Patient Instructions (Addendum)
If you are age 80 or older, your body mass index should be between 23-30. Your Body mass index is 24.19 kg/m. If this is out of the aforementioned range listed, please consider follow up with your Primary Care Provider.  If you are age 4 or younger, your body mass index should be between 19-25. Your Body mass index is 24.19 kg/m. If this is out of the aformentioned range listed, please consider follow up with your Primary Care Provider.   __________________________________________________________  The [place] [facility] providers would like to encourage you to use [facility] to communicate with providers for non-urgent requests or questions.  Due to long hold times on the telephone, sending your provider a message by [facility] may be a faster and more efficient way to get a response.  Please allow 48 business hours for a response.  Please remember that this is for non-urgent requests.   We have sent the following medications to your pharmacy for you to pick up at your convenience: Bentyl  Please call if you haven't anything from [facility] by 11-17-2020 regarding lab work and CTA.  Continue Protonix  Thank you,  Dr. Jackquline Denmark

## 2020-12-01 ENCOUNTER — Encounter: Payer: Self-pay | Admitting: Gastroenterology

## 2020-12-01 DIAGNOSIS — G8929 Other chronic pain: Secondary | ICD-10-CM | POA: Diagnosis not present

## 2020-12-01 DIAGNOSIS — R1013 Epigastric pain: Secondary | ICD-10-CM | POA: Diagnosis not present

## 2020-12-01 DIAGNOSIS — Z952 Presence of prosthetic heart valve: Secondary | ICD-10-CM | POA: Diagnosis not present

## 2020-12-01 DIAGNOSIS — Z9049 Acquired absence of other specified parts of digestive tract: Secondary | ICD-10-CM | POA: Diagnosis not present

## 2020-12-06 ENCOUNTER — Telehealth: Payer: Self-pay | Admitting: Gastroenterology

## 2020-12-06 NOTE — Telephone Encounter (Signed)
Pt calling stating that she had labs at [facility] last week. She was told that results were faxed over.

## 2020-12-06 NOTE — Telephone Encounter (Signed)
Remo Lipps from [facility] outpatient center said she will call pt regarding lab work. They didn't draw the right labs from the order that was sent on 7-20  Patient is aware her results will be looked over hopefully tuesday

## 2020-12-07 DIAGNOSIS — R1013 Epigastric pain: Secondary | ICD-10-CM | POA: Diagnosis not present

## 2020-12-07 DIAGNOSIS — D122 Benign neoplasm of ascending colon: Secondary | ICD-10-CM | POA: Diagnosis not present

## 2020-12-07 DIAGNOSIS — Z952 Presence of prosthetic heart valve: Secondary | ICD-10-CM | POA: Diagnosis not present

## 2020-12-07 DIAGNOSIS — K219 Gastro-esophageal reflux disease without esophagitis: Secondary | ICD-10-CM | POA: Diagnosis not present

## 2020-12-12 NOTE — Telephone Encounter (Signed)
Patient has been notified regarding CTA and lab work. Things looks good for now

## 2020-12-12 NOTE — Telephone Encounter (Signed)
Patient calling back wanting to know if labs were received.  Please advise.

## 2021-02-03 DIAGNOSIS — Z23 Encounter for immunization: Secondary | ICD-10-CM | POA: Diagnosis not present

## 2021-02-16 DIAGNOSIS — R5383 Other fatigue: Secondary | ICD-10-CM | POA: Diagnosis not present

## 2021-02-16 DIAGNOSIS — E039 Hypothyroidism, unspecified: Secondary | ICD-10-CM | POA: Diagnosis not present

## 2021-02-16 DIAGNOSIS — E782 Mixed hyperlipidemia: Secondary | ICD-10-CM | POA: Diagnosis not present

## 2021-02-16 DIAGNOSIS — J302 Other seasonal allergic rhinitis: Secondary | ICD-10-CM

## 2021-02-16 DIAGNOSIS — E538 Deficiency of other specified B group vitamins: Secondary | ICD-10-CM

## 2021-02-16 DIAGNOSIS — Z8679 Personal history of other diseases of the circulatory system: Secondary | ICD-10-CM | POA: Diagnosis not present

## 2021-02-16 DIAGNOSIS — R5381 Other malaise: Secondary | ICD-10-CM | POA: Diagnosis not present

## 2021-02-16 DIAGNOSIS — K219 Gastro-esophageal reflux disease without esophagitis: Secondary | ICD-10-CM | POA: Diagnosis not present

## 2021-02-16 DIAGNOSIS — Z8719 Personal history of other diseases of the digestive system: Secondary | ICD-10-CM | POA: Diagnosis not present

## 2021-02-16 DIAGNOSIS — F411 Generalized anxiety disorder: Secondary | ICD-10-CM | POA: Insufficient documentation

## 2021-02-16 DIAGNOSIS — R300 Dysuria: Secondary | ICD-10-CM | POA: Diagnosis not present

## 2021-02-16 DIAGNOSIS — I10 Essential (primary) hypertension: Secondary | ICD-10-CM | POA: Diagnosis not present

## 2021-02-16 DIAGNOSIS — F418 Other specified anxiety disorders: Secondary | ICD-10-CM | POA: Diagnosis not present

## 2021-02-16 HISTORY — DX: Other seasonal allergic rhinitis: J30.2

## 2021-02-16 HISTORY — DX: Deficiency of other specified B group vitamins: E53.8

## 2021-02-16 HISTORY — DX: Generalized anxiety disorder: F41.1

## 2021-02-22 DIAGNOSIS — R899 Unspecified abnormal finding in specimens from other organs, systems and tissues: Secondary | ICD-10-CM | POA: Diagnosis not present

## 2021-03-02 ENCOUNTER — Encounter: Payer: Self-pay | Admitting: Gastroenterology

## 2021-03-02 ENCOUNTER — Other Ambulatory Visit: Payer: Self-pay

## 2021-03-02 ENCOUNTER — Ambulatory Visit (INDEPENDENT_AMBULATORY_CARE_PROVIDER_SITE_OTHER): Payer: Medicare Other | Admitting: Gastroenterology

## 2021-03-02 ENCOUNTER — Telehealth: Payer: Self-pay

## 2021-03-02 VITALS — BP 124/78 | HR 59 | Wt 131.2 lb

## 2021-03-02 DIAGNOSIS — K222 Esophageal obstruction: Secondary | ICD-10-CM

## 2021-03-02 DIAGNOSIS — R0789 Other chest pain: Secondary | ICD-10-CM

## 2021-03-02 DIAGNOSIS — K219 Gastro-esophageal reflux disease without esophagitis: Secondary | ICD-10-CM | POA: Diagnosis not present

## 2021-03-02 DIAGNOSIS — Z952 Presence of prosthetic heart valve: Secondary | ICD-10-CM | POA: Diagnosis not present

## 2021-03-02 MED ORDER — PANTOPRAZOLE SODIUM 40 MG PO TBEC: 40.0000 mg | DELAYED_RELEASE_TABLET | Freq: Every day | ORAL | 6 refills | Status: AC

## 2021-03-02 NOTE — Telephone Encounter (Signed)
Patient Called back. Informed that she is good to go for upcoming procedure. Patient voiced understanding.

## 2021-03-02 NOTE — Patient Instructions (Addendum)
If you are age 80 or older, your body mass index should be between 23-30. Your Body mass index is 24.01 kg/m. If this is out of the aforementioned range listed, please consider follow up with your Primary Care Provider.  If you are age 61 or younger, your body mass index should be between 19-25. Your Body mass index is 24.01 kg/m. If this is out of the aformentioned range listed, please consider follow up with your Primary Care Provider.   ________________________________________________________  The  GI providers would like to encourage you to use [facility] to communicate with providers for non-urgent requests or questions.  Due to long hold times on the telephone, sending your provider a message by [facility] may be a faster and more efficient way to get a response.  Please allow 48 business hours for a response.  Please remember that this is for non-urgent requests.  _______________________________________________________  Shannon Mills have been scheduled for an endoscopy. Please follow written instructions given to you at your visit today. If you use inhalers (even only as needed), please bring them with you on the day of your procedure.  We have sent the following medications to your pharmacy for you to pick up at your convenience: Protonix  Continue buspar  You will be contacted by our office prior to your procedure regarding cardiac clearance.  If you do not hear from our office 1 week prior to your scheduled procedure, please call [phone] to discuss.   Please call with any questions or concerns.  Thank you,  Dr. Jackquline Denmark

## 2021-03-02 NOTE — Progress Notes (Signed)
Chief Complaint: FU  Referring Provider:  Gweneth Fritter, FNP      ASSESSMENT AND PLAN;   #1. GERD with Schatzki's ring s/p EGD with dil to 50 Fr 10/27/2018.  #2.  Atypical chest pains- not cardiac per Dr Geraldo Pitter. H/O Severe AS s/p TAVR 03/03/2018 off plavix 08/2018. Followed by Dr Geraldo Pitter. 2DE- Nl, normally functioning TAVR.  #3.  Advanced colonic polyps (tubular adenoma s)s/p EMR by Dr. Rush Landmark 01/2019.  Recommended to get rpt colon in 6 months. Pt does not want any further colonoscopies.  She understands risks of developing colonic neoplasms.  Plan: -Continue protonix 40mg  po QD #30, 6 reffils. -EGD with dil. Will get clearence from Dr Geraldo Pitter. -Wants to hold off colon. She understands risk of developing colon Ca in future. -If still with problems, will give her a trial of GI cocktail. -Continue buspar. -D/W in detail with [facility].    HPI:    Shannon Mills is a 80 y.o. female  For follow-up visit. Accompanied by her daughter Shannon Mills  With atypical chest pains.  Seen by Dr. Melany Guernsey to be noncardiac.  Has been having occasional problems with solid food dysphagia.  There is some role of anxiety.  [name], FNP has started her on BuSpar with some relief.  Here for repeat EGD with dilatation as it did help her in the past.  She adamantly refuses to have follow-up colonoscopy.  She does understand that there is a small but definite risk of colorectal cancers especially given history of advanced colon polyps s/p piecemeal polypectomy.  She is willing to take that chance.  No nausea, vomiting, regurgitation, odynophagia.  No significant diarrhea or constipation.  There is no melena or hematochezia. No unintentional weight loss.      SH Husband- [name] is pt is ours. Great GM- [place].  Her daughter Su Grand is a patient of ours.  Past GI procedures:  EGD 10/2018 Schatzki ring s/p esophageal dilatation to 50Fr - 2 cm hiatal hernia. - Mild gastritis. Neg  HP  Colonoscopy 01/2019 Dr. Rush Landmark - Hemorrhoids found on digital rectal exam. - One 12 mm polyp at the appendiceal orifice, removed with mucosal resection. Resected and retrieved. Clip (MR conditional) was placed. - One 35 mm polyp in the proximal ascending colon, removed with mucosal resection. Resected and retrieved. Clips (MR conditional) were placed. - Four 3 to 6 mm polyps in the transverse colon and in the ascending colon, removed with a cold snare. Resected and retrieved. - Non-bleeding non-thrombosed external and internal hemorrhoids. -Bx-tubular adenomas.  Repeat in 6 months  CTA 11/2020 -No acute findings -No proximal mesenteric artery occlusion   Additional past medical history: 1. Essential hypertension  2. Gastroesophageal reflux disease without esophagitis  3. Hypothyroidism (acquired)  4. Lumbar disc herniation with radiculopathy  5. Interstitial cystitis (chronic) without hematuria  6. 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 Mixed hyperlipidemia  9. Malaise and fatigue  10. B12 deficiency  11. History of aortic valve repair  12. Dysuria  13. History of hiatal hernia  Past Medical History:  Diagnosis Date   Abnormal colonoscopy 12/15/2018   Adenomatous polyp of ascending colon 12/15/2018   Anxiety    Chest pain 06/08/2018   Chronic kidney disease, stage 3a ([place]) 08/17/2020   Chronic left-sided low back pain with left-sided sciatica 09/08/2019   Chronic reflux esophagitis    Essential hypertension 01/29/2018   Gastroesophageal reflux disease with esophagitis without  hemorrhage 08/17/2020   Gastroesophageal reflux disease without esophagitis 09/08/2019   Hiatal hernia    History of colonic polyps 12/15/2018   History of hiatal hernia 08/17/2020   Hx of colonic polyp    Hypercholesterolemia    Hypertension    Hyponatremia 06/10/2018   Hypothyroidism    Hypothyroidism (acquired) 08/17/2020   Interstitial cystitis     Interstitial cystitis (chronic) without hematuria 06/19/2015   Lumbar disc herniation with radiculopathy 01/12/2020   Malaise and fatigue 09/08/2019   Moderate major depression ([place]) 09/08/2019   Osteoporosis    Renal insufficiency 06/10/2018   S/P TAVR (transcatheter aortic valve replacement)    23 mm Edwards Sapien 3 transcatheter heart valve placed via percutaneous right transfemoral approach    Severe aortic stenosis    Vitamin D deficiency     Past Surgical History:  Procedure Laterality Date   BACK SURGERY  12/2019   CHOLECYSTECTOMY     COLONOSCOPY  09/18/2011   Colonic polyp, status post polypectomy. Mild sigmoid diverticulosis. Otherwise grossly normal colonoscopy to terminal ileum    COLONOSCOPY WITH PROPOFOL N/A 02/18/2019   Procedure: COLONOSCOPY WITH PROPOFOL;  Surgeon: Rush Landmark Telford Nab., MD;  Location: [place];  Service: Gastroenterology;  Laterality: N/A;   ENDOSCOPIC MUCOSAL RESECTION N/A 02/18/2019   Procedure: ENDOSCOPIC MUCOSAL RESECTION;  Surgeon: Rush Landmark Telford Nab., MD;  Location: [place];  Service: Gastroenterology;  Laterality: N/A;   FOOT SURGERY     HEMOSTASIS CLIP PLACEMENT  02/18/2019   Procedure: HEMOSTASIS CLIP PLACEMENT;  Surgeon: Rush Landmark Telford Nab., MD;  Location: [facility] END[facility]COPY;  Service: Gastroenterology;;   INTRAOPERATIVE TRANSTHORACIC ECHOCARDIOGRAM N/A 03/03/2018   Procedure: INTRAOPERATIVE TRANSTHORACIC ECHOCARDIOGRAM;  Surgeon: Burnell Blanks, MD;  Location: [place];  Service: Open Heart Surgery;  Laterality: N/A;   POLYPECTOMY  02/18/2019   Procedure: POLYPECTOMY;  Surgeon: Rush Landmark Telford Nab., MD;  Location: [place];  Service: Gastroenterology;;   RIGHT/LEFT HEART CATH AND CORONARY ANGIOGRAPHY N/A 02/03/2018   Procedure: RIGHT/LEFT HEART CATH AND CORONARY ANGIOGRAPHY;  Surgeon: Martinique, Peter M, MD;  Location: [place] [facility];  Service: Cardiovascular;  Laterality: N/A;   SUBMUCOSAL LIFTING INJECTION   02/18/2019   Procedure: SUBMUCOSAL LIFTING INJECTION;  Surgeon: Rush Landmark Telford Nab., MD;  Location: [place];  Service: Gastroenterology;;   TRANSCATHETER AORTIC VALVE REPLACEMENT, TRANSFEMORAL  03/03/2018   TRANSCATHETER AORTIC VALVE REPLACEMENT, TRANSFEMORAL N/A 03/03/2018   Procedure: TRANSCATHETER AORTIC VALVE REPLACEMENT, TRANSFEMORAL;  Surgeon: Burnell Blanks, MD;  Location: [place];  Service: Open Heart Surgery;  Laterality: N/A;   TUBAL LIGATION      Family History  Problem Relation Age of Onset   Dementia Mother    COPD Father    Throat cancer Brother    Colon cancer Neg Hx    Esophageal cancer Neg Hx    Inflammatory bowel disease Neg Hx    Liver disease Neg Hx    Pancreatic cancer Neg Hx    Rectal cancer Neg Hx    Stomach cancer Neg Hx     Social History   Tobacco Use   Smoking status: Never   Smokeless tobacco: Never  Vaping Use   Vaping Use: Never used  Substance Use Topics   Alcohol use: No   Drug use: No    Current Outpatient Medications  Medication Sig Dispense Refill   acetaminophen (TYLENOL) 500 MG tablet Take 1 tablet by mouth every 6 (six) hours as needed for pain.     aspirin EC 81 MG tablet Take  81 mg by mouth daily.     b complex vitamins capsule Take 1 capsule by mouth daily.     busPIRone (BUSPAR) 5 MG tablet Take 5 mg by mouth 3 (three) times daily.     cetirizine (ZYRTEC) 10 MG tablet Take 10 mg by mouth at bedtime.     escitalopram (LEXAPRO) 20 MG tablet Take 20 mg by mouth daily.     levothyroxine (SYNTHROID, LEVOTHROID) 50 MCG tablet Take 50 mcg by mouth daily before breakfast.     metoprolol succinate (TOPROL XL) 25 MG 24 hr tablet Take 1 tablet (25 mg total) by mouth daily. 90 tablet 3   pantoprazole (PROTONIX) 40 MG tablet Take 1 tablet (40 mg total) by mouth daily. 30 tablet 6   rosuvastatin (CRESTOR) 40 MG tablet Take 40 mg by mouth at bedtime.      No current facility-administered medications for this visit.     Allergies  Allergen Reactions   Wellbutrin [Bupropion] Nausea Only    EXTREME NAUSEA   Levaquin [Levofloxacin] Nausea And Vomiting   Pregabalin Other (See Comments)    Confusion.    Review of Systems:  neg     Physical Exam:    BP 124/78   Pulse (!) 59   Wt 131 lb 4 oz (59.5 kg)   SpO2 96%   BMI 24.01 kg/m  Filed Weights   03/02/21 1124  Weight: 131 lb 4 oz (59.5 kg)   Gen: awake, alert, NAD HEENT: anicteric, no pallor CV: RRR, no mrg Pulm: CTA b/l Abd: soft, NT/ND, +BS throughout Ext: no c/c/e Neuro: nonfocal   Data Reviewed: I have personally reviewed following labs and imaging studies  CBC: CBC Latest Ref Rng & Units 02/15/2019 06/08/2018 03/04/2018  WBC 4.0 - 10.5 K/uL 4.3 4.9 4.8  Hemoglobin 12.0 - 15.0 g/dL 12.5 12.7 11.3(L)  Hematocrit 36.0 - 46.0 % 37.5 38.7 35.4(L)  Platelets 150.0 - 400.0 K/uL 124.0(L) 183 112(L)    CMP: CMP Latest Ref Rng & Units 02/15/2019 06/08/2018 03/04/2018  Glucose 70 - 99 mg/dL 100(H) 83 99  BUN 6 - 23 mg/dL 17 14 12   Creatinine 0.40 - 1.20 mg/dL 1.03 1.04(H) 0.91  Sodium 135 - 145 mEq/L 139 134(L) 139  Potassium 3.5 - 5.1 mEq/L 4.0 4.5 3.7  Chloride 96 - 112 mEq/L 104 102 110  CO2 19 - 32 mEq/L 26 23 26   Calcium 8.4 - 10.5 mg/dL 9.3 9.0 8.6(L)  Total Protein 6.5 - 8.1 g/dL - - -  Total Bilirubin 0.3 - 1.2 mg/dL - - -  Alkaline Phos 38 - 126 U/L - - -  AST 15 - 41 U/L - - -  ALT 0 - 44 U/L - - -      Carmell Austria, MD 03/02/2021, 11:29 AM  Cc: Gweneth Fritter, FNP

## 2021-03-02 NOTE — Telephone Encounter (Signed)
    Patient Name: Shannon Mills  DOB: 04-Nov-1940 MRN: [id number]  Primary Cardiologist: Jenean Lindau, MD  Chart reviewed as part of pre-operative protocol coverage. Given past medical history and time since last visit, based on ACC/AHA guidelines, Shannon Mills would be at acceptable risk for the planned procedure without further cardiovascular testing.   The patient was advised that if she develops new symptoms prior to surgery to contact our office to arrange for a follow-up visit, and she verbalized understanding.  I will route this recommendation to the requesting party via Epic fax function and remove from pre-op pool.  Please call with questions.  [place], [place] 03/02/2021, 2:02 PM

## 2021-03-02 NOTE — Telephone Encounter (Signed)
LVM. Patient is good to go for upcoming procedure. She can call with any questions or concerns.

## 2021-03-02 NOTE — Telephone Encounter (Signed)
[facility] Pre-operative Risk Assessment     Request for surgical clearance:     Endoscopy Procedure  What type of surgery is being performed?     EGD with Dil  When is this surgery scheduled?     04-04-2021  What type of clearance is required ?   Cardiac  Are there any medications that need to be held prior to surgery and how long? None. Patient isnt on a blood thinner but we need to a cardiac clearance to approve pt for procedure  Practice name and name of physician performing surgery?      [facility]  What is your office phone and fax number?      Phone- [phone]  [phone]  Anesthesia type (None, local, MAC, general) ?       MAC

## 2021-04-02 ENCOUNTER — Telehealth: Payer: Self-pay | Admitting: Gastroenterology

## 2021-04-02 NOTE — Telephone Encounter (Signed)
Hey Dr. Lyndel Safe,   Patient called in to cancel procedure 12/14 due to husband developing COVID. She rescheduled for 2/2.  Thank you

## 2021-04-04 ENCOUNTER — Encounter: Payer: Medicare Other | Admitting: Gastroenterology

## 2021-04-19 DIAGNOSIS — Z1231 Encounter for screening mammogram for malignant neoplasm of breast: Secondary | ICD-10-CM | POA: Diagnosis not present

## 2021-05-14 ENCOUNTER — Other Ambulatory Visit: Payer: Self-pay

## 2021-05-16 ENCOUNTER — Ambulatory Visit (INDEPENDENT_AMBULATORY_CARE_PROVIDER_SITE_OTHER): Payer: Medicare Other | Admitting: Cardiology

## 2021-05-16 ENCOUNTER — Other Ambulatory Visit: Payer: Self-pay

## 2021-05-16 ENCOUNTER — Encounter: Payer: Self-pay | Admitting: Cardiology

## 2021-05-16 VITALS — BP 140/66 | HR 76 | Resp 18 | Ht 60.0 in | Wt 130.0 lb

## 2021-05-16 DIAGNOSIS — E559 Vitamin D deficiency, unspecified: Secondary | ICD-10-CM

## 2021-05-16 DIAGNOSIS — I10 Essential (primary) hypertension: Secondary | ICD-10-CM

## 2021-05-16 DIAGNOSIS — E7800 Pure hypercholesterolemia, unspecified: Secondary | ICD-10-CM

## 2021-05-16 DIAGNOSIS — Z952 Presence of prosthetic heart valve: Secondary | ICD-10-CM

## 2021-05-16 DIAGNOSIS — N1831 Chronic kidney disease, stage 3a: Secondary | ICD-10-CM

## 2021-05-16 DIAGNOSIS — E039 Hypothyroidism, unspecified: Secondary | ICD-10-CM

## 2021-05-16 NOTE — Progress Notes (Signed)
Cardiology Office Note:    Date:  05/16/2021   ID:  Shannon Mills, DOB 10-Aug-1940, MRN [id number]  PCP:  Gweneth Fritter, FNP  Cardiologist:  Jenean Lindau, MD   Referring MD: Gweneth Fritter, FNP    ASSESSMENT:    1. Essential hypertension   2. Hypercholesterolemia   3. S/P TAVR (transcatheter aortic valve replacement)    PLAN:    In order of problems listed above:  Primary prevention stressed with the patient.  Importance of compliance with diet medication stressed and she vocalized understanding. Post TAVR: Stable at this time.  Its been a couple of years since echocardiogram was done and we will do an echocardiogram to understand I will review TAVR function and also overall cardiac anatomy and ejection fraction. Essential hypertension: Blood pressure stable and diet was emphasized.  Lifestyle modification and exercise stressed. Mixed dyslipidemia: We will have her lipid check when she comes for an echocardiogram and advise her accordingly.  She has history of vitamin D deficiency and we will do a blood level to check this. Patient will be seen in follow-up appointment in 9 months or earlier if the patient has any concerns   Medication Adjustments/Labs and Tests Ordered: Current medicines are reviewed at length with the patient today.  Concerns regarding medicines are outlined above.  No orders of the defined types were placed in this encounter.  No orders of the defined types were placed in this encounter.    Chief Complaint  Patient presents with   Follow-up     History of Present Illness:    Shannon Mills is a 81 y.o. female.  Patient has past medical history of severe aortic stenosis post TAVR intervention, essential hypertension and dyslipidemia.  She denies any problems at this time and takes care of activities of daily living.  No chest pain orthopnea or PND.  At the time of my evaluation, the patient is alert awake oriented and in no distress.  She ambulates  age appropriately.  Past Medical History:  Diagnosis Date   Abnormal colonoscopy 12/15/2018   Adenomatous polyp of ascending colon 12/15/2018   Anxiety    B12 deficiency 02/16/2021   Chest pain 06/08/2018   Chronic kidney disease, stage 3a ([place]) 08/17/2020   Chronic left-sided low back pain with left-sided sciatica 09/08/2019   Chronic reflux esophagitis    Essential hypertension 01/29/2018   GAD (generalized anxiety disorder) 02/16/2021   Gastroesophageal reflux disease with esophagitis without hemorrhage 08/17/2020   Gastroesophageal reflux disease without esophagitis 09/08/2019   Hiatal hernia    History of colonic polyps 12/15/2018   History of hiatal hernia 08/17/2020   Hx of colonic polyp    Hypercholesterolemia    Hypertension    Hyponatremia 06/10/2018   Hypothyroidism (acquired) 08/17/2020   Interstitial cystitis    Interstitial cystitis (chronic) without hematuria 06/19/2015   Lumbar disc herniation with radiculopathy 01/12/2020   Malaise and fatigue 09/08/2019   Moderate major depression ([place]) 09/08/2019   Osteoporosis    Renal insufficiency 06/10/2018   S/P TAVR (transcatheter aortic valve replacement)    23 mm Edwards Sapien 3 transcatheter heart valve placed via percutaneous right transfemoral approach    Seasonal allergic rhinitis 02/16/2021   Severe aortic stenosis    Vitamin D deficiency     Past Surgical History:  Procedure Laterality Date   BACK SURGERY  12/2019   CHOLECYSTECTOMY     COLONOSCOPY  09/18/2011   Colonic polyp, status post polypectomy. Mild  sigmoid diverticulosis. Otherwise grossly normal colonoscopy to terminal ileum    COLONOSCOPY WITH PROPOFOL N/A 02/18/2019   Procedure: COLONOSCOPY WITH PROPOFOL;  Surgeon: Rush Landmark Telford Nab., MD;  Location: [place];  Service: Gastroenterology;  Laterality: N/A;   ENDOSCOPIC MUCOSAL RESECTION N/A 02/18/2019   Procedure: ENDOSCOPIC MUCOSAL RESECTION;  Surgeon: Rush Landmark Telford Nab., MD;   Location: [place];  Service: Gastroenterology;  Laterality: N/A;   FOOT SURGERY     HEMOSTASIS CLIP PLACEMENT  02/18/2019   Procedure: HEMOSTASIS CLIP PLACEMENT;  Surgeon: Rush Landmark Telford Nab., MD;  Location: [facility] ENDOSCOPY;  Service: Gastroenterology;;   INTRAOPERATIVE TRANSTHORACIC ECHOCARDIOGRAM N/A 03/03/2018   Procedure: INTRAOPERATIVE TRANSTHORACIC ECHOCARDIOGRAM;  Surgeon: Burnell Blanks, MD;  Location: [place];  Service: Open Heart Surgery;  Laterality: N/A;   POLYPECTOMY  02/18/2019   Procedure: POLYPECTOMY;  Surgeon: Rush Landmark Telford Nab., MD;  Location: [place];  Service: Gastroenterology;;   RIGHT/LEFT HEART CATH AND CORONARY ANGIOGRAPHY N/A 02/03/2018   Procedure: RIGHT/LEFT HEART CATH AND CORONARY ANGIOGRAPHY;  Surgeon: Martinique, Peter M, MD;  Location: [place] CV LAB;  Service: Cardiovascular;  Laterality: N/A;   SUBMUCOSAL LIFTING INJECTION  02/18/2019   Procedure: SUBMUCOSAL LIFTING INJECTION;  Surgeon: Rush Landmark Telford Nab., MD;  Location: [place];  Service: Gastroenterology;;   TRANSCATHETER AORTIC VALVE REPLACEMENT, TRANSFEMORAL  03/03/2018   TRANSCATHETER AORTIC VALVE REPLACEMENT, TRANSFEMORAL N/A 03/03/2018   Procedure: TRANSCATHETER AORTIC VALVE REPLACEMENT, TRANSFEMORAL;  Surgeon: Burnell Blanks, MD;  Location: [place];  Service: Open Heart Surgery;  Laterality: N/A;   TUBAL LIGATION      Current Medications: Current Meds  Medication Sig   acetaminophen (TYLENOL) 500 MG tablet Take 1 tablet by mouth every 6 (six) hours as needed for pain.   aspirin EC 81 MG tablet Take 81 mg by mouth daily.   b complex vitamins capsule Take 1 capsule by mouth daily.   busPIRone (BUSPAR) 5 MG tablet Take 5 mg by mouth 3 (three) times daily.   cetirizine (ZYRTEC) 10 MG tablet Take 10 mg by mouth at bedtime.   cyanocobalamin (,VITAMIN B-12,) 1000 MCG/ML injection Inject 1,000 mcg into the muscle every 30 (thirty) days.   escitalopram (LEXAPRO) 20 MG  tablet Take 20 mg by mouth daily.   imipramine (TOFRANIL) 25 MG tablet Take 25 mg by mouth at bedtime.   levothyroxine (SYNTHROID, LEVOTHROID) 50 MCG tablet Take 50 mcg by mouth daily before breakfast.   metoprolol succinate (TOPROL XL) 25 MG 24 hr tablet Take 1 tablet (25 mg total) by mouth daily.   pantoprazole (PROTONIX) 40 MG tablet Take 1 tablet (40 mg total) by mouth daily.   rosuvastatin (CRESTOR) 40 MG tablet Take 40 mg by mouth at bedtime.      Allergies:   Wellbutrin [bupropion], Levaquin [levofloxacin], and Pregabalin   Social History   Socioeconomic History   Marital status: Married    Spouse name: Not on file   Number of children: 2   Years of education: Not on file   Highest education level: Not on file  Occupational History   Occupation: Retired-Office payroll  Tobacco Use   Smoking status: Never   Smokeless tobacco: Never  Vaping Use   Vaping Use: Never used  Substance and Sexual Activity   Alcohol use: No   Drug use: No   Sexual activity: Not on file  Other Topics Concern   Not on file  Social History Narrative   Not on file   Social Determinants of Health   Financial  Resource Strain: Not on file  Food Insecurity: Not on file  Transportation Needs: Not on file  Physical Activity: Not on file  Stress: Not on file  Social Connections: Not on file     Family History: The patient's family history includes COPD in her father; Dementia in her mother; Throat cancer in her brother. There is no history of Colon cancer, Esophageal cancer, Inflammatory bowel disease, Liver disease, Pancreatic cancer, Rectal cancer, or Stomach cancer.  ROS:   Please see the history of present illness.    All other systems reviewed and are negative.  EKGs/Labs/Other Studies Reviewed:    The following studies were reviewed today: I discussed my findings with the patient at length   Recent Labs: No results found for requested labs within last 8760 hours.  Recent Lipid  Panel No results found for: CHOL, TRIG, HDL, CHOLHDL, VLDL, LDLCALC, LDLDIRECT  Physical Exam:    VS:  BP 140/66 (BP Location: Right Arm, Patient Position: Sitting, Cuff Size: Normal)    Pulse 76    Resp 18    Ht 5' (1.524 m)    Wt 130 lb (59 kg)    SpO2 96%    BMI 25.39 kg/m     Wt Readings from Last 3 Encounters:  05/16/21 130 lb (59 kg)  03/02/21 131 lb 4 oz (59.5 kg)  11/07/20 132 lb 4 oz (60 kg)     GEN: Patient is in no acute distress HEENT: Normal NECK: No JVD; No carotid bruits LYMPHATICS: No lymphadenopathy CARDIAC: Hear sounds regular, 2/6 systolic murmur at the apex. RESPIRATORY:  Clear to auscultation without rales, wheezing or rhonchi  ABDOMEN: Soft, non-tender, non-distended MUSCULOSKELETAL:  No edema; No deformity  SKIN: Warm and dry NEUROLOGIC:  Alert and oriented x 3 PSYCHIATRIC:  Normal affect   Signed, Jenean Lindau, MD  05/16/2021 2:26 PM    [facility]

## 2021-05-16 NOTE — Patient Instructions (Signed)
Medication Instructions:  Your physician recommends that you continue on your current medications as directed. Please refer to the Current Medication list given to you today.  *If you need a refill on your cardiac medications before your next appointment, please call your pharmacy*   Lab Work: Your physician recommends that you return for lab work in:  You need to have labs done when you are fasting.  You can come Monday through Friday 8:30 am to 12:00 pm and 1:15 to 4:30. You do not need to make an appointment as the order has already been placed. The labs you are going to have done are BMET, CBC, TSH, vitamin D, A1C, LFT and Lipids.  If you have labs (blood work) drawn today and your tests are completely normal, you will receive your results only by: [place] (if you have MyChart) OR A paper copy in the mail If you have any lab test that is abnormal or we need to change your treatment, we will call you to review the results.   Testing/Procedures: Your physician has requested that you have an echocardiogram. Echocardiography is a painless test that uses sound waves to create images of your heart. It provides your doctor with information about the size and shape of your heart and how well your hearts chambers and valves are working. This procedure takes approximately one hour. There are no restrictions for this procedure.    Follow-Up: At [facility], you and your health needs are our priority.  As part of our continuing mission to provide you with exceptional heart care, we have created designated Provider Care Teams.  These Care Teams include your primary Cardiologist (physician) and Advanced Practice Providers (APPs -  Physician Assistants and Nurse Practitioners) who all work together to provide you with the care you need, when you need it.  We recommend signing up for the patient portal called "MyChart".  Sign up information is provided on this After Visit Summary.  MyChart is  used to connect with patients for Virtual Visits (Telemedicine).  Patients are able to view lab/test results, encounter notes, upcoming appointments, etc.  Non-urgent messages can be sent to your provider as well.   To learn more about what you can do with MyChart, go to [email or web address].    Your next appointment:   9 month(s)  The format for your next appointment:   In Person  Provider:   Jyl Heinz, MD   Other Instructions Echocardiogram An echocardiogram is a test that uses sound waves (ultrasound) to produce images of the heart. Images from an echocardiogram can provide important information about: Heart size and shape. The size and thickness and movement of your heart's walls. Heart muscle function and strength. Heart valve function or if you have stenosis. Stenosis is when the heart valves are too narrow. If blood is flowing backward through the heart valves (regurgitation). A tumor or infectious growth around the heart valves. Areas of heart muscle that are not working well because of poor blood flow or injury from a heart attack. Aneurysm detection. An aneurysm is a weak or damaged part of an artery wall. The wall bulges out from the normal force of blood pumping through the body. Tell a health care provider about: Any allergies you have. All medicines you are taking, including vitamins, herbs, eye drops, creams, and over-the-counter medicines. Any blood disorders you have. Any surgeries you have had. Any medical conditions you have. Whether you are pregnant or may be pregnant. What are the risks?  Generally, this is a safe test. However, problems may occur, including an allergic reaction to dye (contrast) that may be used during the test. What happens before the test? No specific preparation is needed. You may eat and drink normally. What happens during the test? You will take off your clothes from the waist up and put on a hospital gown. Electrodes or  electrocardiogram (ECG)patches may be placed on your chest. The electrodes or patches are then connected to a device that monitors your heart rate and rhythm. You will lie down on a table for an ultrasound exam. A gel will be applied to your chest to help sound waves pass through your skin. A handheld device, called a transducer, will be pressed against your chest and moved over your heart. The transducer produces sound waves that travel to your heart and bounce back (or "echo" back) to the transducer. These sound waves will be captured in real-time and changed into images of your heart that can be viewed on a video monitor. The images will be recorded on a computer and reviewed by your health care provider. You may be asked to change positions or hold your breath for a short time. This makes it easier to get different views or better views of your heart. In some cases, you may receive contrast through an IV in one of your veins. This can improve the quality of the pictures from your heart. The procedure may vary among health care providers and hospitals.   What can I expect after the test? You may return to your normal, everyday life, including diet, activities, and medicines, unless your health care provider tells you not to do that. Follow these instructions at home: It is up to you to get the results of your test. Ask your health care provider, or the department that is doing the test, when your results will be ready. Keep all follow-up visits. This is important. Summary An echocardiogram is a test that uses sound waves (ultrasound) to produce images of the heart. Images from an echocardiogram can provide important information about the size and shape of your heart, heart muscle function, heart valve function, and other possible heart problems. You do not need to do anything to prepare before this test. You may eat and drink normally. After the echocardiogram is completed, you may return to your  normal, everyday life, unless your health care provider tells you not to do that. This information is not intended to replace advice given to you by your health care provider. Make sure you discuss any questions you have with your health care provider. Document Revised: 11/30/2019 Document Reviewed: 11/30/2019 Elsevier Patient Education  2021 [employer].

## 2021-05-17 ENCOUNTER — Ambulatory Visit (INDEPENDENT_AMBULATORY_CARE_PROVIDER_SITE_OTHER): Payer: Medicare Other

## 2021-05-17 DIAGNOSIS — Z952 Presence of prosthetic heart valve: Secondary | ICD-10-CM | POA: Diagnosis not present

## 2021-05-17 LAB — ECHOCARDIOGRAM COMPLETE
AR max vel: 1.26 cm2
AV Area VTI: 1.29 cm2
AV Area mean vel: 1.27 cm2
AV Mean grad: 10 mmHg
AV Peak grad: 19 mmHg
Ao pk vel: 2.18 m/s
Area-P 1/2: 1.6 cm2
MV VTI: 1.17 cm2
S' Lateral: 2.5 cm

## 2021-05-18 LAB — CBC WITH DIFFERENTIAL/PLATELET
Basophils Absolute: 0 10*3/uL (ref 0.0–0.2)
Basos: 1 %
EOS (ABSOLUTE): 0.1 10*3/uL (ref 0.0–0.4)
Eos: 3 %
Hematocrit: 36.8 % (ref 34.0–46.6)
Hemoglobin: 11.8 g/dL (ref 11.1–15.9)
Immature Grans (Abs): 0 10*3/uL (ref 0.0–0.1)
Immature Granulocytes: 0 %
Lymphocytes Absolute: 1 10*3/uL (ref 0.7–3.1)
Lymphs: 28 %
MCH: 28.2 pg (ref 26.6–33.0)
MCHC: 32.1 g/dL (ref 31.5–35.7)
MCV: 88 fL (ref 79–97)
Monocytes Absolute: 0.3 10*3/uL (ref 0.1–0.9)
Monocytes: 9 %
Neutrophils Absolute: 2.1 10*3/uL (ref 1.4–7.0)
Neutrophils: 59 %
Platelets: 189 10*3/uL (ref 150–450)
RBC: 4.19 x10E6/uL (ref 3.77–5.28)
RDW: 14.5 % (ref 11.7–15.4)
WBC: 3.5 10*3/uL (ref 3.4–10.8)

## 2021-05-18 LAB — HEPATIC FUNCTION PANEL
ALT: 42 IU/L — ABNORMAL HIGH (ref 0–32)
AST: 46 IU/L — ABNORMAL HIGH (ref 0–40)
Albumin: 4.7 g/dL (ref 3.7–4.7)
Alkaline Phosphatase: 61 IU/L (ref 44–121)
Bilirubin Total: 0.4 mg/dL (ref 0.0–1.2)
Bilirubin, Direct: 0.11 mg/dL (ref 0.00–0.40)
Total Protein: 6.7 g/dL (ref 6.0–8.5)

## 2021-05-18 LAB — BASIC METABOLIC PANEL
BUN/Creatinine Ratio: 16 (ref 12–28)
BUN: 15 mg/dL (ref 8–27)
CO2: 25 mmol/L (ref 20–29)
Calcium: 9.6 mg/dL (ref 8.7–10.3)
Chloride: 102 mmol/L (ref 96–106)
Creatinine, Ser: 0.92 mg/dL (ref 0.57–1.00)
Glucose: 92 mg/dL (ref 70–99)
Potassium: 5.3 mmol/L — ABNORMAL HIGH (ref 3.5–5.2)
Sodium: 138 mmol/L (ref 134–144)
eGFR: 63 mL/min/{1.73_m2} (ref >59)

## 2021-05-18 LAB — LIPID PANEL
Chol/HDL Ratio: 2.2 ratio (ref 0.0–4.4)
Cholesterol, Total: 148 mg/dL (ref 100–199)
HDL: 66 mg/dL (ref >39)
LDL Chol Calc (NIH): 69 mg/dL (ref 0–99)
Triglycerides: 66 mg/dL (ref 0–149)
VLDL Cholesterol Cal: 13 mg/dL (ref 5–40)

## 2021-05-18 LAB — VITAMIN D 25 HYDROXY (VIT D DEFICIENCY, FRACTURES): Vit D, 25-Hydroxy: 28 ng/mL — ABNORMAL LOW (ref 30.0–100.0)

## 2021-05-18 LAB — TSH: TSH: 4.57 u[IU]/mL — ABNORMAL HIGH (ref 0.450–4.500)

## 2021-05-24 ENCOUNTER — Other Ambulatory Visit: Payer: Self-pay

## 2021-05-24 ENCOUNTER — Ambulatory Visit (AMBULATORY_SURGERY_CENTER): Payer: Medicare Other | Admitting: Gastroenterology

## 2021-05-24 ENCOUNTER — Encounter: Payer: Self-pay | Admitting: Gastroenterology

## 2021-05-24 VITALS — BP 105/59 | HR 60 | Temp 98.4°F | Resp 10 | Ht 62.0 in | Wt 131.0 lb

## 2021-05-24 DIAGNOSIS — R1013 Epigastric pain: Secondary | ICD-10-CM

## 2021-05-24 DIAGNOSIS — R1310 Dysphagia, unspecified: Secondary | ICD-10-CM | POA: Diagnosis not present

## 2021-05-24 DIAGNOSIS — K219 Gastro-esophageal reflux disease without esophagitis: Secondary | ICD-10-CM

## 2021-05-24 DIAGNOSIS — K449 Diaphragmatic hernia without obstruction or gangrene: Secondary | ICD-10-CM | POA: Diagnosis not present

## 2021-05-24 DIAGNOSIS — K222 Esophageal obstruction: Secondary | ICD-10-CM | POA: Diagnosis not present

## 2021-05-24 MED ORDER — SODIUM CHLORIDE 0.9 % IV SOLN: 500.0000 mL | Freq: Once | INTRAVENOUS | Status: DC

## 2021-05-24 NOTE — Progress Notes (Signed)
Chief Complaint: FU  Referring Provider:  Gweneth Fritter, FNP      ASSESSMENT AND PLAN;   #1. GERD with Schatzki's ring s/p EGD with dil to 50 Fr 10/27/2018.  #2.  Atypical chest pains- not cardiac per Dr Geraldo Pitter. H/O Severe AS s/p TAVR 03/03/2018 off plavix 08/2018. Followed by Dr Geraldo Pitter. 2DE- Nl, normally functioning TAVR.  #3.  Advanced colonic polyps (tubular adenoma s)s/p EMR by Dr. Rush Landmark 01/2019.  Recommended to get rpt colon in 6 months. Pt does not want any further colonoscopies.  She understands risks of developing colonic neoplasms.  Plan: -Continue protonix 40mg  po QD #30, 6 reffils. -EGD with dil. Will get clearence from Dr Geraldo Pitter. -Wants to hold off colon. She understands risk of developing colon Ca in future. -If still with problems, will give her a trial of GI cocktail. -Continue buspar. -D/W in detail with Shannon Mills.    HPI:    Shannon Mills is a 81 y.o. female  For follow-up visit. Accompanied by her daughter Shannon Mills  With atypical chest pains.  Seen by Dr. Melany Guernsey to be noncardiac.  Has been having occasional problems with solid food dysphagia.  There is some role of anxiety.  Shannon Ducking, FNP has started her on BuSpar with some relief.  Here for repeat EGD with dilatation as it did help her in the past.  She adamantly refuses to have follow-up colonoscopy.  She does understand that there is a small but definite risk of colorectal cancers especially given history of advanced colon polyps s/p piecemeal polypectomy.  She is willing to take that chance.  No nausea, vomiting, regurgitation, odynophagia.  No significant diarrhea or constipation.  There is no melena or hematochezia. No unintentional weight loss.      SH Husband- Shannon Mills is pt is ours. Great GM- Shannon Mills.  Her daughter Shannon Mills is a patient of ours.  Past GI procedures:  EGD 10/2018 Schatzki ring s/p esophageal dilatation to 50Fr - 2 cm hiatal hernia. - Mild gastritis. Neg  HP  Colonoscopy 01/2019 Dr. Rush Landmark - Hemorrhoids found on digital rectal exam. - One 12 mm polyp at the appendiceal orifice, removed with mucosal resection. Resected and retrieved. Clip (MR conditional) was placed. - One 35 mm polyp in the proximal ascending colon, removed with mucosal resection. Resected and retrieved. Clips (MR conditional) were placed. - Four 3 to 6 mm polyps in the transverse colon and in the ascending colon, removed with a cold snare. Resected and retrieved. - Non-bleeding non-thrombosed external and internal hemorrhoids. -Bx-tubular adenomas.  Repeat in 6 months  CTA 11/2020 -No acute findings -No proximal mesenteric artery occlusion   Additional past medical history: 1. Essential hypertension  2. Gastroesophageal reflux disease without esophagitis  3. Hypothyroidism (acquired)  4. Lumbar disc herniation with radiculopathy  5. Interstitial cystitis (chronic) without hematuria  6. 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 Mixed hyperlipidemia  9. Malaise and fatigue  10. B12 deficiency  11. History of aortic valve repair  12. Dysuria  13. History of hiatal hernia  Past Medical History:  Diagnosis Date   Abnormal colonoscopy 12/15/2018   Adenomatous polyp of ascending colon 12/15/2018   Anxiety    B12 deficiency 02/16/2021   Chest pain 06/08/2018   Chronic kidney disease, stage 3a ([place]) 08/17/2020   Chronic left-sided low back pain with left-sided sciatica 09/08/2019   Chronic reflux esophagitis    Essential hypertension 01/29/2018   GAD (  generalized anxiety disorder) 02/16/2021   Gastroesophageal reflux disease with esophagitis without hemorrhage 08/17/2020   Gastroesophageal reflux disease without esophagitis 09/08/2019   Hiatal hernia    History of colonic polyps 12/15/2018   History of hiatal hernia 08/17/2020   Hx of colonic polyp    Hypercholesterolemia    Hypertension    Hyponatremia  06/10/2018   Hypothyroidism (acquired) 08/17/2020   Interstitial cystitis    Interstitial cystitis (chronic) without hematuria 06/19/2015   Lumbar disc herniation with radiculopathy 01/12/2020   Malaise and fatigue 09/08/2019   Moderate major depression ([place]) 09/08/2019   Osteoporosis    Renal insufficiency 06/10/2018   S/P TAVR (transcatheter aortic valve replacement)    23 mm Edwards Sapien 3 transcatheter heart valve placed via percutaneous right transfemoral approach    Seasonal allergic rhinitis 02/16/2021   Severe aortic stenosis    Vitamin D deficiency     Past Surgical History:  Procedure Laterality Date   BACK SURGERY  12/2019   CHOLECYSTECTOMY     COLONOSCOPY  09/18/2011   Colonic polyp, status post polypectomy. Mild sigmoid diverticulosis. Otherwise grossly normal colonoscopy to terminal ileum    COLONOSCOPY WITH PROPOFOL N/A 02/18/2019   Procedure: COLONOSCOPY WITH PROPOFOL;  Surgeon: Rush Landmark Telford Nab., MD;  Location: [place];  Service: Gastroenterology;  Laterality: N/A;   ENDOSCOPIC MUCOSAL RESECTION N/A 02/18/2019   Procedure: ENDOSCOPIC MUCOSAL RESECTION;  Surgeon: Rush Landmark Telford Nab., MD;  Location: [place];  Service: Gastroenterology;  Laterality: N/A;   FOOT SURGERY     HEMOSTASIS CLIP PLACEMENT  02/18/2019   Procedure: HEMOSTASIS CLIP PLACEMENT;  Surgeon: Rush Landmark Telford Nab., MD;  Location: [facility];  Service: Gastroenterology;;   INTRAOPERATIVE TRANSTHORACIC ECHOCARDIOGRAM N/A 03/03/2018   Procedure: INTRAOPERATIVE TRANSTHORACIC ECHOCARDIOGRAM;  Surgeon: Burnell Blanks, MD;  Location: [place];  Service: Open Heart Surgery;  Laterality: N/A;   POLYPECTOMY  02/18/2019   Procedure: POLYPECTOMY;  Surgeon: Rush Landmark Telford Nab., MD;  Location: [place];  Service: Gastroenterology;;   RIGHT/LEFT HEART CATH AND CORONARY ANGIOGRAPHY N/A 02/03/2018   Procedure: RIGHT/LEFT HEART CATH AND CORONARY ANGIOGRAPHY;  Surgeon: Martinique,  Peter M, MD;  Location: [place] [facility];  Service: Cardiovascular;  Laterality: N/A;   SUBMUCOSAL LIFTING INJECTION  02/18/2019   Procedure: SUBMUCOSAL LIFTING INJECTION;  Surgeon: Rush Landmark Telford Nab., MD;  Location: [place];  Service: Gastroenterology;;   TRANSCATHETER AORTIC VALVE REPLACEMENT, TRANSFEMORAL  03/03/2018   TRANSCATHETER AORTIC VALVE REPLACEMENT, TRANSFEMORAL N/A 03/03/2018   Procedure: TRANSCATHETER AORTIC VALVE REPLACEMENT, TRANSFEMORAL;  Surgeon: Burnell Blanks, MD;  Location: [place];  Service: Open Heart Surgery;  Laterality: N/A;   TUBAL LIGATION      Family History  Problem Relation Age of Onset   Dementia Mother    COPD Father    Throat cancer Brother    Colon cancer Neg Hx    Esophageal cancer Neg Hx    Inflammatory bowel disease Neg Hx    Liver disease Neg Hx    Pancreatic cancer Neg Hx    Rectal cancer Neg Hx    Stomach cancer Neg Hx     Social History   Tobacco Use   Smoking status: Never   Smokeless tobacco: Never  Vaping Use   Vaping Use: Never used  Substance Use Topics   Alcohol use: No   Drug use: No    Current Outpatient Medications  Medication Sig Dispense Refill   aspirin EC 81 MG tablet Take 81 mg by mouth daily.  b complex vitamins capsule Take 1 capsule by mouth daily.     busPIRone (BUSPAR) 5 MG tablet Take 5 mg by mouth 3 (three) times daily.     cetirizine (ZYRTEC) 10 MG tablet Take 10 mg by mouth at bedtime.     cyanocobalamin (,VITAMIN B-12,) 1000 MCG/ML injection Inject 1,000 mcg into the muscle every 30 (thirty) days.     escitalopram (LEXAPRO) 20 MG tablet Take 20 mg by mouth daily.     imipramine (TOFRANIL) 25 MG tablet Take 25 mg by mouth at bedtime.     levothyroxine (SYNTHROID, LEVOTHROID) 50 MCG tablet Take 50 mcg by mouth daily before breakfast.     metoprolol succinate (TOPROL XL) 25 MG 24 hr tablet Take 1 tablet (25 mg total) by mouth daily. 90 tablet 3   pantoprazole (PROTONIX) 40 MG tablet Take  1 tablet (40 mg total) by mouth daily. 30 tablet 6   rosuvastatin (CRESTOR) 40 MG tablet Take 40 mg by mouth at bedtime.      acetaminophen (TYLENOL) 500 MG tablet Take 1 tablet by mouth every 6 (six) hours as needed for pain.     Current Facility-Administered Medications  Medication Dose Route Frequency Provider Last Rate Last Admin   0.9 %  sodium chloride infusion  500 mL Intravenous Once Jackquline Denmark, MD        Allergies  Allergen Reactions   Wellbutrin [Bupropion] Nausea Only    EXTREME NAUSEA   Levaquin [Levofloxacin] Nausea And Vomiting   Pregabalin Other (See Comments)    Confusion.    Review of Systems:  neg     Physical Exam:    BP 125/64    Pulse 66    Temp 98.4 F (36.9 C)    Ht 5\' 2"  (1.575 m)    Wt 131 lb (59.4 kg)    SpO2 97%    BMI 23.96 kg/m  Filed Weights   05/24/21 0910  Weight: 131 lb (59.4 kg)   Gen: awake, alert, NAD HEENT: anicteric, no pallor CV: RRR, no mrg Pulm: CTA b/l Abd: soft, NT/ND, +BS throughout Ext: no c/c/e Neuro: nonfocal   Data Reviewed: I have personally reviewed following labs and imaging studies  CBC: CBC Latest Ref Rng & Units 05/17/2021 02/15/2019 06/08/2018  WBC 3.4 - 10.8 x10E3/uL 3.5 4.3 4.9  Hemoglobin 11.1 - 15.9 g/dL 11.8 12.5 12.7  Hematocrit 34.0 - 46.6 % 36.8 37.5 38.7  Platelets 150 - 450 x10E3/uL 189 124.0(L) 183    CMP: CMP Latest Ref Rng & Units 05/17/2021 02/15/2019 06/08/2018  Glucose 70 - 99 mg/dL 92 100(H) 83  BUN 8 - 27 mg/dL 15 17 14   Creatinine 0.57 - 1.00 mg/dL 0.92 1.03 1.04(H)  Sodium 134 - 144 mmol/L 138 139 134(L)  Potassium 3.5 - 5.2 mmol/L 5.3(H) 4.0 4.5  Chloride 96 - 106 mmol/L 102 104 102  CO2 20 - 29 mmol/L 25 26 23   Calcium 8.7 - 10.3 mg/dL 9.6 9.3 9.0  Total Protein 6.0 - 8.5 g/dL 6.7 - -  Total Bilirubin 0.0 - 1.2 mg/dL 0.4 - -  Alkaline Phos 44 - 121 IU/L 61 - -  AST 0 - 40 IU/L 46(H) - -  ALT 0 - 32 IU/L 42(H) - -      Carmell Austria, MD 05/24/2021, 10:09 AM  Cc: Shannon Fritter,  FNP

## 2021-05-24 NOTE — Patient Instructions (Addendum)
Handouts were given to your care partner on a Hiatal Hernia and the Esophageal Dilatation Diet to follow the rest of today. You may resume your current medications today. Per Dr. Lyndel Safe, continue taking PROTONIX 40 mg daily and can use IcyHot/Biofreeze twice daily for musculoskeletal pain. Please call if any questions or concerns.     YOU HAD AN ENDOSCOPIC PROCEDURE TODAY AT [facility]:   Refer to the procedure report that was given to you for any specific questions about what was found during the examination.  If the procedure report does not answer your questions, please call your gastroenterologist to clarify.  If you requested that your care partner not be given the details of your procedure findings, then the procedure report has been included in a sealed envelope for you to review at your convenience later.  YOU SHOULD EXPECT: Some feelings of bloating in the abdomen. Passage of more gas than usual.  Walking can help get rid of the air that was put into your GI tract during the procedure and reduce the bloating. If you had a lower endoscopy (such as a colonoscopy or flexible sigmoidoscopy) you may notice spotting of blood in your stool or on the toilet paper. If you underwent a bowel prep for your procedure, you may not have a normal bowel movement for a few days.  Please Note:  You might notice some irritation and congestion in your nose or some drainage.  This is from the oxygen used during your procedure.  There is no need for concern and it should clear up in a day or so.  SYMPTOMS TO REPORT IMMEDIATELY:  Following upper endoscopy (EGD)  Vomiting of blood or coffee ground material  New chest pain or pain under the shoulder blades  Painful or persistently difficult swallowing  New shortness of breath  Fever of 100F or higher  Black, tarry-looking stools  For urgent or emergent issues, a gastroenterologist can be reached at any hour by calling [phone]. Do not  use MyChart messaging for urgent concerns.    DIET:  Please follow the Esophageal Dilatation Diet the rest of the day.  A handout was given to your daughter.  Drink plenty of fluids but you should avoid alcoholic beverages for 24 hours.  ACTIVITY:  You should plan to take it easy for the rest of today and you should NOT DRIVE or use heavy machinery until tomorrow (because of the sedation medicines used during the test).    FOLLOW UP: Our staff will call the number listed on your records 48-72 hours following your procedure to check on you and address any questions or concerns that you may have regarding the information given to you following your procedure. If we do not reach you, we will leave a message.  We will attempt to reach you two times.  During this call, we will ask if you have developed any symptoms of COVID 19. If you develop any symptoms (ie: fever, flu-like symptoms, shortness of breath, cough etc.) before then, please call [phone].  If you test positive for Covid 19 in the 2 weeks post procedure, please call and report this information to [place].    If any biopsies were taken you will be contacted by phone or by letter within the next 1-3 weeks.  Please call us at [phone] if you have not heard about the biopsies in 3 weeks.    SIGNATURES/CONFIDENTIALITY: You and/or your care partner have signed paperwork which will be  entered into your electronic medical record.  These signatures attest to the fact that that the information above on your After Visit Summary has been reviewed and is understood.  Full responsibility of the confidentiality of this discharge information lies with you and/or your care-partner.

## 2021-05-24 NOTE — Op Note (Signed)
[facility] Patient Name: Shannon Mills Procedure Date: 05/24/2021 9:41 AM MRN: [id number] Endoscopist: Jackquline Denmark , MD Age: 81 Referring MD:  Date of Birth: 09/27/1940 Gender: Female Account #: [id number] Procedure:                Upper GI endoscopy Indications:              Epigastric abdominal pain, Dysphagia Medicines:                Monitored Anesthesia Care Procedure:                Pre-Anesthesia Assessment:                           - Prior to the procedure, a History and Physical                            was performed, and patient medications and                            allergies were reviewed. The patient's tolerance of                            previous anesthesia was also reviewed. The risks                            and benefits of the procedure and the sedation                            options and risks were discussed with the patient.                            All questions were answered, and informed consent                            was obtained. Prior Anticoagulants: The patient has                            taken no previous anticoagulant or antiplatelet                            agents. ASA Grade Assessment: III - A patient with                            severe systemic disease. After reviewing the risks                            and benefits, the patient was deemed in                            satisfactory condition to undergo the procedure.                           After obtaining informed consent, the endoscope was  passed under direct vision. Throughout the                            procedure, the patient's blood pressure, pulse, and                            oxygen saturations were monitored continuously. The                            GIF [id number] #[id number] was introduced through the                            mouth, and advanced to the second part of duodenum.                            The upper GI  endoscopy was accomplished without                            difficulty. The patient tolerated the procedure                            well. Scope In: Scope Out: Findings:                 A mild Schatzki ring was found at the                            gastroesophageal junction. The scope was withdrawn.                            Dilation was performed with a Maloney dilator with                            mild resistance at 50 Fr and 52 Fr.                           A 2 cm hiatal hernia was present.                           The exam of the stomach was otherwise normal.                           The examined duodenum was normal. Complications:            No immediate complications. Estimated Blood Loss:     Estimated blood loss: none. Impression:               - Mild Schatzki ring. Dilated.                           - 2 cm hiatal hernia.                           - No specimens collected. Recommendation:           - Patient has a contact number available for  emergencies. The signs and symptoms of potential                            delayed complications were discussed with the                            patient. Return to normal activities tomorrow.                            Written discharge instructions were provided to the                            patient.                           - Post dilatation diet.                           - Continue present medications.                           - Can use IcyHot/Biofreeze twice daily for                            musculoskeletal pain                           - The findings and recommendations were discussed                            with [facility]. Jackquline Denmark, MD 05/24/2021 10:35:02 AM This report has been signed electronically.

## 2021-05-24 NOTE — Progress Notes (Signed)
Called to room to assist during endoscopic procedure.  Patient ID and intended procedure confirmed with present staff. Received instructions for my participation in the procedure from the performing physician.  

## 2021-05-24 NOTE — Progress Notes (Signed)
[facility]  Pt's states no medical or surgical changes since previsit or office visit.  

## 2021-05-24 NOTE — Progress Notes (Signed)
No problems noted in the recovery room. maw 

## 2021-05-24 NOTE — Progress Notes (Signed)
To pacu, VSS. Report to [email or web address].tb °

## 2021-05-28 ENCOUNTER — Telehealth: Payer: Self-pay

## 2021-05-28 NOTE — Telephone Encounter (Signed)
°  Follow up Call-  Call back number 05/24/2021 10/27/2018  Post procedure Call Back phone  # [phone]  Permission to leave phone message Yes Yes  Some recent data might be hidden     Patient questions:  Do you have a fever, pain , or abdominal swelling? No. Pain Score  0 *  Have you tolerated food without any problems? Yes.    Have you been able to return to your normal activities? Yes.    Do you have any questions about your discharge instructions: Diet   No. Medications  No. Follow up visit  No.  Do you have questions or concerns about your Care? No.  Actions: * If pain score is 4 or above: No action needed, pain <4.

## 2022-02-21 DIAGNOSIS — R053 Chronic cough: Secondary | ICD-10-CM

## 2022-02-21 HISTORY — DX: Chronic cough: R05.3

## 2022-05-06 ENCOUNTER — Ambulatory Visit: Payer: Medicare Other | Attending: Cardiology | Admitting: Cardiology

## 2022-05-06 VITALS — BP 144/84 | HR 71 | Ht 63.0 in | Wt 137.8 lb

## 2022-05-06 DIAGNOSIS — I350 Nonrheumatic aortic (valve) stenosis: Secondary | ICD-10-CM | POA: Insufficient documentation

## 2022-05-06 DIAGNOSIS — E7800 Pure hypercholesterolemia, unspecified: Secondary | ICD-10-CM | POA: Diagnosis not present

## 2022-05-06 DIAGNOSIS — Z952 Presence of prosthetic heart valve: Secondary | ICD-10-CM | POA: Insufficient documentation

## 2022-05-06 DIAGNOSIS — I10 Essential (primary) hypertension: Secondary | ICD-10-CM | POA: Diagnosis not present

## 2022-05-06 NOTE — Patient Instructions (Signed)

## 2022-05-06 NOTE — Progress Notes (Signed)
Cardiology Office Note:    Date:  05/06/2022   ID:  Shannon Mills, DOB Jan 30, 1941, MRN [id number]  PCP:  Gweneth Fritter, FNP  Cardiologist:  Jenean Lindau, MD   Referring MD: Gweneth Fritter, FNP    ASSESSMENT:    1. 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 Hypercholesterolemia    PLAN:    In order of problems listed above:  Primary prevention stressed with the patient.  Importance of compliance with diet medication stressed and she vocalized understanding. Essential hypertension: Blood pressure stable and diet was emphasized.  Lifestyle modification urged. Post TAVR: Stable at this time.  Echo report reviewed with her at length and questions were answered to her satisfaction. Mixed dyslipidemia: On statin therapy.  Followed by primary care.  Lipids reviewed and discussed with the patient at length. She was advised to ambulate to the best of her ability.  She promises to do so.Patient will be seen in follow-up appointment in 12 months or earlier if the patient has any concerns    Medication Adjustments/Labs and Tests Ordered: Current medicines are reviewed at length with the patient today.  Concerns regarding medicines are outlined above.  No orders of the defined types were placed in this encounter.  No orders of the defined types were placed in this encounter.    No chief complaint on file.    History of Present Illness:    Shannon Mills is a 82 y.o. female.  Patient has past medical history of aortic stenosis post-TAVR, essential hypertension and dyslipidemia.  She denies any problems at this time and takes care of activities of daily living.  No chest pain orthopnea or PND.  At the time of my evaluation, the patient is alert awake oriented and in no distress.  She ambulates on as permitted by her health.  Overall she does well with activities of daily living.  No chest pain orthopnea PND.  Past Medical History:   Diagnosis Date   Abnormal colonoscopy 12/15/2018   Adenomatous polyp of ascending colon 12/15/2018   Anxiety    B12 deficiency 02/16/2021   Chest pain 06/08/2018   Chronic coughing 02/21/2022   Chronic kidney disease, stage 3a ([place]) 08/17/2020   Chronic left-sided low back pain with left-sided sciatica 09/08/2019   Chronic reflux esophagitis    Essential hypertension 01/29/2018   GAD (generalized anxiety disorder) 02/16/2021   Gastroesophageal reflux disease with esophagitis without hemorrhage 08/17/2020   Gastroesophageal reflux disease without esophagitis 09/08/2019   Hiatal hernia    History of colonic polyps 12/15/2018   History of hiatal hernia 08/17/2020   Hx of colonic polyp    Hypercholesterolemia    Hypertension    Hyponatremia 06/10/2018   Hypothyroidism (acquired) 08/17/2020   Interstitial cystitis    Interstitial cystitis (chronic) without hematuria 06/19/2015   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 Mild sigmoid  diverticulosis. Otherwise grossly normal colonoscopy to terminal ileum    COLONOSCOPY WITH PROPOFOL N/A 02/18/2019   Procedure: COLONOSCOPY WITH PROPOFOL;  Surgeon: Rush Landmark Telford Nab., MD;  Location: [place];  Service: Gastroenterology;  Laterality: N/A;   ENDOSCOPIC MUCOSAL RESECTION N/A 02/18/2019   Procedure: ENDOSCOPIC MUCOSAL RESECTION;  Surgeon: Rush Landmark Telford Nab., MD;  Location: [place];  Service: Gastroenterology;  Laterality: N/A;   FOOT SURGERY     HEMOSTASIS CLIP PLACEMENT  02/18/2019   Procedure: HEMOSTASIS CLIP PLACEMENT;  Surgeon: Rush Landmark Telford Nab., MD;  Location: [facility] ENDOSCOPY;  Service: Gastroenterology;;   INTRAOPERATIVE TRANSTHORACIC ECHOCARDIOGRAM N/A 03/03/2018   Procedure: INTRAOPERATIVE TRANSTHORACIC ECHOCARDIOGRAM;  Surgeon: Burnell Blanks, MD;  Location: [place];  Service: Open Heart Surgery;  Laterality: N/A;   POLYPECTOMY  02/18/2019   Procedure: POLYPECTOMY;  Surgeon: Rush Landmark Telford Nab., MD;  Location: [place];  Service: Gastroenterology;;   RIGHT/LEFT HEART CATH AND CORONARY ANGIOGRAPHY N/A 02/03/2018   Procedure: RIGHT/LEFT HEART CATH AND CORONARY ANGIOGRAPHY;  Surgeon: Martinique, Peter M, MD;  Location: [place] CV LAB;  Service: Cardiovascular;  Laterality: N/A;   SUBMUCOSAL LIFTING INJECTION  02/18/2019   Procedure: SUBMUCOSAL LIFTING INJECTION;  Surgeon: Rush Landmark Telford Nab., MD;  Location: [place];  Service: Gastroenterology;;   TRANSCATHETER AORTIC VALVE REPLACEMENT, TRANSFEMORAL  03/03/2018   TRANSCATHETER AORTIC VALVE REPLACEMENT, TRANSFEMORAL N/A 03/03/2018   Procedure: TRANSCATHETER AORTIC VALVE REPLACEMENT, TRANSFEMORAL;  Surgeon: Burnell Blanks, MD;  Location: [place];  Service: Open Heart Surgery;  Laterality: N/A;   TUBAL LIGATION      Current Medications: Current Meds  Medication Sig   acetaminophen (TYLENOL) 500 MG tablet Take 1 tablet by mouth every 6 (six) hours as needed for pain.   aspirin EC 81 MG tablet Take 81 mg by mouth daily.   b complex vitamins capsule Take 1 capsule by mouth daily.   busPIRone (BUSPAR) 5 MG tablet Take 5 mg by mouth 3 (three) times daily.   cetirizine (ZYRTEC) 10 MG tablet Take 10 mg by mouth at bedtime.   cyanocobalamin (,VITAMIN B-12,) 1000 MCG/ML injection Inject 1,000 mcg into the muscle every 30 (thirty) days.   escitalopram (LEXAPRO) 20 MG tablet Take  20 mg by mouth daily.   imipramine (TOFRANIL) 25 MG tablet Take 25 mg by mouth at bedtime.   levothyroxine (SYNTHROID, LEVOTHROID) 50 MCG tablet Take 50 mcg by mouth daily before breakfast.   metoprolol succinate (TOPROL XL) 25 MG 24 hr tablet Take 1 tablet (25 mg total) by mouth daily.   pantoprazole (PROTONIX) 40 MG tablet Take 1 tablet (40 mg total) by mouth daily.   rosuvastatin (CRESTOR) 40 MG tablet Take 40 mg by mouth at bedtime.      Allergies:   Wellbutrin [bupropion], Levaquin [levofloxacin], and Pregabalin   Social History   Socioeconomic History   Marital status: Married    Spouse name: Not on file   Number of children: 2   Years of education: Not on file   Highest education level: Not on file  Occupational History   Occupation: Retired-Office payroll  Tobacco Use   Smoking status: Never   Smokeless tobacco: Never  Vaping Use   Vaping Use: Never used  Substance and Sexual Activity   Alcohol use: No   Drug use: No   Sexual activity: Not on file  Other Topics Concern   Not on file  Social History Narrative   Not on file   Social Determinants of Health   Financial Resource  Strain: Not on file  Food Insecurity: Not on file  Transportation Needs: Not on file  Physical Activity: Not on file  Stress: Not on file  Social Connections: Not on file     Family History: The patient's family history includes COPD in her father; Dementia in her mother; Throat cancer in her brother. There is no history of Colon cancer, Esophageal cancer, Inflammatory bowel disease, Liver disease, Pancreatic cancer, Rectal cancer, or Stomach cancer.  ROS:   Please see the history of present illness.    All other systems reviewed and are negative.  EKGs/Labs/Other Studies Reviewed:    The following studies were reviewed today: EKG reveals sinus rhythm and nonspecific ST-T changes   Recent Labs: 05/17/2021: ALT 42; BUN 15; Creatinine, Ser 0.92; Hemoglobin 11.8; Platelets 189;  Potassium 5.3; Sodium 138; TSH 4.570  Recent Lipid Panel    Component Value Date/Time   CHOL 148 05/17/2021 0839   TRIG 66 05/17/2021 0839   HDL 66 05/17/2021 0839   CHOLHDL 2.2 05/17/2021 0839   LDLCALC 69 05/17/2021 0839    Physical Exam:    VS:  BP (!) 144/84   Pulse 71   Ht '5\' 3"'$  (1.6 m)   Wt 137 lb 12.8 oz (62.5 kg)   SpO2 99%   BMI 24.41 kg/m     Wt Readings from Last 3 Encounters:  05/06/22 137 lb 12.8 oz (62.5 kg)  05/24/21 131 lb (59.4 kg)  05/16/21 130 lb (59 kg)     GEN: Patient is in no acute distress HEENT: Normal NECK: No JVD; No carotid bruits LYMPHATICS: No lymphadenopathy CARDIAC: Hear sounds regular, 2/6 systolic murmur at the apex. RESPIRATORY:  Clear to auscultation without rales, wheezing or rhonchi  ABDOMEN: Soft, non-tender, non-distended MUSCULOSKELETAL:  No edema; No deformity  SKIN: Warm and dry NEUROLOGIC:  Alert and oriented x 3 PSYCHIATRIC:  Normal affect   Signed, Jenean Lindau, MD  05/06/2022 3:35 PM    [facility]

## 2022-11-26 ENCOUNTER — Ambulatory Visit: Payer: Medicare Other | Admitting: Gastroenterology

## 2022-12-04 DIAGNOSIS — I361 Nonrheumatic tricuspid (valve) insufficiency: Secondary | ICD-10-CM | POA: Diagnosis not present

## 2023-06-12 ENCOUNTER — Ambulatory Visit: Payer: Medicare Other | Admitting: Cardiology

## 2023-07-01 ENCOUNTER — Ambulatory Visit: Payer: Medicare Other | Attending: Cardiology | Admitting: Cardiology

## 2023-07-01 ENCOUNTER — Encounter: Payer: Self-pay | Admitting: Cardiology

## 2023-07-01 VITALS — BP 136/80 | HR 75 | Ht 60.0 in | Wt 121.0 lb

## 2023-07-01 DIAGNOSIS — E7800 Pure hypercholesterolemia, unspecified: Secondary | ICD-10-CM | POA: Insufficient documentation

## 2023-07-01 DIAGNOSIS — I10 Essential (primary) hypertension: Secondary | ICD-10-CM | POA: Diagnosis not present

## 2023-07-01 DIAGNOSIS — Z952 Presence of prosthetic heart valve: Secondary | ICD-10-CM | POA: Insufficient documentation

## 2023-07-01 NOTE — Patient Instructions (Signed)
 Medication Instructions:  Your physician recommends that you continue on your current medications as directed. Please refer to the Current Medication list given to you today.  *If you need a refill on your cardiac medications before your next appointment, please call your pharmacy*   Lab Work: None Ordered If you have labs (blood work) drawn today and your tests are completely normal, you will receive your results only by: MyChart Message (if you have MyChart) OR A paper copy in the mail If you have any lab test that is abnormal or we need to change your treatment, we will call you to review the results.   Testing/Procedures: None Ordered   Follow-Up: At [facility], you and your health needs are our priority.  As part of our continuing mission to provide you with exceptional heart care, we have created designated Provider Care Teams.  These Care Teams include your primary Cardiologist (physician) and Advanced Practice Providers (APPs -  Physician Assistants and Nurse Practitioners) who all work together to provide you with the care you need, when you need it.  We recommend signing up for the patient portal called "MyChart".  Sign up information is provided on this After Visit Summary.  MyChart is used to connect with patients for Virtual Visits (Telemedicine).  Patients are able to view lab/test results, encounter notes, upcoming appointments, etc.  Non-urgent messages can be sent to your provider as well.   To learn more about what you can do with MyChart, go to [email or web address].    Your next appointment:   12 month follow up

## 2023-07-01 NOTE — Progress Notes (Signed)
 Cardiology Office Note:    Date:  07/01/2023   ID:  Shannon Mills, DOB 10/10/1940, MRN [id number]  PCP:  Audie Pinto, FNP  Cardiologist:  Garwin Brothers, MD   Referring MD: Audie Pinto, FNP    ASSESSMENT:    1. 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 Hypercholesterolemia    PLAN:    In order of problems listed above:  Primary prevention stressed with the patient.  Importance of compliance with diet medication stressed and patient verbalized standing. Essential hypertension: Blood pressure is stable and diet was emphasized.  Diet salt intake issues were discussed and questions were answered to her satisfaction. Mixed dyslipidemia: On lipid-lowering medications followed by primary care.  Lipids reviewed from [facility] sheet.  Diet emphasized. Post TAVR: Echo report reviewed from last evaluation and discussed with her.  Valve appears stable.  We will continue to monitor. Patient will be seen in follow-up appointment in 12 months or earlier if the patient has any concerns.    Medication Adjustments/Labs and Tests Ordered: Current medicines are reviewed at length with the patient today.  Concerns regarding medicines are outlined above.  Orders Placed This Encounter  Procedures   EKG 12-Lead   No orders of the defined types were placed in this encounter.    Chief Complaint  Patient presents with   Follow-up     History of Present Illness:    Shannon Mills is a 83 y.o. female.  Patient has past medical history of essential hypertension, post TAVR for aortic stenosis and hypercholesterolemia.  She denies any problems at this time and takes care of activities of daily living.  She ambulates on a regular basis.  No chest pain orthopnea or PND.  At the time of my evaluation, the patient is alert awake oriented and in no distress.  Past Medical History:  Diagnosis Date   Abnormal colonoscopy 12/15/2018   Adenomatous polyp of  ascending colon 12/15/2018   Anxiety    B12 deficiency 02/16/2021   Chest pain 06/08/2018   Chronic coughing 02/21/2022   Chronic kidney disease, stage 3a ([facility]) 08/17/2020   Chronic left-sided low back pain with left-sided sciatica 09/08/2019   Chronic reflux esophagitis    Essential hypertension 01/29/2018   GAD (generalized anxiety disorder) 02/16/2021   Gastroesophageal reflux disease with esophagitis without hemorrhage 08/17/2020   Gastroesophageal reflux disease without esophagitis 09/08/2019   Hiatal hernia    History of colonic polyps 12/15/2018   History of hiatal hernia 08/17/2020   Hx of colonic polyp    Hypercholesterolemia    Hypertension    Hyponatremia 06/10/2018   Hypothyroidism (acquired) 08/17/2020   Interstitial cystitis    Interstitial cystitis (chronic) without hematuria 06/19/2015   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 Mild sigmoid diverticulosis. Otherwise grossly normal colonoscopy to terminal ileum    COLONOSCOPY WITH PROPOFOL N/A 02/18/2019  Procedure: COLONOSCOPY WITH PROPOFOL;  Surgeon: Mansouraty, Netty Starring., MD;  Location: [facility] ENDOSCOPY;  Service: Gastroenterology;  Laterality: N/A;   ENDOSCOPIC MUCOSAL RESECTION N/A 02/18/2019   Procedure: ENDOSCOPIC MUCOSAL RESECTION;  Surgeon: Meridee Score Netty Starring., MD;  Location: [facility] ENDOSCOPY;  Service: Gastroenterology;  Laterality: N/A;   FOOT SURGERY      HEMOSTASIS CLIP PLACEMENT  02/18/2019   Procedure: HEMOSTASIS CLIP PLACEMENT;  Surgeon: Meridee Score Netty Starring., MD;  Location[facility] END[facility]COPY;  Service: Gastroenterology;;   INTRAOPERATIVE TRANSTHORACIC ECHOCARDIOGRAM N/A 03/03/2018   Procedure: INTRAOPERATIVE TRANSTHORACIC ECHOCARDIOGRAM;  Surgeon: Kathleene Hazel, MD;  Location: [facility] OR;  Service: Open Heart Surgery;  Laterality: N/A;   POLYPECTOMY  02/18/2019   Procedure: POLYPECTOMY;  Surgeon: Meridee Score Netty Starring., MD;  Location: [facility] ENDOSCOPY;  Service: Gastroenterology;;   RIGHT/LEFT HEART CATH AND CORONARY ANGIOGRAPHY N/A 02/03/2018   Procedure: RIGHT/LEFT HEART CATH AND CORONARY ANGIOGRAPHY;  Surgeon: Swaziland, Peter M, MD;  Location: [facility] INVASIVE CV LAB;  Service: Cardiovascular;  Laterality: N/A;   SUBMUCOSAL LIFTING INJECTION  02/18/2019   Procedure: SUBMUCOSAL LIFTING INJECTION;  Surgeon: Meridee Score Netty Starring., MD;  Location[facility] ENDOSCOPY;  Service: Gastroenterology;;   TRANSCATHETER AORTIC VALVE REPLACEMENT, TRANSFEMORAL  03/03/2018   TRANSCATHETER AORTIC VALVE REPLACEMENT, TRANSFEMORAL N/A 03/03/2018   Procedure: TRANSCATHETER AORTIC VALVE REPLACEMENT, TRANSFEMORAL;  Surgeon: Kathleene Hazel, MD;  Location: [facility] OR;  Service: Open Heart Surgery;  Laterality: N/A;   TUBAL LIGATION      Current Medications: Current Meds  Medication Sig   acetaminophen (TYLENOL) 500 MG tablet Take 1 tablet by mouth every 6 (six) hours as needed for pain.   aspirin EC 81 MG tablet Take 81 mg by mouth daily.   b complex vitamins capsule Take 1 capsule by mouth daily.   busPIRone (BUSPAR) 5 MG tablet Take 5 mg by mouth 3 (three) times daily.   cyanocobalamin (,VITAMIN B-12,) 1000 MCG/ML injection Inject 1,000 mcg into the muscle every 30 (thirty) days.   escitalopram (LEXAPRO) 20 MG tablet Take 20 mg by mouth daily.   imipramine (TOFRANIL) 25 MG tablet Take 25 mg by mouth at bedtime.   levothyroxine (SYNTHROID, LEVOTHROID) 50 MCG tablet  Take 50 mcg by mouth daily before breakfast.   metoprolol succinate (TOPROL XL) 25 MG 24 hr tablet Take 1 tablet (25 mg total) by mouth daily.   pantoprazole (PROTONIX) 40 MG tablet Take 1 tablet (40 mg total) by mouth daily.   rosuvastatin (CRESTOR) 40 MG tablet Take 40 mg by mouth at bedtime.    [DISCONTINUED] cetirizine (ZYRTEC) 10 MG tablet Take 10 mg by mouth at bedtime.     Allergies:   Wellbutrin [bupropion], Levaquin [levofloxacin], and Pregabalin   Social History   Socioeconomic History   Marital status: Married    Spouse name: Not on file   Number of children: 2   Years of education: Not on file   Highest education level: Not on file  Occupational History   Occupation: Retired-Office payroll  Tobacco Use   Smoking status: Never   Smokeless tobacco: Never  Vaping Use   Vaping status: Never Used  Substance and Sexual Activity   Alcohol use: No   Drug use: No   Sexual activity: Not on file  Other Topics Concern   Not on file  Social History Narrative   Not on file   Social Drivers of Health   Financial Resource Strain: Not on file  Food Insecurity: Low Risk  (12/09/2022)   Received from [facility]  Hunger Vital Sign    Worried About Running Out of Food in the Last Year: Never true    Ran Out of Food in the Last Year: Never true  Transportation Needs: No Transportation Needs (12/09/2022)   Received from [employer]    In the past 12 months, has lack of reliable transportation kept you from medical appointments, meetings, work or from getting things needed for daily living? : No  Physical Activity: Not on file  Stress: Not on file  Social Connections: Not on file     Family History: The patient's family history includes COPD in her father; Dementia in her mother; Throat cancer in her brother. There is no history of Colon cancer, Esophageal cancer, Inflammatory bowel disease, Liver disease, Pancreatic cancer, Rectal cancer, or Stomach  cancer.  ROS:   Please see the history of present illness.    All other systems reviewed and are negative.  EKGs/Labs/Other Studies Reviewed:    The following studies were reviewed today: .Marland KitchenEKG Interpretation Date/Time:  Tuesday July 01 2023 13:31:20 EDT Ventricular Rate:  75 PR Interval:  164 QRS Duration:  86 QT Interval:  402 QTC Calculation: 448 R Axis:   63  Text Interpretation: Normal sinus rhythm Normal ECG When compared with ECG of 08-Jun-2018 12:16, No significant change was found Confirmed by Belva Crome [phone]) on 07/01/2023 1:40:27 PM     Recent Labs: No results found for requested labs within last 365 days.  Recent Lipid Panel    Component Value Date/Time   CHOL 148 05/17/2021 0839   TRIG 66 05/17/2021 0839   HDL 66 05/17/2021 0839   CHOLHDL 2.2 05/17/2021 0839   LDLCALC 69 05/17/2021 0839    Physical Exam:    VS:  BP 136/80 (BP Location: Right Arm, Patient Position: Sitting)   Pulse 75   Ht 5' (1.524 m)   Wt 121 lb (54.9 kg)   SpO2 98%   BMI 23.63 kg/m     Wt Readings from Last 3 Encounters:  07/01/23 121 lb (54.9 kg)  05/06/22 137 lb 12.8 oz (62.5 kg)  05/24/21 131 lb (59.4 kg)     GEN: Patient is in no acute distress HEENT: Normal NECK: No JVD; No carotid bruits LYMPHATICS: No lymphadenopathy CARDIAC: Hear sounds regular, 2/6 systolic murmur at the apex. RESPIRATORY:  Clear to auscultation without rales, wheezing or rhonchi  ABDOMEN: Soft, non-tender, non-distended MUSCULOSKELETAL:  No edema; No deformity  SKIN: Warm and dry NEUROLOGIC:  Alert and oriented x 3 PSYCHIATRIC:  Normal affect   Signed, Garwin Brothers, MD  07/01/2023 1:42 PM    [facility]
